# Patient Record
Sex: Female | Born: 1970
Health system: Southern US, Community
[De-identification: ages and names within clinical notes are randomized; demographics above are authoritative.]

## PROBLEM LIST (undated history)

## (undated) DIAGNOSIS — M222X2 Patellofemoral disorders, left knee: Secondary | ICD-10-CM

## (undated) DIAGNOSIS — F4310 Post-traumatic stress disorder, unspecified: Secondary | ICD-10-CM

## (undated) DIAGNOSIS — M5020 Other cervical disc displacement, unspecified cervical region: Secondary | ICD-10-CM

## (undated) DIAGNOSIS — M222X1 Patellofemoral disorders, right knee: Secondary | ICD-10-CM

## (undated) DIAGNOSIS — M722 Plantar fascial fibromatosis: Secondary | ICD-10-CM

## (undated) DIAGNOSIS — O24419 Gestational diabetes mellitus in pregnancy, unspecified control: Secondary | ICD-10-CM

## (undated) DIAGNOSIS — G4730 Sleep apnea, unspecified: Secondary | ICD-10-CM

## (undated) DIAGNOSIS — R519 Headache, unspecified: Secondary | ICD-10-CM

## (undated) DIAGNOSIS — R51 Headache: Secondary | ICD-10-CM

## (undated) DIAGNOSIS — K219 Gastro-esophageal reflux disease without esophagitis: Secondary | ICD-10-CM

## (undated) DIAGNOSIS — M797 Fibromyalgia: Secondary | ICD-10-CM

## (undated) DIAGNOSIS — D219 Benign neoplasm of connective and other soft tissue, unspecified: Secondary | ICD-10-CM

## (undated) DIAGNOSIS — G43909 Migraine, unspecified, not intractable, without status migrainosus: Secondary | ICD-10-CM

## (undated) DIAGNOSIS — B999 Unspecified infectious disease: Secondary | ICD-10-CM

## (undated) DIAGNOSIS — G8929 Other chronic pain: Secondary | ICD-10-CM

## (undated) DIAGNOSIS — D649 Anemia, unspecified: Secondary | ICD-10-CM

## (undated) DIAGNOSIS — O139 Gestational [pregnancy-induced] hypertension without significant proteinuria, unspecified trimester: Secondary | ICD-10-CM

## (undated) DIAGNOSIS — F419 Anxiety disorder, unspecified: Secondary | ICD-10-CM

## (undated) HISTORY — PX: CHOLECYSTECTOMY: SHX55

## (undated) HISTORY — DX: Sleep apnea, unspecified: G47.30

## (undated) HISTORY — DX: Post-traumatic stress disorder, unspecified: F43.10

## (undated) HISTORY — PX: TONSILLECTOMY: SUR1361

## (undated) HISTORY — PX: APPENDECTOMY: SHX54

## (undated) HISTORY — DX: Patellofemoral disorders, left knee: M22.2X2

## (undated) HISTORY — PX: DILATION AND CURETTAGE OF UTERUS: SHX78

## (undated) HISTORY — DX: Gestational (pregnancy-induced) hypertension without significant proteinuria, unspecified trimester: O13.9

## (undated) HISTORY — DX: Plantar fascial fibromatosis: M72.2

## (undated) HISTORY — PX: LIPOSUCTION MULTIPLE BODY PARTS: SUR832

## (undated) HISTORY — DX: Anxiety disorder, unspecified: F41.9

## (undated) HISTORY — DX: Patellofemoral disorders, right knee: M22.2X1

---
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
## 2007-03-26 HISTORY — PX: BREAST REDUCTION SURGERY: SHX8

## 2008-03-25 DIAGNOSIS — D259 Leiomyoma of uterus, unspecified: Secondary | ICD-10-CM | POA: Insufficient documentation

## 2015-05-27 ENCOUNTER — Emergency Department (HOSPITAL_COMMUNITY)
Admission: EM | Admit: 2015-05-27 | Discharge: 2015-05-28 | Disposition: A | Discharge status: Home / Self Care | Payer: 59 | Attending: Emergency Medicine | Admitting: Emergency Medicine

## 2015-05-27 ENCOUNTER — Emergency Department (HOSPITAL_COMMUNITY): Payer: 59

## 2015-05-27 ENCOUNTER — Encounter (HOSPITAL_COMMUNITY): Payer: Self-pay

## 2015-05-27 DIAGNOSIS — Y9389 Activity, other specified: Secondary | ICD-10-CM | POA: Insufficient documentation

## 2015-05-27 DIAGNOSIS — Z8739 Personal history of other diseases of the musculoskeletal system and connective tissue: Secondary | ICD-10-CM | POA: Insufficient documentation

## 2015-05-27 DIAGNOSIS — R55 Syncope and collapse: Secondary | ICD-10-CM

## 2015-05-27 DIAGNOSIS — G8929 Other chronic pain: Secondary | ICD-10-CM | POA: Insufficient documentation

## 2015-05-27 DIAGNOSIS — W01198A Fall on same level from slipping, tripping and stumbling with subsequent striking against other object, initial encounter: Secondary | ICD-10-CM | POA: Insufficient documentation

## 2015-05-27 DIAGNOSIS — S0990XA Unspecified injury of head, initial encounter: Secondary | ICD-10-CM

## 2015-05-27 DIAGNOSIS — Y9289 Other specified places as the place of occurrence of the external cause: Secondary | ICD-10-CM | POA: Insufficient documentation

## 2015-05-27 DIAGNOSIS — Z8679 Personal history of other diseases of the circulatory system: Secondary | ICD-10-CM | POA: Insufficient documentation

## 2015-05-27 DIAGNOSIS — S022XXA Fracture of nasal bones, initial encounter for closed fracture: Secondary | ICD-10-CM

## 2015-05-27 DIAGNOSIS — Y998 Other external cause status: Secondary | ICD-10-CM | POA: Insufficient documentation

## 2015-05-27 HISTORY — DX: Other chronic pain: G89.29

## 2015-05-27 HISTORY — DX: Fibromyalgia: M79.7

## 2015-05-27 HISTORY — DX: Headache: R51

## 2015-05-27 HISTORY — DX: Headache, unspecified: R51.9

## 2015-05-27 LAB — I-STAT CHEM 8, ED
BUN: 19 mg/dL (ref 6–20)
Calcium, Ion: 1.18 mmol/L (ref 1.12–1.23)
Chloride: 102 mmol/L (ref 101–111)
Creatinine, Ser: 1 mg/dL (ref 0.44–1.00)
Glucose, Bld: 96 mg/dL (ref 65–99)
HCT: 41 % (ref 36.0–46.0)
Hemoglobin: 13.9 g/dL (ref 12.0–15.0)
Potassium: 3.8 mmol/L (ref 3.5–5.1)
Sodium: 140 mmol/L (ref 135–145)
TCO2: 25 mmol/L (ref 0–100)

## 2015-05-27 LAB — COMPREHENSIVE METABOLIC PANEL
ALT: 18 U/L (ref 14–54)
AST: 21 U/L (ref 15–41)
Albumin: 4.2 g/dL (ref 3.5–5.0)
Alkaline Phosphatase: 85 U/L (ref 38–126)
Anion gap: 13 (ref 5–15)
BUN: 18 mg/dL (ref 6–20)
CO2: 25 mmol/L (ref 22–32)
Calcium: 9.5 mg/dL (ref 8.9–10.3)
Chloride: 102 mmol/L (ref 101–111)
Creatinine, Ser: 1.03 mg/dL — ABNORMAL HIGH (ref 0.44–1.00)
GFR calc Af Amer: >60 mL/min (ref >60)
GFR calc non Af Amer: >60 mL/min (ref >60)
Glucose, Bld: 96 mg/dL (ref 65–99)
Potassium: 3.8 mmol/L (ref 3.5–5.1)
Sodium: 140 mmol/L (ref 135–145)
Total Bilirubin: 0.9 mg/dL (ref 0.3–1.2)
Total Protein: 7.3 g/dL (ref 6.5–8.1)

## 2015-05-27 LAB — CBC
HCT: 38.5 % (ref 36.0–46.0)
Hemoglobin: 12.7 g/dL (ref 12.0–15.0)
MCH: 28.7 pg (ref 26.0–34.0)
MCHC: 33 g/dL (ref 30.0–36.0)
MCV: 87.1 fL (ref 78.0–100.0)
Platelets: 302 10*3/uL (ref 150–400)
RBC: 4.42 MIL/uL (ref 3.87–5.11)
RDW: 14.1 % (ref 11.5–15.5)
WBC: 9.1 10*3/uL (ref 4.0–10.5)

## 2015-05-27 LAB — DIFFERENTIAL
Basophils Absolute: 0 10*3/uL (ref 0.0–0.1)
Basophils Relative: 0 %
Eosinophils Absolute: 0.1 10*3/uL (ref 0.0–0.7)
Eosinophils Relative: 1 %
Lymphocytes Relative: 15 %
Lymphs Abs: 1.4 10*3/uL (ref 0.7–4.0)
Monocytes Absolute: 0.6 10*3/uL (ref 0.1–1.0)
Monocytes Relative: 6 %
Neutro Abs: 7.1 10*3/uL (ref 1.7–7.7)
Neutrophils Relative %: 78 %

## 2015-05-27 LAB — I-STAT TROPONIN, ED: Troponin i, poc: 0 ng/mL (ref 0.00–0.08)

## 2015-05-27 LAB — PROTIME-INR
INR: 1.06 (ref 0.00–1.49)
Prothrombin Time: 14 seconds (ref 11.6–15.2)

## 2015-05-27 LAB — APTT: aPTT: 27 seconds (ref 24–37)

## 2015-05-27 MED ORDER — IBUPROFEN 800 MG PO TABS
800.0000 mg | ORAL_TABLET | Freq: Once | ORAL | Status: AC
  Administered 2015-05-27: 800 mg via ORAL
  Filled 2015-05-27: qty 1

## 2015-05-27 NOTE — ED Notes (Signed)
Pt returned from CT °

## 2015-05-27 NOTE — ED Notes (Signed)
EKG given to Dr. Delo 

## 2015-05-27 NOTE — ED Notes (Signed)
Pt ambulated to the bathroom with standby assistance, pt felt a little dizzy and also stated she had a headache

## 2015-05-27 NOTE — ED Notes (Signed)
Pt verbalized understanding of d/c instructions and has no further questions. Pt stable and NAD.  

## 2015-05-27 NOTE — ED Provider Notes (Signed)
CSN: [id number]     Arrival date & time 05/27/15  1922 History   First MD Initiated Contact with Patient 05/27/15 1935     Chief Complaint  Patient presents with  . Dizziness     (Consider location/radiation/quality/duration/timing/severity/associated sxs/prior Treatment) HPI Comments: Patient is a 45 year old female with history of fibromyalgia and migraine headaches. She presents for evaluation of dizzy episodes that of been occurring for the past several months. She experienced another episode this afternoon, then lost her balance, fell forward, and struck her face on the floor. She had bleeding from her nose which was eventually controlled with direct pressure. She is here complaining of headache and nose pain.  Patient is a 45 y.o. female presenting with dizziness. The history is provided by the patient.  Dizziness Quality:  Lightheadedness and imbalance Severity:  Moderate Timing:  Constant Progression:  Unchanged Chronicity:  Recurrent Relieved by:  Nothing Worsened by:  Nothing Ineffective treatments:  None tried   Past Medical History  Diagnosis Date  . Fibromyalgia   . Chronic headaches    Past Surgical History  Procedure Laterality Date  . Cholecystectomy    . Appendectomy     History reviewed. No pertinent family history. Social History  Substance Use Topics  . Smoking status: Never Smoker   . Smokeless tobacco: None  . Alcohol Use: No   OB History    No data available     Review of Systems  Neurological: Positive for dizziness.  All other systems reviewed and are negative.     Allergies  Review of patient's allergies indicates no known allergies.  Home Medications   Prior to Admission medications   Not on File   BP 140/99 mmHg  Pulse 109  Temp(Src) 97.8 F (36.6 C) (Oral)  Resp 16  Ht 5\' 5"  (1.651 m)  Wt 190 lb 12.8 oz (86.546 kg)  BMI 31.75 kg/m2  SpO2 99%  LMP 05/22/2015 Physical Exam  Constitutional: She is oriented to person,  place, and time. She appears well-developed and well-nourished. No distress.  HENT:  Head: Normocephalic and atraumatic.  Mouth/Throat: Oropharynx is clear and moist.  The septum is midline with no evidence for hematoma. There is a slight amount of residual blood in the left nares, however no active bleeding.  Eyes: EOM are normal. Pupils are equal, round, and reactive to light.  Neck: Normal range of motion. Neck supple.  Cardiovascular: Normal rate and regular rhythm.  Exam reveals no gallop and no friction rub.   No murmur heard. Pulmonary/Chest: Effort normal and breath sounds normal. No respiratory distress. She has no wheezes.  Abdominal: Soft. Bowel sounds are normal. She exhibits no distension. There is no tenderness.  Musculoskeletal: Normal range of motion.  Neurological: She is alert and oriented to person, place, and time. No cranial nerve deficit. She exhibits normal muscle tone. Coordination normal.  Skin: Skin is warm and dry. She is not diaphoretic.  Nursing note and vitals reviewed.   ED Course  Procedures (including critical care time) Labs Review Labs Reviewed  PROTIME-INR  APTT  CBC  DIFFERENTIAL  COMPREHENSIVE METABOLIC PANEL  I-STAT TROPOININ, ED  CBG MONITORING, ED  I-STAT CHEM 8, ED    Imaging Review No results found. I have personally reviewed and evaluated these images and lab results as part of my medical decision-making.   EKG Interpretation   Date/Time:  Saturday May 27 2015 19:35:01 EST Ventricular Rate:  111 PR Interval:  160 QRS Duration: 76 QT Interval:  336 QTC Calculation: 456 R Axis:   10 Text Interpretation:  Sinus tachycardia Low voltage QRS Borderline ECG  Confirmed by Wei Newbrough  MD, Maebel Marasco ([id number]) on 05/27/2015 7:41:50 PM      MDM   Final diagnoses:  None    Patient presents here with complaints of weakness and occasional dizzy episodes over the past several weeks. This evening she had another episode which caused her to pass  out. She struck her nose on the floor and has a nondisplaced nasal bone fracture. Her head CT is otherwise negative. Laboratory studies reveal no obvious cause for this. She is to follow-up with her primary Dr. at the [facility] to discuss further workup into these episodes.    Veryl Speak, MD 05/27/15 2337

## 2015-05-27 NOTE — Discharge Instructions (Signed)
Ibuprofen 600 g every 6 hours as needed for pain.  Follow-up with your primary Dr. to discuss these episodes, and return to the ER if symptoms significantly worsen or change.   Syncope Syncope is a medical term for fainting or passing out. This means you lose consciousness and drop to the ground. People are generally unconscious for less than 5 minutes. You may have some muscle twitches for up to 15 seconds before waking up and returning to normal. Syncope occurs more often in older adults, but it can happen to anyone. While most causes of syncope are not dangerous, syncope can be a sign of a serious medical problem. It is important to seek medical care.  CAUSES  Syncope is caused by a sudden drop in blood flow to the brain. The specific cause is often not determined. Factors that can bring on syncope include:  Taking medicines that lower blood pressure.  Sudden changes in posture, such as standing up quickly.  Taking more medicine than prescribed.  Standing in one place for too long.  Seizure disorders.  Dehydration and excessive exposure to heat.  Low blood sugar (hypoglycemia).  Straining to have a bowel movement.  Heart disease, irregular heartbeat, or other circulatory problems.  Fear, emotional distress, seeing blood, or severe pain. SYMPTOMS  Right before fainting, you may:  Feel dizzy or light-headed.  Feel nauseous.  See all white or all black in your field of vision.  Have cold, clammy skin. DIAGNOSIS  Your health care provider will ask about your symptoms, perform a physical exam, and perform an electrocardiogram (ECG) to record the electrical activity of your heart. Your health care provider may also perform other heart or blood tests to determine the cause of your syncope which may include:  Transthoracic echocardiogram (TTE). During echocardiography, sound waves are used to evaluate how blood flows through your heart.  Transesophageal echocardiogram  (TEE).  Cardiac monitoring. This allows your health care provider to monitor your heart rate and rhythm in real time.  Holter monitor. This is a portable device that records your heartbeat and can help diagnose heart arrhythmias. It allows your health care provider to track your heart activity for several days, if needed.  Stress tests by exercise or by giving medicine that makes the heart beat faster. TREATMENT  In most cases, no treatment is needed. Depending on the cause of your syncope, your health care provider may recommend changing or stopping some of your medicines. HOME CARE INSTRUCTIONS  Have someone stay with you until you feel stable.  Do not drive, use machinery, or play sports until your health care provider says it is okay.  Keep all follow-up appointments as directed by your health care provider.  Lie down right away if you start feeling like you might faint. Breathe deeply and steadily. Wait until all the symptoms have passed.  Drink enough fluids to keep your urine clear or pale yellow.  If you are taking blood pressure or heart medicine, get up slowly and take several minutes to sit and then stand. This can reduce dizziness. SEEK IMMEDIATE MEDICAL CARE IF:   You have a severe headache.  You have unusual pain in the chest, abdomen, or back.  You are bleeding from your mouth or rectum, or you have black or tarry stool.  You have an irregular or very fast heartbeat.  You have pain with breathing.  You have repeated fainting or seizure-like jerking during an episode.  You faint when sitting or lying down.  You have confusion.  You have trouble walking.  You have severe weakness.  You have vision problems. If you fainted, call your local emergency services (911 in [place] Do not drive yourself to the hospital.    This information is not intended to replace advice given to you by your health care provider. Make sure you discuss any questions you have with  your health care provider.   Document Released: 03/11/2005 Document Revised: 07/26/2014 Document Reviewed: 05/10/2011 Elsevier Interactive Patient Education 2016 Elsevier [employer]  Nasal Fracture A nasal fracture is a break or crack in the bones or cartilage of the nose. Minor breaks do not require treatment. These breaks usually heal on their own after about one month. Serious breaks may require surgery. CAUSES This injury is usually caused by a blunt injury to the nose. This type of injury often occurs from:  Contact sports.  Car accidents.  Falls.  Getting punched. SYMPTOMS Symptoms of this injury include:  Pain.  Swelling of the nose.  Bleeding from the nose.  Bruising around the nose or eyes. This may include having black eyes.  Crooked appearance of the nose. DIAGNOSIS This injury may be diagnosed with a physical exam. The health care provider will gently feel the nose for signs of broken bones. He or she will look inside the nostrils to make sure that there is not a blood-filled swelling on the dividing wall between the nostrils (septal hematoma). X-rays of the nose may not show a nasal fracture even when one is present. In some cases, X-rays or a CT scan may be done 1-5 days after the injury. Sometimes, the health care provider will want to wait until the swelling has gone down. TREATMENT Often, minor fractures that have caused no deformity do not require treatment. More serious fractures in which bones have moved out of position may require surgery, which will take place after the swelling is gone. Surgery will stabilize and align the fracture. In some cases, a health care provider may be able to reposition the bones without surgery. This may be done in the health care provider's office after medicine is given to numb the area (local anesthetic). HOME CARE INSTRUCTIONS  If directed, apply ice to the injured area:  Put ice in a plastic bag.  Place a towel between your skin  and the bag.  Leave the ice on for 20 minutes, 2-3 times per day.  Take over-the-counter and prescription medicines only as told by your health care provider.  If your nose starts to bleed, sit in an upright position while you squeeze the soft parts of your nose against the dividing wall between your nostrils (septum) for 10 minutes.  Try to avoid blowing your nose.  Return to your normal activities as told by your health care provider. Ask your health care provider what activities are safe for you.  Avoid contact sports for 3-4 weeks or as told by your health care provider.  Keep all follow-up visits as told by your health care provider. This is important. SEEK MEDICAL CARE IF:  Your pain increases or becomes severe.  You continue to have nosebleeds.  The shape of your nose does not return to normal within 5 days.  You have pus draining out of your nose. SEEK IMMEDIATE MEDICAL CARE IF:  You have bleeding from your nose that does not stop after you pinch your nostrils closed for 20 minutes and keep ice on your nose.  You have clear fluid draining out of  your nose.  You notice a grape-like swelling on the septum. This swelling is a collection of blood (hematoma) that must be drained to help prevent infection.  You have difficulty moving your eyes.  You have repeated vomiting.   This information is not intended to replace advice given to you by your health care provider. Make sure you discuss any questions you have with your health care provider.   Document Released: 03/08/2000 Document Revised: 11/30/2014 Document Reviewed: 04/18/2014 Elsevier Interactive Patient Education [employer].

## 2015-05-27 NOTE — ED Notes (Signed)
Pt reports onset 45 min PTA pt was in bathroom, got dizzy, fell and hit nose on floor, nose bled x 35 min with holding pressure.  Pt reports has had dizziness and headaches x several months, pt has been seen in [place] for same.   Hand grips equal, pt has normal gait, no facial grimace.

## 2016-10-17 ENCOUNTER — Emergency Department (HOSPITAL_COMMUNITY): Payer: No Typology Code available for payment source

## 2016-10-17 ENCOUNTER — Encounter (HOSPITAL_COMMUNITY): Payer: Self-pay

## 2016-10-17 ENCOUNTER — Emergency Department (HOSPITAL_COMMUNITY)
Admission: EM | Admit: 2016-10-17 | Discharge: 2016-10-17 | Disposition: A | Discharge status: Home / Self Care | Payer: No Typology Code available for payment source | Attending: Emergency Medicine | Admitting: Emergency Medicine

## 2016-10-17 DIAGNOSIS — R5383 Other fatigue: Secondary | ICD-10-CM | POA: Insufficient documentation

## 2016-10-17 DIAGNOSIS — R51 Headache: Secondary | ICD-10-CM | POA: Diagnosis present

## 2016-10-17 DIAGNOSIS — R079 Chest pain, unspecified: Secondary | ICD-10-CM | POA: Insufficient documentation

## 2016-10-17 DIAGNOSIS — R519 Headache, unspecified: Secondary | ICD-10-CM

## 2016-10-17 DIAGNOSIS — R0789 Other chest pain: Secondary | ICD-10-CM

## 2016-10-17 DIAGNOSIS — M79602 Pain in left arm: Secondary | ICD-10-CM | POA: Insufficient documentation

## 2016-10-17 DIAGNOSIS — R202 Paresthesia of skin: Secondary | ICD-10-CM | POA: Insufficient documentation

## 2016-10-17 DIAGNOSIS — R42 Dizziness and giddiness: Secondary | ICD-10-CM | POA: Diagnosis not present

## 2016-10-17 HISTORY — DX: Other cervical disc displacement, unspecified cervical region: M50.20

## 2016-10-17 LAB — CBC
HCT: 39.2 % (ref 36.0–46.0)
Hemoglobin: 13.4 g/dL (ref 12.0–15.0)
MCH: 29 pg (ref 26.0–34.0)
MCHC: 34.2 g/dL (ref 30.0–36.0)
MCV: 84.8 fL (ref 78.0–100.0)
Platelets: 290 10*3/uL (ref 150–400)
RBC: 4.62 MIL/uL (ref 3.87–5.11)
RDW: 14.4 % (ref 11.5–15.5)
WBC: 10.2 10*3/uL (ref 4.0–10.5)

## 2016-10-17 LAB — BASIC METABOLIC PANEL
Anion gap: 10 (ref 5–15)
BUN: 9 mg/dL (ref 6–20)
CO2: 25 mmol/L (ref 22–32)
Calcium: 9.2 mg/dL (ref 8.9–10.3)
Chloride: 101 mmol/L (ref 101–111)
Creatinine, Ser: 0.72 mg/dL (ref 0.44–1.00)
GFR calc Af Amer: >60 mL/min (ref >60)
GFR calc non Af Amer: >60 mL/min (ref >60)
Glucose, Bld: 99 mg/dL (ref 65–99)
Potassium: 3.8 mmol/L (ref 3.5–5.1)
Sodium: 136 mmol/L (ref 135–145)

## 2016-10-17 LAB — URINALYSIS, ROUTINE W REFLEX MICROSCOPIC
Bilirubin Urine: NEGATIVE
Glucose, UA: NEGATIVE mg/dL
Hgb urine dipstick: NEGATIVE
Ketones, ur: NEGATIVE mg/dL
Leukocytes, UA: NEGATIVE
Nitrite: NEGATIVE
Protein, ur: NEGATIVE mg/dL
Specific Gravity, Urine: 1.019 (ref 1.005–1.030)
pH: 5 (ref 5.0–8.0)

## 2016-10-17 LAB — I-STAT TROPONIN, ED: Troponin i, poc: 0 ng/mL (ref 0.00–0.08)

## 2016-10-17 MED ORDER — SODIUM CHLORIDE 0.9 % IV BOLUS (SEPSIS)
1000.0000 mL | Freq: Once | INTRAVENOUS | Status: AC
  Administered 2016-10-17: 1000 mL via INTRAVENOUS

## 2016-10-17 MED ORDER — PROCHLORPERAZINE EDISYLATE 5 MG/ML IJ SOLN
10.0000 mg | Freq: Once | INTRAMUSCULAR | Status: AC
  Administered 2016-10-17: 10 mg via INTRAVENOUS
  Filled 2016-10-17: qty 2

## 2016-10-17 MED ORDER — DIPHENHYDRAMINE HCL 50 MG/ML IJ SOLN
25.0000 mg | Freq: Once | INTRAMUSCULAR | Status: AC
  Administered 2016-10-17: 25 mg via INTRAVENOUS
  Filled 2016-10-17: qty 1

## 2016-10-17 MED ORDER — BUTALBITAL-APAP-CAFFEINE 50-325-40 MG PO TABS: 1.0000 | ORAL_TABLET | Freq: Four times a day (QID) | ORAL | 0 refills | Status: AC | PRN

## 2016-10-17 MED ORDER — KETOROLAC TROMETHAMINE 30 MG/ML IJ SOLN
30.0000 mg | Freq: Once | INTRAMUSCULAR | Status: AC
  Administered 2016-10-17: 30 mg via INTRAVENOUS
  Filled 2016-10-17: qty 1

## 2016-10-17 NOTE — Discharge Instructions (Signed)
Return here as needed.  Follow-up with your neurologist

## 2016-10-17 NOTE — ED Triage Notes (Addendum)
Per Pt, Pt is coming from home with complaints of head pressure, left chest pressure that radiates down her left arm, and right flank pain that has been going on for six months. Reports some nausea and dizziness with fatigue and loss of appetite. Pt reports some episodes of bilateral leg tingling. Pt is alert and oriented x4 upon arrival to the ED.

## 2016-10-29 NOTE — ED Provider Notes (Signed)
[facility] DEPT Provider Note   CSN: [id number] Arrival date & time: 10/17/16  1624     History   Chief Complaint Chief Complaint  Patient presents with  . Chest Pain  . Headache    HPI Melissa Medina is a 46 y.o. female.  HPI Patient presents to the emergency department with bilateral head pressure left chest pressure, left arm pain, right flank pain has been ongoing for about 6 months.  Patient also states she has had nausea with dizziness and fatigue, loss of appetite.  Patient states that tingling in both lower extremities at times.  Patient states that she has been referred to neurologist.  Patient states nothing seems make the condition better or worse.  She states she did not take any medications prior to arrival. The patient denies  shortness of breath, headache,blurred vision, neck pain, fever, cough, dizziness, anorexia, edema, abdominal pain, , vomiting, diarrhea, rash, back pain, dysuria, hematemesis, bloody stool, near syncope, or syncope. Past Medical History:  Diagnosis Date  . Chronic headaches   . Fibromyalgia   . Herniated disc, cervical     There are no active problems to display for this patient.   Past Surgical History:  Procedure Laterality Date  . APPENDECTOMY    . CHOLECYSTECTOMY      OB History    No data available       Home Medications    Prior to Admission medications   Medication Sig Start Date End Date Taking? Authorizing Provider  ibuprofen (ADVIL,MOTRIN) 200 MG tablet Take 200-400 mg by mouth every 6 (six) hours as needed (for headaches).   Yes [provider]  butalbital-acetaminophen-caffeine (FIORICET, ESGIC) [phone] MG tablet Take 1-2 tablets by mouth every 6 (six) hours as needed for headache. 10/17/16 10/17/17  Dalia Heading, PA-C    Family History No family history on file.  Social History Social History  Substance Use Topics  . Smoking status: Never Smoker  . Smokeless tobacco: Never Used  . Alcohol  use No     Allergies   Patient has no known allergies.   Review of Systems Review of Systems  All other systems negative except as documented in the HPI. All pertinent positives and negatives as reviewed in the HPI. Physical Exam Updated Vital Signs BP 120/86   Pulse 78   Temp 97.6 F (36.4 C) (Oral)   Resp 11   Ht 5\' 5"  (1.651 m)   Wt 83.9 kg (185 lb)   LMP 10/03/2016   SpO2 100%   BMI 30.79 kg/m   Physical Exam  Constitutional: She is oriented to person, place, and time. She appears well-developed and well-nourished. No distress.  HENT:  Head: Normocephalic and atraumatic.  Mouth/Throat: Oropharynx is clear and moist.  Eyes: Pupils are equal, round, and reactive to light.  Neck: Normal range of motion. Neck supple.  Cardiovascular: Normal rate, regular rhythm and normal heart sounds.  Exam reveals no gallop and no friction rub.   No murmur heard. Pulmonary/Chest: Effort normal and breath sounds normal. No respiratory distress. She has no wheezes.  Abdominal: Soft. Bowel sounds are normal. She exhibits no distension. There is no tenderness.  Neurological: She is alert and oriented to person, place, and time. No sensory deficit. She exhibits normal muscle tone. Coordination normal.  Skin: Skin is warm and dry. Capillary refill takes less than 2 seconds. No rash noted. No erythema.  Psychiatric: She has a normal mood and affect. Her behavior is normal.  Nursing note and  vitals reviewed.    ED Treatments / Results  Labs (all labs ordered are listed, but only abnormal results are displayed) Labs Reviewed  BASIC METABOLIC PANEL  CBC  URINALYSIS, ROUTINE W REFLEX MICROSCOPIC  I-STAT TROPONIN, ED    EKG  EKG Interpretation  Date/Time:  Thursday October 17 2016 16:42:20 EDT Ventricular Rate:  95 PR Interval:  156 QRS Duration: 78 QT Interval:  350 QTC Calculation: 439 R Axis:   -102 Text Interpretation:  Normal sinus rhythm Indeterminate axis Borderline ECG  Confirmed by Ripley Fraise [phone]) on 10/18/2016 10:32:00 AM       Radiology No results found.  Procedures Procedures (including critical care time)  Medications Ordered in ED Medications  sodium chloride 0.9 % bolus 1,000 mL (0 mLs Intravenous Stopped 10/17/16 2305)  ketorolac (TORADOL) 30 MG/ML injection 30 mg (30 mg Intravenous Given 10/17/16 2210)  prochlorperazine (COMPAZINE) injection 10 mg (10 mg Intravenous Given 10/17/16 2218)  diphenhydrAMINE (BENADRYL) injection 25 mg (25 mg Intravenous Given 10/17/16 2217)     Initial Impression / Assessment and Plan / ED Course  I have reviewed the triage vital signs and the nursing notes.  Pertinent labs & imaging results that were available during my care of the patient were reviewed by me and considered in my medical decision making (see chart for details).    Patient will need neurological assessment is an outpatient.  She will most likely need further evaluation of her multiple complaints is been ongoing for over 6 months.  Patient is advised that this could be related to several different neurological conditions.  I did advise her she needed to return here for any worsening in her condition.  Patient agrees the plan and all questions were answered  Final Clinical Impressions(s) / ED Diagnoses   Final diagnoses:  Bad headache  Atypical chest pain  Dizziness    New Prescriptions Discharge Medication List as of 10/17/2016 10:54 PM    START taking these medications   Details  butalbital-acetaminophen-caffeine (FIORICET, ESGIC) 50-325-40 MG tablet Take 1-2 tablets by mouth every 6 (six) hours as needed for headache., Starting Thu 10/17/2016, Until Fri 10/17/2017, Print         Ticara Waner, [place], PA-C 10/29/16 0123    Charlesetta Shanks, MD 11/10/16 1712

## 2016-11-13 ENCOUNTER — Encounter (HOSPITAL_COMMUNITY): Payer: Self-pay | Admitting: Emergency Medicine

## 2016-11-13 ENCOUNTER — Ambulatory Visit (HOSPITAL_COMMUNITY)
Admission: EM | Admit: 2016-11-13 | Discharge: 2016-11-13 | Disposition: A | Discharge status: Home / Self Care | Payer: No Typology Code available for payment source | Attending: Family Medicine | Admitting: Family Medicine

## 2016-11-13 DIAGNOSIS — S61209A Unspecified open wound of unspecified finger without damage to nail, initial encounter: Secondary | ICD-10-CM

## 2016-11-13 MED ORDER — IBUPROFEN 200 MG PO TABS: 200.0000 mg | ORAL_TABLET | Freq: Four times a day (QID) | ORAL | 0 refills | Status: DC | PRN

## 2016-11-13 NOTE — ED Provider Notes (Signed)
  Page   [id number] 11/13/16 Arrival Time: 0301  ASSESSMENT & PLAN:  1. Fingertip avulsion, initial encounter     Meds ordered this encounter  Medications  . ibuprofen (ADVIL,MOTRIN) 200 MG tablet    Sig: Take 1-2 tablets (200-400 mg total) by mouth every 6 (six) hours as needed for moderate pain.    Dispense:  30 tablet    Refill:  0   Surgicel applied to fingertip after cleaning. Pressure bandage. Will remove in 24 hours at home. She may return here if she desires for ust to remove. Minimal bleeding in office. OTC analgesics as needed. Work note given with restrictions.  Reviewed expectations re: course of current medical issues. Questions answered. Outlined signs and symptoms indicating need for more acute intervention. Patient verbalized understanding. After Visit Summary given.   SUBJECTIVE:  Melissa Medina is a 46 y.o. female who presents with a R 5th fingertip avulsion. Cutting vegetables this morning and sliced fingertip. Moderate discomfort - "throbbing". Trouble getting the bleeding to stop.  Td UTD: Will check her [facility] records.  ROS: As per HPI.   OBJECTIVE:  Vitals:   11/13/16 1654  BP: 131/83  Pulse: 81  Resp: 16  Temp: 98.3 F (36.8 C)  TempSrc: Oral  SpO2: 97%  Weight: 189 lb (85.7 kg)     General appearance: alert; no distress Skin: R 5th fingertip with approx 6-60mm avulsion; no nail involvement; no exposed bone; minimal bleeding Psychological:  alert and cooperative; normal mood and affect   No Known Allergies  Past Medical History:  Diagnosis Date  . Chronic headaches   . Fibromyalgia   . Herniated disc, cervical        Vanessa Kick, MD 11/15/16 1215

## 2016-11-13 NOTE — ED Triage Notes (Signed)
PT was slicing vegetables with a machine this morning and sliced her right fifth finger. This happened at 0730.PT reports bleeding has not stopped since this AM.

## 2017-01-08 DIAGNOSIS — G894 Chronic pain syndrome: Secondary | ICD-10-CM | POA: Insufficient documentation

## 2017-11-21 DIAGNOSIS — F419 Anxiety disorder, unspecified: Secondary | ICD-10-CM | POA: Insufficient documentation

## 2017-11-21 DIAGNOSIS — M797 Fibromyalgia: Secondary | ICD-10-CM | POA: Insufficient documentation

## 2017-11-28 DIAGNOSIS — Z8659 Personal history of other mental and behavioral disorders: Secondary | ICD-10-CM | POA: Insufficient documentation

## 2018-06-15 ENCOUNTER — Other Ambulatory Visit: Payer: Self-pay | Admitting: Family Medicine

## 2018-06-15 DIAGNOSIS — Z0189 Encounter for other specified special examinations: Secondary | ICD-10-CM

## 2018-07-16 ENCOUNTER — Ambulatory Visit (HOSPITAL_COMMUNITY): Payer: No Typology Code available for payment source

## 2018-07-16 ENCOUNTER — Encounter (HOSPITAL_COMMUNITY): Payer: Self-pay

## 2018-08-10 ENCOUNTER — Emergency Department (HOSPITAL_COMMUNITY): Payer: No Typology Code available for payment source

## 2018-08-10 ENCOUNTER — Encounter (HOSPITAL_COMMUNITY): Payer: Self-pay

## 2018-08-10 ENCOUNTER — Other Ambulatory Visit: Payer: Self-pay

## 2018-08-10 ENCOUNTER — Emergency Department (HOSPITAL_COMMUNITY)
Admission: EM | Admit: 2018-08-10 | Discharge: 2018-08-10 | Disposition: A | Discharge status: Home / Self Care | Payer: No Typology Code available for payment source | Attending: Emergency Medicine | Admitting: Emergency Medicine

## 2018-08-10 DIAGNOSIS — D219 Benign neoplasm of connective and other soft tissue, unspecified: Secondary | ICD-10-CM | POA: Diagnosis not present

## 2018-08-10 DIAGNOSIS — N938 Other specified abnormal uterine and vaginal bleeding: Secondary | ICD-10-CM | POA: Diagnosis present

## 2018-08-10 DIAGNOSIS — R102 Pelvic and perineal pain: Secondary | ICD-10-CM

## 2018-08-10 LAB — I-STAT BETA HCG BLOOD, ED (MC, WL, AP ONLY): I-stat hCG, quantitative: <5 m[IU]/mL (ref <5)

## 2018-08-10 LAB — COMPREHENSIVE METABOLIC PANEL
ALT: 24 U/L (ref 0–44)
AST: 23 U/L (ref 15–41)
Albumin: 3.8 g/dL (ref 3.5–5.0)
Alkaline Phosphatase: 89 U/L (ref 38–126)
Anion gap: 10 (ref 5–15)
BUN: 9 mg/dL (ref 6–20)
CO2: 23 mmol/L (ref 22–32)
Calcium: 8.9 mg/dL (ref 8.9–10.3)
Chloride: 104 mmol/L (ref 98–111)
Creatinine, Ser: 0.64 mg/dL (ref 0.44–1.00)
GFR calc Af Amer: >60 mL/min (ref >60)
GFR calc non Af Amer: >60 mL/min (ref >60)
Glucose, Bld: 92 mg/dL (ref 70–99)
Potassium: 3.6 mmol/L (ref 3.5–5.1)
Sodium: 137 mmol/L (ref 135–145)
Total Bilirubin: 0.8 mg/dL (ref 0.3–1.2)
Total Protein: 6.6 g/dL (ref 6.5–8.1)

## 2018-08-10 LAB — CBC WITH DIFFERENTIAL/PLATELET
Abs Immature Granulocytes: 0.02 10*3/uL (ref 0.00–0.07)
Basophils Absolute: 0 10*3/uL (ref 0.0–0.1)
Basophils Relative: 1 %
Eosinophils Absolute: 0.2 10*3/uL (ref 0.0–0.5)
Eosinophils Relative: 2 %
HCT: 37.9 % (ref 36.0–46.0)
Hemoglobin: 12 g/dL (ref 12.0–15.0)
Immature Granulocytes: 0 %
Lymphocytes Relative: 27 %
Lymphs Abs: 2.2 10*3/uL (ref 0.7–4.0)
MCH: 26.3 pg (ref 26.0–34.0)
MCHC: 31.7 g/dL (ref 30.0–36.0)
MCV: 82.9 fL (ref 80.0–100.0)
Monocytes Absolute: 0.6 10*3/uL (ref 0.1–1.0)
Monocytes Relative: 7 %
Neutro Abs: 5.1 10*3/uL (ref 1.7–7.7)
Neutrophils Relative %: 63 %
Platelets: 300 10*3/uL (ref 150–400)
RBC: 4.57 MIL/uL (ref 3.87–5.11)
RDW: 15.8 % — ABNORMAL HIGH (ref 11.5–15.5)
WBC: 8.1 10*3/uL (ref 4.0–10.5)
nRBC: 0 % (ref 0.0–0.2)

## 2018-08-10 LAB — TYPE AND SCREEN
ABO/RH(D): O POS
Antibody Screen: NEGATIVE

## 2018-08-10 LAB — ABO/RH: ABO/RH(D): O POS

## 2018-08-10 MED ORDER — MEGESTROL ACETATE 40 MG PO TABS: 40.0000 mg | ORAL_TABLET | Freq: Every day | ORAL | 0 refills | Status: DC

## 2018-08-10 NOTE — ED Notes (Addendum)
Pt brought to exam room from Ultrasound.

## 2018-08-10 NOTE — Discharge Instructions (Signed)
Please take the medications prescribed to see if it helps stop the bleeding.  We recommend that you follow-up with a gynecologist in 7 days.  1 to the ER immediately if he start having worsening of the bleeding, or you start having episodes of dizziness, shortness of breath.

## 2018-08-10 NOTE — ED Triage Notes (Signed)
Pt reports heavy vaginal bleeding since May 10th. Reports large clots, changing her pad 8-9 times a day. Intermittent dizziness. Pt alert, nad noted

## 2018-08-10 NOTE — ED Provider Notes (Signed)
[facility] EMERGENCY DEPARTMENT Provider Note   CSN: [id number] Arrival date & time: 08/10/18  1511    History   Chief Complaint Chief Complaint  Patient presents with   Vaginal Bleeding    HPI Melissa Medina is a 48 y.o. female.     HPI  48 year old female comes in with chief complaint of vaginal bleeding.  Patient reports that for the last 7 to 10 days she has been having persistent bleeding.  She has been changing about 5-10 pads a day, which is normal for her to do on her heaviest day.  Her LMP is in mid March.  She did not have a period in April.  She denies any pelvic pain.  Patient has not sexually active for the last 3 months.  Past Medical History:  Diagnosis Date   Chronic headaches    Fibromyalgia    Herniated disc, cervical     There are no active problems to display for this patient.   Past Surgical History:  Procedure Laterality Date   APPENDECTOMY     CHOLECYSTECTOMY       OB History   No obstetric history on file.      Home Medications    Prior to Admission medications   Medication Sig Start Date End Date Taking? Authorizing Provider  ibuprofen (ADVIL,MOTRIN) 200 MG tablet Take 1-2 tablets (200-400 mg total) by mouth every 6 (six) hours as needed for moderate pain. 11/13/16   Vanessa Kick, MD    Family History No family history on file.  Social History Social History   Tobacco Use   Smoking status: Never Smoker   Smokeless tobacco: Never Used  Substance Use Topics   Alcohol use: No   Drug use: No     Allergies   Patient has no known allergies.   Review of Systems Review of Systems  Constitutional: Positive for activity change.  Respiratory: Negative for shortness of breath.   Genitourinary: Positive for vaginal bleeding.  Neurological: Negative for dizziness and syncope.  Hematological: Does not bruise/bleed easily.     Physical Exam Updated Vital Signs BP 116/75 (BP Location: Left Arm)     Pulse 80    Temp 98.2 F (36.8 C) (Oral)    Resp 16    SpO2 100%   Physical Exam Vitals signs and nursing note reviewed.  Constitutional:      Appearance: She is well-developed.  HENT:     Head: Normocephalic and atraumatic.  Neck:     Musculoskeletal: Normal range of motion and neck supple.  Cardiovascular:     Rate and Rhythm: Normal rate.  Pulmonary:     Effort: Pulmonary effort is normal.  Abdominal:     General: Bowel sounds are normal.  Skin:    General: Skin is warm and dry.  Neurological:     Mental Status: She is alert and oriented to person, place, and time.      ED Treatments / Results  Labs (all labs ordered are listed, but only abnormal results are displayed) Labs Reviewed  CBC WITH DIFFERENTIAL/PLATELET - Abnormal; Notable for the following components:      Result Value   RDW 15.8 (*)    All other components within normal limits  COMPREHENSIVE METABOLIC PANEL  I-STAT BETA HCG BLOOD, ED ([facility], [facility], [facility] ONLY)  TYPE AND SCREEN  ABO/RH    EKG None  Radiology US Pelvic Complete With Transvaginal  Result Date: 08/10/2018 CLINICAL DATA:  Pelvic pain and vaginal  bleeding for the past 2 weeks. EXAM: TRANSABDOMINAL AND TRANSVAGINAL ULTRASOUND OF PELVIS TECHNIQUE: Both transabdominal and transvaginal ultrasound examinations of the pelvis were performed. Transabdominal technique was performed for global imaging of the pelvis including uterus, ovaries, adnexal regions, and pelvic cul-de-sac. It was necessary to proceed with endovaginal exam following the transabdominal exam to visualize the ovaries. COMPARISON:  None. FINDINGS: Uterus Measurements: 9.9 x 5.9 x 6.8 cm = volume: 208 mL. Large 5.9 x 6.4 x 6.6 cm predominantly intramural fibroid at the uterine fundus with small submucosal component. Smaller intramural fibroid in the left posterior fundus, measuring 2.1 x 2.3 x 2.4 cm. Endometrium Thickness: 14 mm.  3 mm hyperechoic focus in the endometrial canal. Right ovary  Measurements: 2.0 x 1.4 x 1.8 cm = volume: 3 mL. Normal appearance/no adnexal mass. Left ovary Measurements: 4.4 x 3.4 x 2.8 cm = volume: 22 mL. Normal appearance/no adnexal mass. Two dominant follicles noted. Other findings No abnormal free fluid. IMPRESSION: 1. Fibroid uterus. 5.9 x 6.4 x 6.6 cm fundal fibroid appears to have a small submucosal component. 2. 3 mm hyperechoic focus within the endometrial canal could represent a polyp. Consider sonohysterogram for further evaluation, prior to hysteroscopy or endometrial biopsy. Electronically Signed   By: Titus Dubin M.D.   On: 08/10/2018 22:09    Procedures Procedures (including critical care time)  Medications Ordered in ED Medications - No data to display   Initial Impression / Assessment and Plan / ED Course  I have reviewed the triage vital signs and the nursing notes.  Pertinent labs & imaging results that were available during my care of the patient were reviewed by me and considered in my medical decision making (see chart for details).        48 year old comes in a chief complaint of vaginal bleeding.  Ultrasound does not reveal any evidence of tumors.  She likely is having bleeding because of fibroids which are appreciated on the ultrasound.  Patient denies any recent sexual intercourse and does not have any high risk sexual behavior.  There is no abdominal pain and pelvic exam was deferred.  Patient will be started on 1 week course of Megace and advised to follow-up with Gyne.  Final Clinical Impressions(s) / ED Diagnoses   Final diagnoses:  DUB (dysfunctional uterine bleeding)  Fibroid    ED Discharge Orders    None       Varney Biles, MD 08/11/18 2216

## 2018-08-27 ENCOUNTER — Ambulatory Visit (HOSPITAL_COMMUNITY)
Admission: RE | Admit: 2018-08-27 | Discharge: 2018-08-27 | Disposition: A | Discharge status: Home / Self Care | Payer: No Typology Code available for payment source | Source: Ambulatory Visit | Attending: Family Medicine | Admitting: Family Medicine

## 2018-08-27 ENCOUNTER — Other Ambulatory Visit: Payer: Self-pay

## 2018-08-27 DIAGNOSIS — Z0189 Encounter for other specified special examinations: Secondary | ICD-10-CM | POA: Insufficient documentation

## 2018-09-04 ENCOUNTER — Other Ambulatory Visit: Payer: Self-pay

## 2018-09-04 ENCOUNTER — Ambulatory Visit (HOSPITAL_COMMUNITY)
Admission: EM | Admit: 2018-09-04 | Discharge: 2018-09-04 | Disposition: A | Discharge status: Home / Self Care | Payer: No Typology Code available for payment source | Attending: Family Medicine | Admitting: Family Medicine

## 2018-09-04 ENCOUNTER — Encounter (HOSPITAL_COMMUNITY): Payer: Self-pay

## 2018-09-04 DIAGNOSIS — D219 Benign neoplasm of connective and other soft tissue, unspecified: Secondary | ICD-10-CM | POA: Diagnosis not present

## 2018-09-04 LAB — CBC
HCT: 36.9 % (ref 36.0–46.0)
Hemoglobin: 12.1 g/dL (ref 12.0–15.0)
MCH: 26 pg (ref 26.0–34.0)
MCHC: 32.8 g/dL (ref 30.0–36.0)
MCV: 79.2 fL — ABNORMAL LOW (ref 80.0–100.0)
Platelets: 390 10*3/uL (ref 150–400)
RBC: 4.66 MIL/uL (ref 3.87–5.11)
RDW: 15.9 % — ABNORMAL HIGH (ref 11.5–15.5)
WBC: 10.1 10*3/uL (ref 4.0–10.5)
nRBC: 0 % (ref 0.0–0.2)

## 2018-09-04 MED ORDER — MEGESTROL ACETATE 40 MG PO TABS: 40.0000 mg | ORAL_TABLET | Freq: Every day | ORAL | 0 refills | Status: DC

## 2018-09-04 MED ORDER — HYDROXYZINE HCL 25 MG PO TABS: 25.0000 mg | ORAL_TABLET | Freq: Four times a day (QID) | ORAL | 0 refills | Status: DC

## 2018-09-04 NOTE — ED Triage Notes (Signed)
Patient presents to Urgent Care with complaints of continued vaginal bleeding since being seen in the ED 3 weeks ago for same. Patient reports she had an ultrasound completed and was told that she had fibroids, was given a medication to stop the bleeding, and to come to UC if the bleeding returned. Pt sates the bleeding returned last night.  Pt also endorses loss of appetite for the past 4 days.

## 2018-09-04 NOTE — ED Provider Notes (Signed)
[facility]    CSN: [id number] Arrival date & time: 09/04/18  1341     History   Chief Complaint Chief Complaint  Patient presents with  . Vaginal Bleeding  . Anorexia    HPI Melissa Medina is a 48 y.o. female.   Patient is a 48 year old female presents today with vaginal bleeding.  She has similar episode to this 3 weeks ago and was seen in the ED and diagnosed with fibroids.  They started her on Megace which helped stop the bleeding for approximately 2 weeks and now it has returned.  The bleeding is heavy.  She has had some dark blood and clots.  Some mild cramping in the lower abdomen.  She has had mild loss of appetite, weakness and fatigue.  She is not sleeping.  Reports she has been anxious. She has not followed up with OB/GYN at this point.  No history of anemia.  ROS per HPI      Past Medical History:  Diagnosis Date  . Chronic headaches   . Fibromyalgia   . Herniated disc, cervical     There are no active problems to display for this patient.   Past Surgical History:  Procedure Laterality Date  . APPENDECTOMY    . CHOLECYSTECTOMY      OB History   No obstetric history on file.      Home Medications    Prior to Admission medications   Medication Sig Start Date End Date Taking? Authorizing Provider  hydrOXYzine (ATARAX/VISTARIL) 25 MG tablet Take 1 tablet (25 mg total) by mouth every 6 (six) hours. 09/04/18   Loura Halt A, NP  ibuprofen (ADVIL,MOTRIN) 200 MG tablet Take 1-2 tablets (200-400 mg total) by mouth every 6 (six) hours as needed for moderate pain. 11/13/16   Vanessa Kick, MD  megestrol (MEGACE) 40 MG tablet Take 1 tablet (40 mg total) by mouth daily. 09/04/18   Orvan July, NP    Family History Family History  Problem Relation Age of Onset  . Hypertension Mother   . Diabetes Mother   . Kidney failure Mother   . Healthy Father     Social History Social History   Tobacco Use  . Smoking status: Never Smoker  .  Smokeless tobacco: Never Used  Substance Use Topics  . Alcohol use: No  . Drug use: No     Allergies   Patient has no known allergies.   Review of Systems Review of Systems   Physical Exam Triage Vital Signs ED Triage Vitals  Enc Vitals Group     BP 09/04/18 1412 (!) 129/92     Pulse Rate 09/04/18 1412 (!) 113     Resp 09/04/18 1412 18     Temp 09/04/18 1412 98.9 F (37.2 C)     Temp Source 09/04/18 1412 Oral     SpO2 09/04/18 1412 100 %     Weight --      Height --      Head Circumference --      Peak Flow --      Pain Score 09/04/18 1411 0     Pain Loc --      Pain Edu? --      Excl. in [place]? --    No data found.  Updated Vital Signs BP (!) 129/92 (BP Location: Left Arm)   Pulse (!) 113   Temp 98.9 F (37.2 C) (Oral)   Resp 18   SpO2 100%   Visual  Acuity Right Eye Distance:   Left Eye Distance:   Bilateral Distance:    Right Eye Near:   Left Eye Near:    Bilateral Near:     Physical Exam Vitals signs and nursing note reviewed.  Constitutional:      General: She is not in acute distress.    Appearance: Normal appearance. She is not ill-appearing, toxic-appearing or diaphoretic.  HENT:     Head: Normocephalic and atraumatic.     Nose: Nose normal.     Mouth/Throat:     Pharynx: Oropharynx is clear.  Eyes:     Conjunctiva/sclera: Conjunctivae normal.  Neck:     Musculoskeletal: Normal range of motion.  Cardiovascular:     Rate and Rhythm: Tachycardia present.  Pulmonary:     Effort: Pulmonary effort is normal.     Breath sounds: Normal breath sounds.  Musculoskeletal: Normal range of motion.  Skin:    General: Skin is warm and dry.  Neurological:     Mental Status: She is alert.  Psychiatric:        Mood and Affect: Mood normal.      UC Treatments / Results  Labs (all labs ordered are listed, but only abnormal results are displayed) Labs Reviewed  CBC    EKG None  Radiology No results found.  Procedures Procedures  (including critical care time)  Medications Ordered in UC Medications - No data to display  Initial Impression / Assessment and Plan / UC Course  I have reviewed the triage vital signs and the nursing notes.  Pertinent labs & imaging results that were available during my care of the patient were reviewed by me and considered in my medical decision making (see chart for details).     Patient with known fibroids and bleeding that has returned and is heavy. Checking CBC for anemia based on symptoms and tachycardia Restarting the Megace Hydroxyzine to use as needed for anxiety and to help her rest at night. Contacts put on her discharge instructions for OB/GYN follow-up If symptoms continue or worsen she will need to go back to the hospital. Final Clinical Impressions(s) / UC Diagnoses   Final diagnoses:  Fibroids     Discharge Instructions     We will restart the Megace for bleeding Checking your blood to make sure you are not anemic. I am giving you contact for OB/GYN follow-up.  Make sure you call them to schedule today Hydroxyzine as needed to help with anxiety and rest Follow up as needed for continued or worsening symptoms     ED Prescriptions    Medication Sig Dispense Auth. Provider   megestrol (MEGACE) 40 MG tablet Take 1 tablet (40 mg total) by mouth daily. 7 tablet Whitnie Deleon A, NP   hydrOXYzine (ATARAX/VISTARIL) 25 MG tablet Take 1 tablet (25 mg total) by mouth every 6 (six) hours. 12 tablet Loura Halt A, NP     Controlled Substance Prescriptions [place] Controlled Substance Registry consulted? Not Applicable   Orvan July, NP 09/04/18 1517

## 2018-09-04 NOTE — Discharge Instructions (Addendum)
We will restart the Megace for bleeding Checking your blood to make sure you are not anemic. I am giving you contact for OB/GYN follow-up.  Make sure you call them to schedule today Hydroxyzine as needed to help with anxiety and rest Follow up as needed for continued or worsening symptoms

## 2018-09-07 ENCOUNTER — Telehealth (HOSPITAL_COMMUNITY): Payer: Self-pay | Admitting: Emergency Medicine

## 2018-09-07 NOTE — Telephone Encounter (Signed)
Normal labs per traci, Attempted to reach patient. No answer at this time.

## 2018-09-30 ENCOUNTER — Ambulatory Visit (HOSPITAL_COMMUNITY)
Payer: No Typology Code available for payment source | Attending: Obstetrics and Gynecology | Admitting: Obstetrics and Gynecology

## 2018-09-30 ENCOUNTER — Other Ambulatory Visit: Payer: Self-pay

## 2018-09-30 DIAGNOSIS — Z3169 Encounter for other general counseling and advice on procreation: Secondary | ICD-10-CM

## 2018-09-30 NOTE — Progress Notes (Signed)
Maternal-Fetal Medicine [facility] Consultation)  Referring Provider:  Dr. Neill Loft, MD Reproductive Endocrinologist [facility]  [address] [place], [place], [address]  Patient's Name: Melissa Medina DOB: [birth year] MRN: [id number]  I had the pleasure of seeing Melissa Medina today. She had a webex consultation. I verified her date of birth and showed my ID.  Melissa Medina is 52-years' old G2 P2 and is planning to conceive by fertility treatment. She  had preconception consultation today.  Obstetric history: -1991: Term vaginal delivery in [place] of a female infant weighing 7.5 lbs at birth.  -1993: Term vaginal delivery in [place] of a female infant weighing around 7.5 lbs at birth. Patient reports both her pregnancies were uncomplicated and that her daughters are in good health.  Gyn history: History of abnormal Pap smear in 2001 and she underwent cervical surgery (under anesthesia), possibly cold-knife cone biopsy (?). She does not have breast disease. Patient has initiated fertility treatment with you at [place], [place].   PMH: No history of hypertension or diabetes or any other chronic medical conditions. She does not have HIV or hepatitis B infection or any other infectious disease. PSH: Appendectomy, tonsillectomy, laparoscopic cholecystectomy, breast reduction. Medications: Prenatal vitamins. Allergies: NKDA. Social: Denies tobacco or drug or alcohol use. She has been married 15 years and her husband is in good health. He is not the father of her previous children. He does not have children from previous relationship, if any. Patient works in [employer]. Family: Mother died of complications of diabetes and hypertension that included renal failure. Father was healthy before his natural death. No history of venous thromboembolism in the family.  We discussed the following at her preconception consultation: Fertility treatment: -Patient reports she would like to  conceive with her own eggs. Donor-egg is a possibility only if fertility treatment fails with her own eggs.  -I counseled her that advanced maternal age is associated with increased risks of fetal aneuploidies. Early miscarriages are also more common and it may impact successful fertility treatment. I discussed available screening methods for fetal aneuploidies in pregnancy including cell-free fetal DNA screening. Only invasive testing-chorionic villus sampling or amniocentesis will give a definitive result on the fetal karyotype.  -Preimplantation genetic diagnosis may be possible, which can be discussed with her fertility specialist. However, it has limitations and does not detect all chromosomal anomalies.  -Patient had questions about transfer of more than one embryo. I counseled her that multiple pregnancies including monochorionic twins are more frequent with fertility treatment. High-order multiple gestations (triplets and above) can occur. Patient may be faced with a choice of multifetal pregnancy reduction to improve outcomes.  -Patient is aware of increased successful outcome with donor-egg IVF.  -Her husband is yet to undergo semen analysis.   Risk factors: -Patient does not have any medical conditions that can lead to complications in pregnancy. She does not have hypertension or diabetes. She is a healthy woman and one would expect good pregnancy outcome if successful. -Risk of stillbirth is slightly higher in the third trimester because of advanced maternal age. We recommend weekly antenatal testing from 36 weeks till delivery.  History of cervical surgery: -I am unable to ascertain the nature of cervical surgery performed in 2001 following abnormal Pap smear. Cold-knife cone biopsy procedure is associated with increased risk of cervical incompetence and mid-trimester losses and preterm deliveries. Screening cervical length measurement should be performed at 18 to 20 weeks' gestation to  identify cervical incompetence. Prophylactic cerclage is not  indicated.  Preconception folic acid: -I discussed the benefit of preconception folic acid in reducing the likelihood of open-neural tube defects in the fetus. Ideally, it should be taken at least one month before planned conception. Prenatal vitamins has sufficient daily requirement of folic acid and I encouraged her to continue prenatal vitamins.  Recommendations: -Continue prenatal vitamins or folic acid (790 micrograms daily). -Discuss with fertility specialist on the options of donor-egg. -Patient was counseled on the possibility of high-order multiple gestations whose likelihood may be decreased with choice of appropriate fertility treatment. -Screening of patient/donor/husband before fertility treatment as per protocol. -MFM consultation in early pregnancy.  Thank you for consultation. Face-to-face counseling 30 minutes.  [place], [facility]

## 2019-03-23 DIAGNOSIS — F4329 Adjustment disorder with other symptoms: Secondary | ICD-10-CM | POA: Insufficient documentation

## 2019-03-23 DIAGNOSIS — M25569 Pain in unspecified knee: Secondary | ICD-10-CM | POA: Insufficient documentation

## 2019-03-23 DIAGNOSIS — S0500XA Injury of conjunctiva and corneal abrasion without foreign body, unspecified eye, initial encounter: Secondary | ICD-10-CM | POA: Insufficient documentation

## 2019-03-23 DIAGNOSIS — D500 Iron deficiency anemia secondary to blood loss (chronic): Secondary | ICD-10-CM

## 2019-03-23 DIAGNOSIS — E559 Vitamin D deficiency, unspecified: Secondary | ICD-10-CM | POA: Insufficient documentation

## 2019-03-23 DIAGNOSIS — N6011 Diffuse cystic mastopathy of right breast: Secondary | ICD-10-CM | POA: Insufficient documentation

## 2019-03-23 DIAGNOSIS — N644 Mastodynia: Secondary | ICD-10-CM | POA: Insufficient documentation

## 2019-03-23 DIAGNOSIS — Z87410 Personal history of cervical dysplasia: Secondary | ICD-10-CM | POA: Insufficient documentation

## 2019-03-23 DIAGNOSIS — G4710 Hypersomnia, unspecified: Secondary | ICD-10-CM | POA: Insufficient documentation

## 2019-03-23 DIAGNOSIS — S069X0A Unspecified intracranial injury without loss of consciousness, initial encounter: Secondary | ICD-10-CM | POA: Insufficient documentation

## 2019-03-23 DIAGNOSIS — Z9889 Other specified postprocedural states: Secondary | ICD-10-CM

## 2019-03-23 DIAGNOSIS — F32A Depression, unspecified: Secondary | ICD-10-CM | POA: Insufficient documentation

## 2019-03-23 DIAGNOSIS — N939 Abnormal uterine and vaginal bleeding, unspecified: Secondary | ICD-10-CM

## 2019-03-23 DIAGNOSIS — G8929 Other chronic pain: Secondary | ICD-10-CM | POA: Insufficient documentation

## 2019-03-23 DIAGNOSIS — R059 Cough, unspecified: Secondary | ICD-10-CM | POA: Insufficient documentation

## 2019-03-23 DIAGNOSIS — N921 Excessive and frequent menstruation with irregular cycle: Secondary | ICD-10-CM

## 2019-03-23 DIAGNOSIS — K219 Gastro-esophageal reflux disease without esophagitis: Secondary | ICD-10-CM | POA: Insufficient documentation

## 2019-03-23 DIAGNOSIS — R079 Chest pain, unspecified: Secondary | ICD-10-CM | POA: Insufficient documentation

## 2019-03-23 DIAGNOSIS — M545 Low back pain, unspecified: Secondary | ICD-10-CM | POA: Insufficient documentation

## 2019-03-23 DIAGNOSIS — M25579 Pain in unspecified ankle and joints of unspecified foot: Secondary | ICD-10-CM | POA: Insufficient documentation

## 2019-03-23 DIAGNOSIS — G4730 Sleep apnea, unspecified: Secondary | ICD-10-CM | POA: Insufficient documentation

## 2019-03-23 DIAGNOSIS — H527 Unspecified disorder of refraction: Secondary | ICD-10-CM | POA: Insufficient documentation

## 2019-03-23 DIAGNOSIS — G43909 Migraine, unspecified, not intractable, without status migrainosus: Secondary | ICD-10-CM | POA: Insufficient documentation

## 2019-03-23 DIAGNOSIS — N12 Tubulo-interstitial nephritis, not specified as acute or chronic: Secondary | ICD-10-CM | POA: Insufficient documentation

## 2019-03-23 DIAGNOSIS — G2581 Restless legs syndrome: Secondary | ICD-10-CM | POA: Insufficient documentation

## 2019-03-23 DIAGNOSIS — M797 Fibromyalgia: Secondary | ICD-10-CM | POA: Insufficient documentation

## 2019-03-23 DIAGNOSIS — N62 Hypertrophy of breast: Secondary | ICD-10-CM | POA: Insufficient documentation

## 2019-03-23 DIAGNOSIS — N6019 Diffuse cystic mastopathy of unspecified breast: Secondary | ICD-10-CM | POA: Insufficient documentation

## 2019-03-23 DIAGNOSIS — G43009 Migraine without aura, not intractable, without status migrainosus: Secondary | ICD-10-CM | POA: Insufficient documentation

## 2019-03-23 DIAGNOSIS — F4310 Post-traumatic stress disorder, unspecified: Secondary | ICD-10-CM | POA: Insufficient documentation

## 2019-03-23 DIAGNOSIS — R0600 Dyspnea, unspecified: Secondary | ICD-10-CM | POA: Insufficient documentation

## 2019-03-23 HISTORY — DX: Iron deficiency anemia secondary to blood loss (chronic): D50.0

## 2019-03-23 HISTORY — DX: Abnormal uterine and vaginal bleeding, unspecified: N93.9

## 2019-03-23 HISTORY — DX: Other specified postprocedural states: Z98.890

## 2019-03-23 HISTORY — DX: Tubulo-interstitial nephritis, not specified as acute or chronic: N12

## 2019-03-23 HISTORY — DX: Excessive and frequent menstruation with irregular cycle: N92.1

## 2019-03-26 NOTE — L&D Delivery Note (Incomplete Revision)
Operative Delivery Note  Melissa Medina is a 49 y.o. G8T1572 s/p forceps-assisted at [redacted]w[redacted]d. She was admitted for IOL secondary to gHTN, now with preeclampsia with severe features.   At 1:50 PM a viable female was delivered via Vaginal, Forceps.  Presentation: vertex; Position: Right,, Occiput,, Posterior; Station: +2.   Verbal consent: obtained from patient.  Risks and benefits discussed in detail.  Risks include, but are not limited to the risks of anesthesia, bleeding, infection, damage to maternal tissues, fetal cephalhematoma.  There is also the risk of inability to effect vaginal delivery of the head, or shoulder dystocia that cannot be resolved by established maneuvers, leading to the need for emergency cesarean section.  APGAR: 6, 9; 2892gm Placenta status: intact, sent to L&D   Cord: 3-vessel with 2 true knots, following complications: none  Venous cord gas pH: 7.30, Arterial cord gas pH: sent but unable to run  Anesthesia: epidural Instruments: Luikart Simpson Forceps Episiotomy: None Lacerations: 3A perineal laceration Suture Repair: 1-0 & 2-0 vicryl, repaired in standard fashion Est. Blood Loss (mL):  150 ml  Mom to postpartum.  Baby to Couplet care / Skin to Skin.  Randa Ngo 02/21/2020, 2:45 PM

## 2019-03-26 NOTE — L&D Delivery Note (Addendum)
Operative Delivery Note  Melissa Medina is a 49 y.o. [id number] s/p forceps-assisted at [redacted]w[redacted]d. She was admitted for IOL secondary to gHTN, now with preeclampsia with severe features.   Patient having recurrent and deep decelerations with pushing. SVE with fetus LOA, BPD to +2, EFW 3000gm, pelvis felt adequate. I d/w her re: risks with operative vaginal delivery via forceps and she was amenable to trial. Foley, FSE removed. Peds present. Luikart-Simpsons placed without difficulty; anesthesia adequate. With next contraction, fetus delivered via forceps.   At 1:50 PM a viable female was delivered via Vaginal, Forceps.  Presentation: vertex; Position: Right,, Occiput,, Posterior; Station: +2.   Verbal consent: obtained from patient.  Risks and benefits discussed in detail.  Risks include, but are not limited to the risks of anesthesia, bleeding, infection, damage to maternal tissues, fetal cephalhematoma.  There is also the risk of inability to effect vaginal delivery of the head, or shoulder dystocia that cannot be resolved by established maneuvers, leading to the need for emergency cesarean section.  APGAR: 6, 9; 2892gm Placenta status: intact, sent to L&D   Cord: 3-vessel with 2 true knots, following complications: none  Venous cord gas pH: 7.30, Arterial cord gas pH: sent but unable to run  Anesthesia: epidural Episiotomy: None Lacerations: 3A perineal laceration Suture Repair: 1-0 & 2-0 vicryl, repaired in standard fashion. Rectal exam pre and post repair negative Est. Blood Loss (mL):  150 ml  Mom to postpartum.  Baby to Couplet care / Skin to Skin.  Randa Ngo 02/21/2020, 2:45 PM  Agree with above. I was present for the entire procedure.   Durene Romans MD Attending Center for [employer] farm [employer])

## 2019-07-14 LAB — OB RESULTS CONSOLE HGB/HCT, BLOOD
HCT: 35 (ref 29–41)
Hemoglobin: 11.8

## 2019-07-14 LAB — OB RESULTS CONSOLE PLATELET COUNT: Platelets: 301

## 2019-07-18 ENCOUNTER — Encounter (HOSPITAL_COMMUNITY): Payer: Self-pay | Admitting: Emergency Medicine

## 2019-07-18 ENCOUNTER — Inpatient Hospital Stay (HOSPITAL_COMMUNITY): Payer: No Typology Code available for payment source

## 2019-07-18 ENCOUNTER — Other Ambulatory Visit: Payer: Self-pay

## 2019-07-18 ENCOUNTER — Inpatient Hospital Stay (HOSPITAL_COMMUNITY)
Admission: EM | Admit: 2019-07-18 | Discharge: 2019-07-18 | Disposition: A | Discharge status: Home / Self Care | Payer: No Typology Code available for payment source | Attending: Obstetrics and Gynecology | Admitting: Obstetrics and Gynecology

## 2019-07-18 DIAGNOSIS — Z3A01 Less than 8 weeks gestation of pregnancy: Secondary | ICD-10-CM | POA: Insufficient documentation

## 2019-07-18 DIAGNOSIS — D259 Leiomyoma of uterus, unspecified: Secondary | ICD-10-CM

## 2019-07-18 DIAGNOSIS — Z3491 Encounter for supervision of normal pregnancy, unspecified, first trimester: Secondary | ICD-10-CM

## 2019-07-18 DIAGNOSIS — O418X10 Other specified disorders of amniotic fluid and membranes, first trimester, not applicable or unspecified: Secondary | ICD-10-CM

## 2019-07-18 DIAGNOSIS — O3411 Maternal care for benign tumor of corpus uteri, first trimester: Secondary | ICD-10-CM

## 2019-07-18 DIAGNOSIS — D251 Intramural leiomyoma of uterus: Secondary | ICD-10-CM | POA: Diagnosis not present

## 2019-07-18 DIAGNOSIS — O208 Other hemorrhage in early pregnancy: Secondary | ICD-10-CM | POA: Insufficient documentation

## 2019-07-18 DIAGNOSIS — O468X1 Other antepartum hemorrhage, first trimester: Secondary | ICD-10-CM | POA: Diagnosis not present

## 2019-07-18 DIAGNOSIS — O4691 Antepartum hemorrhage, unspecified, first trimester: Secondary | ICD-10-CM | POA: Diagnosis not present

## 2019-07-18 DIAGNOSIS — O209 Hemorrhage in early pregnancy, unspecified: Secondary | ICD-10-CM | POA: Diagnosis present

## 2019-07-18 HISTORY — DX: Migraine, unspecified, not intractable, without status migrainosus: G43.909

## 2019-07-18 LAB — URINALYSIS, ROUTINE W REFLEX MICROSCOPIC
Bilirubin Urine: NEGATIVE
Glucose, UA: NEGATIVE mg/dL
Ketones, ur: NEGATIVE mg/dL
Leukocytes,Ua: NEGATIVE
Nitrite: NEGATIVE
Protein, ur: NEGATIVE mg/dL
RBC / HPF: >50 RBC/hpf — ABNORMAL HIGH (ref 0–5)
Specific Gravity, Urine: 1.005 (ref 1.005–1.030)
pH: 7 (ref 5.0–8.0)

## 2019-07-18 LAB — CBC
HCT: 37.9 % (ref 36.0–46.0)
Hemoglobin: 12.6 g/dL (ref 12.0–15.0)
MCH: 28.3 pg (ref 26.0–34.0)
MCHC: 33.2 g/dL (ref 30.0–36.0)
MCV: 85 fL (ref 80.0–100.0)
Platelets: 294 10*3/uL (ref 150–400)
RBC: 4.46 MIL/uL (ref 3.87–5.11)
RDW: 15.7 % — ABNORMAL HIGH (ref 11.5–15.5)
WBC: 8.9 10*3/uL (ref 4.0–10.5)
nRBC: 0 % (ref 0.0–0.2)

## 2019-07-18 LAB — HCG, QUANTITATIVE, PREGNANCY: hCG, Beta Chain, Quant, S: 22287 m[IU]/mL — ABNORMAL HIGH (ref <5)

## 2019-07-18 NOTE — Discharge Instructions (Signed)
Subchorionic Hematoma  A subchorionic hematoma is a gathering of blood between the outer wall of the embryo (chorion) and the inner wall of the womb (uterus). This condition can cause vaginal bleeding. If they cause little or no vaginal bleeding, early small hematomas usually shrink on their own and do not affect your baby or pregnancy. When bleeding starts later in pregnancy, or if the hematoma is larger or occurs in older pregnant women, the condition may be more serious. Larger hematomas may get bigger, which increases the chances of miscarriage. This condition also increases the risk of:  Premature separation of the placenta from the uterus.  Premature (preterm) labor.  Stillbirth. What are the causes? The exact cause of this condition is not known. It occurs when blood is trapped between the placenta and the uterine wall because the placenta has separated from the original site of implantation. What increases the risk? You are more likely to develop this condition if:  You were treated with fertility medicines.  You conceived through in vitro fertilization (IVF). What are the signs or symptoms? Symptoms of this condition include:  Vaginal spotting or bleeding.  Contractions of the uterus. These cause abdominal pain. Sometimes you may have no symptoms and the bleeding may only be seen when ultrasound images are taken (transvaginal ultrasound). How is this diagnosed? This condition is diagnosed based on a physical exam. This includes a pelvic exam. You may also have other tests, including:  Blood tests.  Urine tests.  Ultrasound of the abdomen. How is this treated? Treatment for this condition can vary. Treatment may include:  Watchful waiting. You will be monitored closely for any changes in bleeding. During this stage: ? The hematoma may be reabsorbed by the body. ? The hematoma may separate the fluid-filled space containing the embryo (gestational sac) from the wall of the  womb (endometrium).  Medicines.  Activity restriction. This may be needed until the bleeding stops. Follow these instructions at home:  Stay on bed rest if told to do so by your health care provider.  Do not lift anything that is heavier than 10 lbs. (4.5 kg) or as told by your health care provider.  Do not use any products that contain nicotine or tobacco, such as cigarettes and e-cigarettes. If you need help quitting, ask your health care provider.  Track and write down the number of pads you use each day and how soaked (saturated) they are.  Do not use tampons.  Keep all follow-up visits as told by your health care provider. This is important. Your health care provider may ask you to have follow-up blood tests or ultrasound tests or both. Contact a health care provider if:  You have any vaginal bleeding.  You have a fever. Get help right away if:  You have severe cramps in your stomach, back, abdomen, or pelvis.  You pass large clots or tissue. Save any tissue for your health care provider to look at.  You have more vaginal bleeding, and you faint or become lightheaded or weak. Summary  A subchorionic hematoma is a gathering of blood between the outer wall of the placenta and the uterus.  This condition can cause vaginal bleeding.  Sometimes you may have no symptoms and the bleeding may only be seen when ultrasound images are taken.  Treatment may include watchful waiting, medicines, or activity restriction. This information is not intended to replace advice given to you by your health care provider. Make sure you discuss any questions you  have with your health care provider. Document Revised: 02/21/2017 Document Reviewed: 05/07/2016 Elsevier Patient Education  2020 [employer]                        Safe Medications in Pregnancy    Acne: Benzoyl Peroxide Salicylic Acid  Backache/Headache: Tylenol: 2 regular strength every 4 hours OR              2 Extra  strength every 6 hours  Colds/Coughs/Allergies: Benadryl (alcohol free) 25 mg every 6 hours as needed Breath right strips Claritin Cepacol throat lozenges Chloraseptic throat spray Cold-Eeze- up to three times per day Cough drops, alcohol free Flonase (by prescription only) Guaifenesin Mucinex Robitussin DM (plain only, alcohol free) Saline nasal spray/drops Sudafed (pseudoephedrine) & Actifed ** use only after [redacted] weeks gestation and if you do not have high blood pressure Tylenol Vicks Vaporub Zinc lozenges Zyrtec   Constipation: Colace Ducolax suppositories Fleet enema Glycerin suppositories Metamucil Milk of magnesia Miralax Senokot Smooth move tea  Diarrhea: Kaopectate Imodium A-D  *NO pepto Bismol  Hemorrhoids: Anusol Anusol HC Preparation H Tucks  Indigestion: Tums Maalox Mylanta Zantac  Pepcid  Insomnia: Benadryl (alcohol free) 25mg every 6 hours as needed Tylenol PM Unisom, no Gelcaps  Leg Cramps: Tums MagGel  Nausea/Vomiting:  Bonine Dramamine Emetrol Ginger extract Sea bands Meclizine  Nausea medication to take during pregnancy:  Unisom (doxylamine succinate 25 mg tablets) Take one tablet daily at bedtime. If symptoms are not adequately controlled, the dose can be increased to a maximum recommended dose of two tablets daily (1/2 tablet in the morning, 1/2 tablet mid-afternoon and one at bedtime). Vitamin B6 100mg tablets. Take one tablet twice a day (up to 200 mg per day).  Skin Rashes: Aveeno products Benadryl cream or 25mg every 6 hours as needed Calamine Lotion 1% cortisone cream  Yeast infection: Gyne-lotrimin 7 Monistat 7   **If taking multiple medications, please check labels to avoid duplicating the same active ingredients **take medication as directed on the label ** Do not exceed 4000 mg of tylenol in 24 hours **Do not take medications that contain aspirin or ibuprofen           

## 2019-07-18 NOTE — ED Triage Notes (Signed)
Pt states she is approx [redacted] weeks pregnant with spotting since yesterday.  States bleeding a little more today than yesterday.  Reports 2 confirmed preg test at [facility].  Denies pain.

## 2019-07-18 NOTE — MAU Provider Note (Signed)
History     CSN: [id number]  Arrival date and time: 07/18/19 1333   First Provider Initiated Contact with Patient 07/18/19 1434      Chief Complaint  Patient presents with  . Vaginal Bleeding    Approx [redacted] weeks pregnant   HPI Melissa Medina is a 49 y.o. [id number] at [redacted]w[redacted]d who presents with vaginal bleeding. She reports an IVF transfer on 4/1 and a positive pregnancy test 2 weeks later. She reports having normal HCG levels two times but unsure what they were. She states she was supposed to have an ultrasound on Tuesday but started bleeding. She first noticed the bleeding on Friday but it stopped and returned today. She states it is light when she wipes. She reports intermittent abdominal cramping that she rates a 3/10  OB History    Gravida  3   Para  2   Term  2   Preterm      AB      Living  2     SAB      TAB      Ectopic      Multiple      Live Births  2           Past Medical History:  Diagnosis Date  . Chronic headaches   . Fibromyalgia   . Herniated disc, cervical   . Migraine     Past Surgical History:  Procedure Laterality Date  . APPENDECTOMY     childhood  . CHOLECYSTECTOMY    . TONSILLECTOMY     childhood    Family History  Problem Relation Age of Onset  . Hypertension Mother   . Diabetes Mother   . Kidney failure Mother   . Healthy Father     Social History   Tobacco Use  . Smoking status: Never Smoker  . Smokeless tobacco: Never Used  Substance Use Topics  . Alcohol use: No  . Drug use: No    Allergies: No Known Allergies  Medications Prior to Admission  Medication Sig Dispense Refill Last Dose  . hydrOXYzine (ATARAX/VISTARIL) 25 MG tablet Take 1 tablet (25 mg total) by mouth every 6 (six) hours. 12 tablet 0   . ibuprofen (ADVIL,MOTRIN) 200 MG tablet Take 1-2 tablets (200-400 mg total) by mouth every 6 (six) hours as needed for moderate pain. 30 tablet 0   . megestrol (MEGACE) 40 MG tablet Take 1 tablet (40 mg total) by  mouth daily. 7 tablet 0     Review of Systems  Constitutional: Negative.  Negative for fatigue and fever.  HENT: Negative.   Respiratory: Negative.  Negative for shortness of breath.   Cardiovascular: Negative.  Negative for chest pain.  Gastrointestinal: Positive for abdominal pain. Negative for constipation, diarrhea, nausea and vomiting.  Genitourinary: Positive for vaginal bleeding. Negative for dysuria.  Neurological: Negative.  Negative for dizziness and headaches.   Physical Exam   Blood pressure 129/88, pulse 96, temperature 98.5 F (36.9 C), temperature source Oral, resp. rate 16, height 5\' 5"  (1.651 m), weight 98.8 kg, last menstrual period 06/05/2019, SpO2 97 %.  Physical Exam  Nursing note and vitals reviewed. Constitutional: She is oriented to person, place, and time. She appears well-developed and well-nourished. No distress.  HENT:  Head: Normocephalic.  Eyes: Pupils are equal, round, and reactive to light.  Cardiovascular: Normal rate, regular rhythm and normal heart sounds.  Respiratory: Effort normal and breath sounds normal. No respiratory distress.  GI: Soft. Bowel sounds are  normal. She exhibits no distension. There is no abdominal tenderness.  Genitourinary:    Genitourinary Comments: Scant vaginal bleeding noted   Neurological: She is alert and oriented to person, place, and time.  Skin: Skin is warm and dry.  Psychiatric: She has a normal mood and affect. Her behavior is normal. Judgment and thought content normal.    MAU Course  Procedures Results for orders placed or performed during the hospital encounter of 07/18/19 (from the past 24 hour(s))  Urinalysis, Routine w reflex microscopic     Status: Abnormal   Collection Time: 07/18/19  2:07 PM  Result Value Ref Range   Color, Urine YELLOW YELLOW   APPearance CLEAR CLEAR   Specific Gravity, Urine 1.005 1.005 - 1.030   pH 7.0 5.0 - 8.0   Glucose, UA NEGATIVE NEGATIVE mg/dL   Hgb urine dipstick LARGE  (A) NEGATIVE   Bilirubin Urine NEGATIVE NEGATIVE   Ketones, ur NEGATIVE NEGATIVE mg/dL   Protein, ur NEGATIVE NEGATIVE mg/dL   Nitrite NEGATIVE NEGATIVE   Leukocytes,Ua NEGATIVE NEGATIVE   RBC / HPF >50 (H) 0 - 5 RBC/hpf   WBC, UA 0-5 0 - 5 WBC/hpf   Bacteria, UA RARE (A) NONE SEEN   Squamous Epithelial / LPF 0-5 0 - 5  CBC     Status: Abnormal   Collection Time: 07/18/19  2:28 PM  Result Value Ref Range   WBC 8.9 4.0 - 10.5 K/uL   RBC 4.46 3.87 - 5.11 MIL/uL   Hemoglobin 12.6 12.0 - 15.0 g/dL   HCT 37.9 36.0 - 46.0 %   MCV 85.0 80.0 - 100.0 fL   MCH 28.3 26.0 - 34.0 pg   MCHC 33.2 30.0 - 36.0 g/dL   RDW 15.7 (H) 11.5 - 15.5 %   Platelets 294 150 - 400 K/uL   nRBC 0.0 0.0 - 0.2 %  hCG, quantitative, pregnancy     Status: Abnormal   Collection Time: 07/18/19  2:28 PM  Result Value Ref Range   hCG, Beta Chain, Quant, S 22,287 (H) <5 mIU/mL   US OB LESS THAN 14 WEEKS WITH OB TRANSVAGINAL  Result Date: 07/18/2019 CLINICAL DATA:  49 year old female status post IVF with vaginal bleeding in the 1st trimester of pregnancy. Quantitative beta HCG twenty-two thousand two hundred eighty-seven. Estimated gestational age by LMP 6 weeks 1 day. EXAM: OBSTETRIC <14 WK [place] AND TRANSVAGINAL OB US TECHNIQUE: Both transabdominal and transvaginal ultrasound examinations were performed for complete evaluation of the gestation as well as the maternal uterus, adnexal regions, and pelvic cul-de-sac. Transvaginal technique was performed to assess early pregnancy. COMPARISON:  None relevant. FINDINGS: Intrauterine gestational sac: Single Yolk sac:  Visible Embryo:  Visible Cardiac Activity: Detected Heart Rate: 122 bpm MSD: 9.1 mm   5 w   5 d CRL:  4.6 mm   6 w   1 d                  [place] EDC: 03/11/2020 Subchorionic hemorrhage: A small volume of subchorionic hemorrhage is suspected (images 47 and 48). Maternal uterus/adnexae: Superimposed 2 relatively large fibroids. A left side upper fundal fibroid measures up to  the 7.1 cm diameter (image 39). A smaller intramural posterior body fibroid measures up to 3.7 cm (image 56). No pelvic free fluid identified. The left ovary appears within normal limits measuring 4.5 x 2.7 x 2.6 cm. The right ovary appears normal measuring 3.0 x 1.8 x 1.9 cm. IMPRESSION: 1. Single living IUP demonstrated.  Small volume subchorionic hemorrhage suspected. 2. No pelvic free fluid. Two relatively large uterine fibroids are identified measuring 7.1 cm (fundus) and 3.7 cm (posterior body). 3. Both ovaries appear within normal limits. Electronically Signed   By: Genevie Ann M.D.   On: 07/18/2019 15:47    MDM UA, UPT CBC, HCG ABO/Rh- O Pos Wet prep and gc/chlamydia US OB Comp Less 14 weeks with Transvaginal   Assessment and Plan   1. Normal intrauterine pregnancy on prenatal ultrasound in first trimester   2. Vaginal bleeding affecting early pregnancy   3. Subchorionic hemorrhage of placenta in first trimester, single or unspecified fetus   4. Uterine leiomyoma, unspecified location    -Discharge home in stable condition -First trimester precautions discussed -Patient advised to follow-up with [facility] for prenatal care -Patient may return to MAU as needed or if her condition were to change or worsen   Wende Mott 07/18/2019, 2:34 PM

## 2019-07-18 NOTE — MAU Note (Signed)
Daytona Stanforth is a 48 y.o. at [redacted]w[redacted]d here in MAU reporting: states IVF pregnancy, transfer was 04/01. Is supposed to have an u/s this Tuesday. Started spotting on Friday and yesterday saw some more bleeding. Today she is still seeing the bleeding, saw some bleeding into the toilet, is now wearing a pad, small amount on pad. Having light cramping.  LMP: 06/05/19  Onset of complaint: Friday  Pain score: 1/10  Vitals:   07/18/19 1119 07/18/19 1406  BP: (!) 146/98 129/88  Pulse: (!) 106 96  Resp: 16 16  Temp: 98.4 F (36.9 C) 98.5 F (36.9 C)  SpO2: 99% 97%     Lab orders placed from triage: [facility] RN from [facility] and she reports UPT in the ED was positive but POCT not transferring over to chart.

## 2019-07-19 LAB — POCT PREGNANCY, URINE: Preg Test, Ur: POSITIVE — AB

## 2019-07-27 ENCOUNTER — Other Ambulatory Visit: Payer: Self-pay

## 2019-07-27 ENCOUNTER — Inpatient Hospital Stay (HOSPITAL_COMMUNITY)
Admission: AD | Admit: 2019-07-27 | Discharge: 2019-07-27 | Disposition: A | Discharge status: Home / Self Care | Payer: No Typology Code available for payment source | Attending: Obstetrics and Gynecology | Admitting: Obstetrics and Gynecology

## 2019-07-27 ENCOUNTER — Inpatient Hospital Stay (HOSPITAL_COMMUNITY): Payer: No Typology Code available for payment source

## 2019-07-27 ENCOUNTER — Encounter (HOSPITAL_COMMUNITY): Payer: Self-pay | Admitting: Obstetrics and Gynecology

## 2019-07-27 DIAGNOSIS — O208 Other hemorrhage in early pregnancy: Secondary | ICD-10-CM | POA: Insufficient documentation

## 2019-07-27 DIAGNOSIS — Z3A01 Less than 8 weeks gestation of pregnancy: Secondary | ICD-10-CM | POA: Diagnosis not present

## 2019-07-27 DIAGNOSIS — O99351 Diseases of the nervous system complicating pregnancy, first trimester: Secondary | ICD-10-CM | POA: Diagnosis not present

## 2019-07-27 DIAGNOSIS — Z6790 Unspecified blood type, Rh positive: Secondary | ICD-10-CM

## 2019-07-27 DIAGNOSIS — O2341 Unspecified infection of urinary tract in pregnancy, first trimester: Secondary | ICD-10-CM | POA: Diagnosis not present

## 2019-07-27 DIAGNOSIS — G44209 Tension-type headache, unspecified, not intractable: Secondary | ICD-10-CM | POA: Insufficient documentation

## 2019-07-27 DIAGNOSIS — O209 Hemorrhage in early pregnancy, unspecified: Secondary | ICD-10-CM

## 2019-07-27 DIAGNOSIS — O10919 Unspecified pre-existing hypertension complicating pregnancy, unspecified trimester: Secondary | ICD-10-CM

## 2019-07-27 DIAGNOSIS — O10011 Pre-existing essential hypertension complicating pregnancy, first trimester: Secondary | ICD-10-CM | POA: Insufficient documentation

## 2019-07-27 DIAGNOSIS — O10911 Unspecified pre-existing hypertension complicating pregnancy, first trimester: Secondary | ICD-10-CM

## 2019-07-27 DIAGNOSIS — R300 Dysuria: Secondary | ICD-10-CM | POA: Diagnosis present

## 2019-07-27 DIAGNOSIS — O418X11 Other specified disorders of amniotic fluid and membranes, first trimester, fetus 1: Secondary | ICD-10-CM

## 2019-07-27 LAB — CBC
HCT: 37.8 % (ref 36.0–46.0)
Hemoglobin: 12.3 g/dL (ref 12.0–15.0)
MCH: 28.3 pg (ref 26.0–34.0)
MCHC: 32.5 g/dL (ref 30.0–36.0)
MCV: 87.1 fL (ref 80.0–100.0)
Platelets: 317 10*3/uL (ref 150–400)
RBC: 4.34 MIL/uL (ref 3.87–5.11)
RDW: 15.2 % (ref 11.5–15.5)
WBC: 9.6 10*3/uL (ref 4.0–10.5)
nRBC: 0 % (ref 0.0–0.2)

## 2019-07-27 LAB — URINALYSIS, ROUTINE W REFLEX MICROSCOPIC
Bilirubin Urine: NEGATIVE
Glucose, UA: NEGATIVE mg/dL
Ketones, ur: NEGATIVE mg/dL
Nitrite: NEGATIVE
Protein, ur: 30 mg/dL — AB
Specific Gravity, Urine: 1.018 (ref 1.005–1.030)
WBC, UA: >50 WBC/hpf — ABNORMAL HIGH (ref 0–5)
pH: 5 (ref 5.0–8.0)

## 2019-07-27 LAB — COMPREHENSIVE METABOLIC PANEL
ALT: 19 U/L (ref 0–44)
AST: 17 U/L (ref 15–41)
Albumin: 3.2 g/dL — ABNORMAL LOW (ref 3.5–5.0)
Alkaline Phosphatase: 84 U/L (ref 38–126)
Anion gap: 9 (ref 5–15)
BUN: <5 mg/dL — ABNORMAL LOW (ref 6–20)
CO2: 23 mmol/L (ref 22–32)
Calcium: 9 mg/dL (ref 8.9–10.3)
Chloride: 104 mmol/L (ref 98–111)
Creatinine, Ser: 0.67 mg/dL (ref 0.44–1.00)
GFR calc Af Amer: >60 mL/min (ref >60)
GFR calc non Af Amer: >60 mL/min (ref >60)
Glucose, Bld: 90 mg/dL (ref 70–99)
Potassium: 4.3 mmol/L (ref 3.5–5.1)
Sodium: 136 mmol/L (ref 135–145)
Total Bilirubin: 0.5 mg/dL (ref 0.3–1.2)
Total Protein: 6.6 g/dL (ref 6.5–8.1)

## 2019-07-27 MED ORDER — BUTALBITAL-APAP-CAFFEINE 50-325-40 MG PO TABS: 1.0000 | ORAL_TABLET | Freq: Four times a day (QID) | ORAL | 0 refills | Status: DC | PRN

## 2019-07-27 MED ORDER — BUTALBITAL-APAP-CAFFEINE 50-325-40 MG PO TABS
2.0000 | ORAL_TABLET | Freq: Four times a day (QID) | ORAL | Status: DC | PRN
  Administered 2019-07-27: 2 via ORAL
  Filled 2019-07-27: qty 2

## 2019-07-27 MED ORDER — CEPHALEXIN 500 MG PO CAPS: 500.0000 mg | ORAL_CAPSULE | Freq: Three times a day (TID) | ORAL | 0 refills | Status: AC

## 2019-07-27 NOTE — MAU Note (Signed)
Been having headaches since Sat, took 2 Tylenol, no relief. Has been monitoring her BP, was up.  Having pain when she urinates and is still bleeding. dk brown she wipes.no appetite, no energy

## 2019-07-27 NOTE — MAU Provider Note (Signed)
History     CSN: [id number]  Arrival date and time: 07/27/19 1102   First Provider Initiated Contact with Patient 07/27/19 1156      Chief Complaint  Patient presents with  . Dysuria  . Headache  . Hypertension  . Vaginal Bleeding   49 y.o. [id number] @[redacted]w[redacted]d  with IVF pregnancy presenting with dysuria, HA, elevated BP, spotting, and poor appetite. Dysuria started 2 days ago. Also reports polyuria. No hematuria or urgency. HA started 3 days ago, located all areas of head. No associated visual disturbances except light sensitivity. She checked her BP at home and was elevated. Reports no hx of HTN. Reports lite dark spotting when she wipes intermittently over the last week. Has occasional mild cramping. Also reports only able to eat a few bites at a time and poor appetite. She is drinking well. No N/V.   OB History    Gravida  3   Para  2   Term  2   Preterm      AB      Living  2     SAB      TAB      Ectopic      Multiple      Live Births  2           Past Medical History:  Diagnosis Date  . Chronic headaches   . Fibromyalgia   . Herniated disc, cervical   . Migraine     Past Surgical History:  Procedure Laterality Date  . APPENDECTOMY     childhood  . CHOLECYSTECTOMY    . TONSILLECTOMY     childhood    Family History  Problem Relation Age of Onset  . Hypertension Mother   . Diabetes Mother   . Kidney failure Mother   . Healthy Father     Social History   Tobacco Use  . Smoking status: Never Smoker  . Smokeless tobacco: Never Used  Substance Use Topics  . Alcohol use: No  . Drug use: No    Allergies: No Known Allergies  Medications Prior to Admission  Medication Sig Dispense Refill Last Dose  . hydrOXYzine (ATARAX/VISTARIL) 25 MG tablet Take 1 tablet (25 mg total) by mouth every 6 (six) hours. 12 tablet 0     Review of Systems  Constitutional: Positive for appetite change.  Eyes: Positive for photophobia. Negative for visual  disturbance.  Gastrointestinal: Positive for abdominal pain. Negative for nausea and vomiting.  Genitourinary: Positive for dysuria, frequency and vaginal bleeding. Negative for hematuria and urgency.  Neurological: Positive for weakness and headaches.   Physical Exam   Blood pressure 126/83, pulse 76, temperature 98.2 F (36.8 C), temperature source Oral, resp. rate 17, height 5\' 5"  (1.651 m), weight 98.1 kg, last menstrual period 06/05/2019, SpO2 100 %. Patient Vitals for the past 24 hrs:  BP Temp Temp src Pulse Resp SpO2 Height Weight  07/27/19 1346 126/83 -- -- 76 -- -- -- --  07/27/19 1345 132/84 -- -- 76 -- -- -- --  07/27/19 1300 132/81 -- -- 77 -- -- -- --  07/27/19 1245 129/84 -- -- 78 -- -- -- --  07/27/19 1121 136/90 98.2 F (36.8 C) Oral 95 17 100 % 5\' 5"  (1.651 m) 98.1 kg    Physical Exam  Nursing note and vitals reviewed. Constitutional: She is oriented to person, place, and time. She appears well-developed and well-nourished. No distress.  HENT:  Head: Normocephalic and atraumatic.  Cardiovascular: Normal rate.  Respiratory: Effort normal. No respiratory distress.  GI: Soft. She exhibits no distension and no mass. There is no abdominal tenderness. There is no rebound and no guarding.  Musculoskeletal:        General: Normal range of motion.     Cervical back: Normal range of motion.  Neurological: She is alert and oriented to person, place, and time.  Skin: Skin is warm and dry.  Psychiatric: She has a normal mood and affect.   Results for orders placed or performed during the hospital encounter of 07/27/19 (from the past 24 hour(s))  Urinalysis, Routine w reflex microscopic     Status: Abnormal   Collection Time: 07/27/19 11:44 AM  Result Value Ref Range   Color, Urine YELLOW YELLOW   APPearance HAZY (A) CLEAR   Specific Gravity, Urine 1.018 1.005 - 1.030   pH 5.0 5.0 - 8.0   Glucose, UA NEGATIVE NEGATIVE mg/dL   Hgb urine dipstick MODERATE (A) NEGATIVE    Bilirubin Urine NEGATIVE NEGATIVE   Ketones, ur NEGATIVE NEGATIVE mg/dL   Protein, ur 30 (A) NEGATIVE mg/dL   Nitrite NEGATIVE NEGATIVE   Leukocytes,Ua LARGE (A) NEGATIVE   RBC / HPF 6-10 0 - 5 RBC/hpf   WBC, UA >50 (H) 0 - 5 WBC/hpf   Bacteria, UA FEW (A) NONE SEEN   Squamous Epithelial / LPF 0-5 0 - 5   Mucus PRESENT   CBC     Status: None   Collection Time: 07/27/19 12:20 PM  Result Value Ref Range   WBC 9.6 4.0 - 10.5 K/uL   RBC 4.34 3.87 - 5.11 MIL/uL   Hemoglobin 12.3 12.0 - 15.0 g/dL   HCT 37.8 36.0 - 46.0 %   MCV 87.1 80.0 - 100.0 fL   MCH 28.3 26.0 - 34.0 pg   MCHC 32.5 30.0 - 36.0 g/dL   RDW 15.2 11.5 - 15.5 %   Platelets 317 150 - 400 K/uL   nRBC 0.0 0.0 - 0.2 %  Comprehensive metabolic panel     Status: Abnormal   Collection Time: 07/27/19 12:20 PM  Result Value Ref Range   Sodium 136 135 - 145 mmol/L   Potassium 4.3 3.5 - 5.1 mmol/L   Chloride 104 98 - 111 mmol/L   CO2 23 22 - 32 mmol/L   Glucose, Bld 90 70 - 99 mg/dL   BUN <5 (L) 6 - 20 mg/dL   Creatinine, Ser 0.67 0.44 - 1.00 mg/dL   Calcium 9.0 8.9 - 10.3 mg/dL   Total Protein 6.6 6.5 - 8.1 g/dL   Albumin 3.2 (L) 3.5 - 5.0 g/dL   AST 17 15 - 41 U/L   ALT 19 0 - 44 U/L   Alkaline Phosphatase 84 38 - 126 U/L   Total Bilirubin 0.5 0.3 - 1.2 mg/dL   GFR calc non Af Amer >60 >60 mL/min   GFR calc Af Amer >60 >60 mL/min   Anion gap 9 5 - 15    US OB Transvaginal  Result Date: 07/27/2019 CLINICAL DATA:  Pregnancy.  Vaginal bleeding. EXAM: OBSTETRIC <14 WK [place] AND TRANSVAGINAL OB US TECHNIQUE: Both transabdominal and transvaginal ultrasound examinations were performed for complete evaluation of the gestation as well as the maternal uterus, adnexal regions, and pelvic cul-de-sac. Transvaginal technique was performed to assess early pregnancy. COMPARISON:  Ultrasound 07/18/2019. FINDINGS: Intrauterine gestational sac: Single Yolk sac:  Present Embryo:  Present Cardiac Activity: Present Heart Rate: 148 bpm CRL: 12 4  mm  7 w   3 d                  [place] EDC: 03/11/2020 Subchorionic hemorrhage: Small subchorionic hemorrhage again noted. Similar findings noted on prior exam Maternal uterus/adnexae: A 4.8 and 3.2 cm uterine fibroid again noted. Ovaries appears stable. No free pelvic fluid. IMPRESSION: 1.  Single viable intrauterine pregnancy at 7 weeks 3 days. 2. Small subchorionic hemorrhage again noted. Similar findings noted on prior exam. 2.  Two large uterine fibroids again noted. Electronically Signed   By: Marcello Moores  Register   On: 07/27/2019 13:41   MAU Course  Procedures  MDM Labs and [place] ordered and reviewed. Suspect undiagnosed CHTN, had elevated BP at [facility] visit last June. HA resolved. Viability confirmed on [place], small [place] seen. Will treat for suspected UTI, UC sent. BP stable, would not start meds yet but will likely need them. Discussed results and plan with pt. Stable for discharge home.   Assessment and Plan   1. [redacted] weeks gestation of pregnancy   2. Acute non intractable tension-type headache   3. Urinary tract infection in mother during first trimester of pregnancy   4. Blood type, Rh positive   5. Subchorionic hemorrhage of placenta in first trimester, fetus 1 of multiple gestation   6. Chronic hypertension in pregnancy    Discharge home Follow up at [facility] to start care in 3-4 weeks- pt awaiting return call from office SAB precautions Rx Fioricet Rx Keflex Return for worsening sx  Allergies as of 07/27/2019   No Known Allergies     Medication List    STOP taking these medications   hydrOXYzine 25 MG tablet Commonly known as: ATARAX/VISTARIL     TAKE these medications   butalbital-acetaminophen-caffeine 50-325-40 MG tablet Commonly known as: FIORICET Take 1-2 tablets by mouth every 6 (six) hours as needed for headache.   cephALEXin 500 MG capsule Commonly known as: KEFLEX Take 1 capsule (500 mg total) by mouth 3 (three) times daily for 7 days.      Julianne Handler, CNM 07/27/2019,  2:04 PM

## 2019-07-28 LAB — CULTURE, OB URINE: Culture: 20000 — AB

## 2019-08-09 ENCOUNTER — Encounter (HOSPITAL_COMMUNITY): Payer: Self-pay | Admitting: Obstetrics and Gynecology

## 2019-08-09 ENCOUNTER — Other Ambulatory Visit: Payer: Self-pay

## 2019-08-09 ENCOUNTER — Inpatient Hospital Stay (HOSPITAL_COMMUNITY)
Admission: AD | Admit: 2019-08-09 | Discharge: 2019-08-09 | Disposition: A | Discharge status: Home / Self Care | Payer: No Typology Code available for payment source | Attending: Obstetrics and Gynecology | Admitting: Obstetrics and Gynecology

## 2019-08-09 DIAGNOSIS — R109 Unspecified abdominal pain: Secondary | ICD-10-CM | POA: Diagnosis not present

## 2019-08-09 DIAGNOSIS — Z3A09 9 weeks gestation of pregnancy: Secondary | ICD-10-CM | POA: Insufficient documentation

## 2019-08-09 DIAGNOSIS — O26891 Other specified pregnancy related conditions, first trimester: Secondary | ICD-10-CM

## 2019-08-09 DIAGNOSIS — O10011 Pre-existing essential hypertension complicating pregnancy, first trimester: Secondary | ICD-10-CM | POA: Diagnosis not present

## 2019-08-09 DIAGNOSIS — R519 Headache, unspecified: Secondary | ICD-10-CM | POA: Diagnosis not present

## 2019-08-09 DIAGNOSIS — M797 Fibromyalgia: Secondary | ICD-10-CM | POA: Insufficient documentation

## 2019-08-09 HISTORY — DX: Anemia, unspecified: D64.9

## 2019-08-09 LAB — CBC
HCT: 37.2 % (ref 36.0–46.0)
Hemoglobin: 12.2 g/dL (ref 12.0–15.0)
MCH: 28.5 pg (ref 26.0–34.0)
MCHC: 32.8 g/dL (ref 30.0–36.0)
MCV: 86.9 fL (ref 80.0–100.0)
Platelets: 301 10*3/uL (ref 150–400)
RBC: 4.28 MIL/uL (ref 3.87–5.11)
RDW: 15.2 % (ref 11.5–15.5)
WBC: 8.2 10*3/uL (ref 4.0–10.5)
nRBC: 0 % (ref 0.0–0.2)

## 2019-08-09 LAB — BASIC METABOLIC PANEL
Anion gap: 7 (ref 5–15)
BUN: 5 mg/dL — ABNORMAL LOW (ref 6–20)
CO2: 24 mmol/L (ref 22–32)
Calcium: 8.9 mg/dL (ref 8.9–10.3)
Chloride: 104 mmol/L (ref 98–111)
Creatinine, Ser: 0.57 mg/dL (ref 0.44–1.00)
GFR calc Af Amer: >60 mL/min (ref >60)
GFR calc non Af Amer: >60 mL/min (ref >60)
Glucose, Bld: 77 mg/dL (ref 70–99)
Potassium: 4.1 mmol/L (ref 3.5–5.1)
Sodium: 135 mmol/L (ref 135–145)

## 2019-08-09 MED ORDER — PROMETHAZINE HCL 25 MG PO TABS
25.0000 mg | ORAL_TABLET | Freq: Four times a day (QID) | ORAL | 0 refills | Status: DC | PRN
Start: 2019-08-09 — End: 2019-10-20

## 2019-08-09 MED ORDER — DIPHENHYDRAMINE HCL 50 MG/ML IJ SOLN
25.0000 mg | Freq: Once | INTRAMUSCULAR | Status: AC
  Administered 2019-08-09: 25 mg via INTRAVENOUS
  Filled 2019-08-09: qty 1

## 2019-08-09 MED ORDER — DEXAMETHASONE SODIUM PHOSPHATE 10 MG/ML IJ SOLN
10.0000 mg | Freq: Once | INTRAMUSCULAR | Status: AC
  Administered 2019-08-09: 10 mg via INTRAVENOUS
  Filled 2019-08-09: qty 1

## 2019-08-09 MED ORDER — LACTATED RINGERS IV BOLUS
250.0000 mL | Freq: Once | INTRAVENOUS | Status: AC
  Administered 2019-08-09: 250 mL via INTRAVENOUS

## 2019-08-09 MED ORDER — METOCLOPRAMIDE HCL 5 MG/ML IJ SOLN
10.0000 mg | Freq: Once | INTRAMUSCULAR | Status: AC
  Administered 2019-08-09: 10 mg via INTRAVENOUS
  Filled 2019-08-09: qty 2

## 2019-08-09 NOTE — MAU Provider Note (Signed)
History     CSN: [id number]  Arrival date and time: 08/09/19 1223   First Provider Initiated Contact with Patient 08/09/19 1528      Chief Complaint  Patient presents with  . Abdominal Pain  . Headache   HPI Melissa Medina is a 49 y.o. [name] at [redacted]w[redacted]d who presents to MAU with multiple complaints:  Headache in pregnancy This is a recurrent problem. Patient endorses history of headaches and migraines which she managed with Botox injections pre-pregnancy. She has recently attempted management with Tylenol And Fioricet but is waking up with a headache most mornings. She rates her pain as 5/10. Her pain is anterior, bilateral, does not radiate. She denies aggravating or alleviating factors.   Diarrhea and Abdominal cramping Patient endorses a few episodes of diarrhea yesterday which resolved without intervention.  Hypertension Patient states she had a blood pressure reading on her home cuff of 200/180. She states she called her prenatal clinic and was told to come to MAU for evaluation. She states she was also told she could not drive herself and she did not have transportation so she did not present to MAU. She denies history of elevated blood pressure.  Fatigue Patient endorses extreme fatigue. She state she did not experience this level of fatigue during her previous pregnancies. She was previously diagnosed with Anemia and started on iron supplements. She verbalizes concern that she may be becoming more anemic.  Patient plans prenatal care with [facility]. Her New OB Intake appointment is 05/24 and her first Provider appointment is 08/25/2019.  OB History    Gravida  3   Para  2   Term  2   Preterm      AB      Living  2     SAB      TAB      Ectopic      Multiple      Live Births  2           Past Medical History:  Diagnosis Date  . Anemia   . Chronic headaches   . Fibromyalgia   . Herniated disc, cervical   . Migraine     Past  Surgical History:  Procedure Laterality Date  . APPENDECTOMY     childhood  . CHOLECYSTECTOMY    . TONSILLECTOMY     childhood    Family History  Problem Relation Age of Onset  . Hypertension Mother   . Diabetes Mother   . Kidney failure Mother   . Healthy Father     Social History   Tobacco Use  . Smoking status: Never Smoker  . Smokeless tobacco: Never Used  Substance Use Topics  . Alcohol use: No  . Drug use: No    Allergies: No Known Allergies  No medications prior to admission.    Review of Systems  Gastrointestinal: Positive for abdominal pain.  Genitourinary: Negative for vaginal bleeding.  Musculoskeletal: Negative for back pain.  Neurological: Positive for headaches.  All other systems reviewed and are negative.  Physical Exam   Blood pressure 115/79, pulse 78, temperature 98.5 F (36.9 C), temperature source Oral, resp. rate 18, last menstrual period 06/05/2019, SpO2 99 %.  Physical Exam  Nursing note and vitals reviewed. Constitutional: She is oriented to person, place, and time. She appears well-developed and well-nourished.  Cardiovascular: Normal rate and normal heart sounds.  Respiratory: Effort normal and breath sounds normal.  GI: Soft. Bowel sounds are normal. She exhibits  no distension. There is no abdominal tenderness. There is no rebound, no guarding and no CVA tenderness.  Musculoskeletal:        General: Normal range of motion.  Neurological: She is alert and oriented to person, place, and time.  Skin: Skin is warm and dry.  Psychiatric: She has a normal mood and affect. Her behavior is normal. Judgment and thought content normal.    MAU Course/MDM  Procedures  --Headache resolved with IV headache cocktail --No concerning findings on physical exam --Discussed that OTC Imodium is safe in pregnancy if patient experiences a recurrence of diarrhea --Reassured of normal CBC  Patient Vitals for the past 24 hrs:  BP Temp Temp src Pulse  Resp SpO2  08/09/19 1534 115/79 - - 78 18 -  08/09/19 1310 128/85 98.5 F (36.9 C) Oral 88 16 99 %   Results for orders placed or performed during the hospital encounter of 08/09/19 (from the past 24 hour(s))  CBC     Status: None   Collection Time: 08/09/19  1:55 PM  Result Value Ref Range   WBC 8.2 4.0 - 10.5 K/uL   RBC 4.28 3.87 - 5.11 MIL/uL   Hemoglobin 12.2 12.0 - 15.0 g/dL   HCT 37.2 36.0 - 46.0 %   MCV 86.9 80.0 - 100.0 fL   MCH 28.5 26.0 - 34.0 pg   MCHC 32.8 30.0 - 36.0 g/dL   RDW 15.2 11.5 - 15.5 %   Platelets 301 150 - 400 K/uL   nRBC 0.0 0.0 - 0.2 %  Basic metabolic panel     Status: Abnormal   Collection Time: 08/09/19  1:55 PM  Result Value Ref Range   Sodium 135 135 - 145 mmol/L   Potassium 4.1 3.5 - 5.1 mmol/L   Chloride 104 98 - 111 mmol/L   CO2 24 22 - 32 mmol/L   Glucose, Bld 77 70 - 99 mg/dL   BUN 5 (L) 6 - 20 mg/dL   Creatinine, Ser 0.57 0.44 - 1.00 mg/dL   Calcium 8.9 8.9 - 10.3 mg/dL   GFR calc non Af Amer >60 >60 mL/min   GFR calc Af Amer >60 >60 mL/min   Anion gap 7 5 - 15   Meds ordered this encounter  Medications  . metoCLOPramide (REGLAN) injection 10 mg  . diphenhydrAMINE (BENADRYL) injection 25 mg  . dexamethasone (DECADRON) injection 10 mg  . lactated ringers bolus 250 mL  . promethazine (PHENERGAN) 25 MG tablet    Sig: Take 1 tablet (25 mg total) by mouth every 6 (six) hours as needed for nausea or vomiting.    Dispense:  30 tablet    Refill:  0    Order Specific Question:   Supervising Provider    Answer:   CONSTANT, PEGGY [[id number]]   Assessment and Plan  --49 y.o. [id number] at [redacted]w[redacted]d  --Normotensive in MAU --Headache resolved with prescribed medications --Discharge home in stable condition  F/U: --[place] 05/24 and 06/02  Darlina Rumpf, [place] 08/09/2019, 6:01 PM

## 2019-08-09 NOTE — Discharge Instructions (Signed)
First Trimester of Pregnancy  The first trimester of pregnancy is from week 1 until the end of week 13 (months 1 through 3). During this time, your baby will begin to develop inside you. At 6-8 weeks, the eyes and face are formed, and the heartbeat can be seen on ultrasound. At the end of 12 weeks, all the baby's organs are formed. Prenatal care is all the medical care you receive before the birth of your baby. Make sure you get good prenatal care and follow all of your doctor's instructions. Follow these instructions at home: Medicines  Take over-the-counter and prescription medicines only as told by your doctor. Some medicines are safe and some medicines are not safe during pregnancy.  Take a prenatal vitamin that contains at least 600 micrograms (mcg) of folic acid.  If you have trouble pooping (constipation), take medicine that will make your stool soft (stool softener) if your doctor approves. Eating and drinking   Eat regular, healthy meals.  Your doctor will tell you the amount of weight gain that is right for you.  Avoid raw meat and uncooked cheese.  If you feel sick to your stomach (nauseous) or throw up (vomit): ? Eat 4 or 5 small meals a day instead of 3 large meals. ? Try eating a few soda crackers. ? Drink liquids between meals instead of during meals.  To prevent constipation: ? Eat foods that are high in fiber, like fresh fruits and vegetables, whole grains, and beans. ? Drink enough fluids to keep your pee (urine) clear or pale yellow. Activity  Exercise only as told by your doctor. Stop exercising if you have cramps or pain in your lower belly (abdomen) or low back.  Do not exercise if it is too hot, too humid, or if you are in a place of great height (high altitude).  Try to avoid standing for long periods of time. Move your legs often if you must stand in one place for a long time.  Avoid heavy lifting.  Wear low-heeled shoes. Sit and stand up  straight.  You can have sex unless your doctor tells you not to. Relieving pain and discomfort  Wear a good support bra if your breasts are sore.  Take warm water baths (sitz baths) to soothe pain or discomfort caused by hemorrhoids. Use hemorrhoid cream if your doctor says it is okay.  Rest with your legs raised if you have leg cramps or low back pain.  If you have puffy, bulging veins (varicose veins) in your legs: ? Wear support hose or compression stockings as told by your doctor. ? Raise (elevate) your feet for 15 minutes, 3-4 times a day. ? Limit salt in your food. Prenatal care  Schedule your prenatal visits by the twelfth week of pregnancy.  Write down your questions. Take them to your prenatal visits.  Keep all your prenatal visits as told by your doctor. This is important. Safety  Wear your seat belt at all times when driving.  Make a list of emergency phone numbers. The list should include numbers for family, friends, the hospital, and police and fire departments. General instructions  Ask your doctor for a referral to a local prenatal class. Begin classes no later than at the start of month 6 of your pregnancy.  Ask for help if you need counseling or if you need help with nutrition. Your doctor can give you advice or tell you where to go for help.  Do not use hot tubs, steam   rooms, or saunas.  Do not douche or use tampons or scented sanitary pads.  Do not cross your legs for long periods of time.  Avoid all herbs and alcohol. Avoid drugs that are not approved by your doctor.  Do not use any tobacco products, including cigarettes, chewing tobacco, and electronic cigarettes. If you need help quitting, ask your doctor. You may get counseling or other support to help you quit.  Avoid cat litter boxes and soil used by cats. These carry germs that can cause birth defects in the baby and can cause a loss of your baby (miscarriage) or stillbirth.  Visit your dentist.  At home, brush your teeth with a soft toothbrush. Be gentle when you floss. Contact a doctor if:  You are dizzy.  You have mild cramps or pressure in your lower belly.  You have a nagging pain in your belly area.  You continue to feel sick to your stomach, you throw up, or you have watery poop (diarrhea).  You have a bad smelling fluid coming from your vagina.  You have pain when you pee (urinate).  You have increased puffiness (swelling) in your face, hands, legs, or ankles. Get help right away if:  You have a fever.  You are leaking fluid from your vagina.  You have spotting or bleeding from your vagina.  You have very bad belly cramping or pain.  You gain or lose weight rapidly.  You throw up blood. It may look like coffee grounds.  You are around people who have German measles, fifth disease, or chickenpox.  You have a very bad headache.  You have shortness of breath.  You have any kind of trauma, such as from a fall or a car accident. Summary  The first trimester of pregnancy is from week 1 until the end of week 13 (months 1 through 3).  To take care of yourself and your unborn baby, you will need to eat healthy meals, take medicines only if your doctor tells you to do so, and do activities that are safe for you and your baby.  Keep all follow-up visits as told by your doctor. This is important as your doctor will have to ensure that your baby is healthy and growing well. This information is not intended to replace advice given to you by your health care provider. Make sure you discuss any questions you have with your health care provider. Document Revised: 07/02/2018 Document Reviewed: 03/19/2016 Elsevier Patient Education  2020 [employer]  

## 2019-08-09 NOTE — MAU Note (Signed)
Melissa Medina is a 49 y.o. at [redacted]w[redacted]d here in MAU reporting: the past week she has been having daily headaches. Has been checking BP and states it has been high. Had some diarrhea yesterday. States headache is mild right now, took some tylenol earlier and some lower abdominal pressure.  Onset of complaint: ongoing  Pain score: 5/10  Vitals:   08/09/19 1310  BP: 128/85  Pulse: 88  Resp: 16  Temp: 98.5 F (36.9 C)  SpO2: 99%     Lab orders placed from triage: UA

## 2019-08-16 ENCOUNTER — Other Ambulatory Visit: Payer: Self-pay

## 2019-08-16 ENCOUNTER — Telehealth: Payer: Self-pay | Admitting: *Deleted

## 2019-08-16 ENCOUNTER — Ambulatory Visit (INDEPENDENT_AMBULATORY_CARE_PROVIDER_SITE_OTHER): Payer: No Typology Code available for payment source | Admitting: *Deleted

## 2019-08-16 DIAGNOSIS — O09529 Supervision of elderly multigravida, unspecified trimester: Secondary | ICD-10-CM | POA: Insufficient documentation

## 2019-08-16 DIAGNOSIS — O0990 Supervision of high risk pregnancy, unspecified, unspecified trimester: Secondary | ICD-10-CM | POA: Insufficient documentation

## 2019-08-16 NOTE — Progress Notes (Signed)
10:40 I called Audra for her telephone visit and left a message I am calling for her telephone visit and since I did not reach her I will call again in a few minutes; please be available to answer by phone. Vedanth Sirico,RN 10:47 I called Carma again for her telephone visit and left another message I was calling for her telephone visit and since I did not reach her; it will need to be rescheduled by calling our office. I also called her contact number and spoke with a female. He said she is not with him and he will have her call our office. Tamra Koos,RN

## 2019-08-16 NOTE — Telephone Encounter (Signed)
Melissa Medina missed her new ob intake phone visit today and called back to front desk to reschedule.  She asked to speak with a nurse about problems. She reports her blood pressure at home is 220/115. I instructed her to go to [facility] MAU immediately to be evaluated and explained that is a very dangerous blood pressure for her and her baby. She confirmed she will go to the hospital now.  Aizik Reh,RN

## 2019-08-17 ENCOUNTER — Other Ambulatory Visit: Payer: Self-pay

## 2019-08-17 ENCOUNTER — Inpatient Hospital Stay (HOSPITAL_COMMUNITY)
Admission: AD | Admit: 2019-08-17 | Discharge: 2019-08-17 | Disposition: A | Discharge status: Home / Self Care | Payer: No Typology Code available for payment source | Attending: Obstetrics & Gynecology | Admitting: Obstetrics & Gynecology

## 2019-08-17 ENCOUNTER — Encounter (HOSPITAL_COMMUNITY): Payer: Self-pay | Admitting: Obstetrics & Gynecology

## 2019-08-17 DIAGNOSIS — M791 Myalgia, unspecified site: Secondary | ICD-10-CM | POA: Diagnosis not present

## 2019-08-17 DIAGNOSIS — R1031 Right lower quadrant pain: Secondary | ICD-10-CM | POA: Diagnosis not present

## 2019-08-17 DIAGNOSIS — Z3A10 10 weeks gestation of pregnancy: Secondary | ICD-10-CM | POA: Diagnosis not present

## 2019-08-17 DIAGNOSIS — R1032 Left lower quadrant pain: Secondary | ICD-10-CM | POA: Insufficient documentation

## 2019-08-17 DIAGNOSIS — R519 Headache, unspecified: Secondary | ICD-10-CM

## 2019-08-17 DIAGNOSIS — O26891 Other specified pregnancy related conditions, first trimester: Secondary | ICD-10-CM | POA: Diagnosis not present

## 2019-08-17 DIAGNOSIS — R1030 Lower abdominal pain, unspecified: Secondary | ICD-10-CM

## 2019-08-17 DIAGNOSIS — R52 Pain, unspecified: Secondary | ICD-10-CM | POA: Diagnosis present

## 2019-08-17 DIAGNOSIS — M797 Fibromyalgia: Secondary | ICD-10-CM | POA: Diagnosis present

## 2019-08-17 DIAGNOSIS — O0990 Supervision of high risk pregnancy, unspecified, unspecified trimester: Secondary | ICD-10-CM

## 2019-08-17 DIAGNOSIS — O99891 Other specified diseases and conditions complicating pregnancy: Secondary | ICD-10-CM

## 2019-08-17 LAB — WET PREP, GENITAL
Sperm: NONE SEEN
Trich, Wet Prep: NONE SEEN
Yeast Wet Prep HPF POC: NONE SEEN

## 2019-08-17 LAB — GC/CHLAMYDIA PROBE AMP (~~LOC~~) NOT AT ARMC
Chlamydia: NEGATIVE
Comment: NEGATIVE
Comment: NORMAL
Neisseria Gonorrhea: NEGATIVE

## 2019-08-17 MED ORDER — CYCLOBENZAPRINE HCL 5 MG PO TABS
10.0000 mg | ORAL_TABLET | Freq: Once | ORAL | Status: AC
  Administered 2019-08-17: 10 mg via ORAL
  Filled 2019-08-17: qty 2

## 2019-08-17 NOTE — MAU Note (Signed)
PT SAYS SHE HAD 2ND COVID VACCINE YESTERDAY - FELT OK BEFORE.  TOOK TYLENOL REG 1 TAB  AT 10AM- THEN AT 0200 FELT GROIN PRESSURE /   PAIN, H/A, TEMP- 99.  TOOK ANOTHER TYLENOL AT 0430. NO RELIEF.

## 2019-08-17 NOTE — Discharge Instructions (Signed)
DO NOT TAKE TYLENOL IF YOU TAKE FIORICET. DO NOT TALE FIORICET, IF YOU HAVE TAKEN TYLENOL.

## 2019-08-17 NOTE — MAU Provider Note (Signed)
History     CSN: [id number]  Arrival date and time: 08/17/19 0449   First Provider Initiated Contact with Patient 08/17/19 0554      Chief Complaint  Patient presents with  . Abdominal Pain   Ms. Melissa Medina is a 49 y.o. year old G32P2002 female at [redacted]w[redacted]d weeks gestation who presents to MAU reporting she received the second Covid vaccine this morning at 10 AM.  She felt fine before then.  She took 325 mg of Tylenol at 10 AM.  Then this morning at 2 AM she awakened with groin pain, low back pain, generalized pain, headache, and a temperature of 99.0.  She took another 325 mg of Tylenol at 430 this morning with no relief.  [facility]; her first appointment is next week.   OB History    Gravida  3   Para  2   Term  2   Preterm      AB      Living  2     SAB      TAB      Ectopic      Multiple      Live Births  2           Past Medical History:  Diagnosis Date  . Anemia   . Chronic headaches   . Fibromyalgia   . Herniated disc, cervical   . Migraine     Past Surgical History:  Procedure Laterality Date  . APPENDECTOMY     childhood  . CHOLECYSTECTOMY    . TONSILLECTOMY     childhood    Family History  Problem Relation Age of Onset  . Hypertension Mother   . Diabetes Mother   . Kidney failure Mother   . Healthy Father     Social History   Tobacco Use  . Smoking status: Never Smoker  . Smokeless tobacco: Never Used  Substance Use Topics  . Alcohol use: No  . Drug use: No    Allergies: No Known Allergies  Medications Prior to Admission  Medication Sig Dispense Refill Last Dose  . acetaminophen (TYLENOL) 325 MG tablet Take 325-650 mg by mouth every 6 (six) hours as needed for mild pain or headache.   08/17/2019 at Unknown time  . butalbital-acetaminophen-caffeine (FIORICET) 50-325-40 MG tablet Take 1-2 tablets by mouth every 6 (six) hours as needed for headache. 14 tablet 0 Past Month at  Unknown time  . ferrous sulfate 325 (65 FE) MG tablet Take 325 mg by mouth every other day.    08/16/2019 at Unknown time  . Prenatal Vit-Fe Fumarate-FA (PRENATAL MULTIVITAMIN) TABS tablet Take 1 tablet by mouth daily at 12 noon.   08/16/2019 at Unknown time  . vitamin C (ASCORBIC ACID) 250 MG tablet Take 250 mg by mouth every other day.    08/16/2019 at Unknown time  . promethazine (PHENERGAN) 25 MG tablet Take 1 tablet (25 mg total) by mouth every 6 (six) hours as needed for nausea or vomiting. 30 tablet 0 NONE    Review of Systems  Constitutional: Positive for fatigue and fever (low grade).  HENT: Negative.   Eyes: Negative.   Respiratory: Negative.   Cardiovascular: Negative.   Gastrointestinal: Negative.   Endocrine: Negative.   Genitourinary: Positive for pelvic pain (groin pain).  Musculoskeletal: Positive for back pain (lower) and myalgias.  Skin: Negative.   Allergic/Immunologic: Negative.   Neurological: Positive for headaches.  Hematological: Negative.   Psychiatric/Behavioral: Negative.    Physical Exam   Blood pressure 134/82, pulse (!) 121, temperature 99.1 F (37.3 C), resp. rate (!) 22, height 5\' 5"  (1.651 m), weight 98.7 kg, last menstrual period 06/05/2019, SpO2 98 %.  Physical Exam  Nursing note and vitals reviewed. Constitutional: She is oriented to person, place, and time. She appears well-developed and well-nourished.  HENT:  Head: Normocephalic and atraumatic.  Eyes: Pupils are equal, round, and reactive to light.  Cardiovascular: Normal rate.  Respiratory: Effort normal.  GI: Soft.  Pain in area of old surgical scar   Genitourinary:    Genitourinary Comments: Dilation: Closed Effacement (%): Thick Cervical Position: Posterior Presentation: Undeterminable Exam by: Sunday Corn, CNM    Musculoskeletal:        General: Normal range of motion.     Cervical back: Normal range of motion.  Neurological: She is alert and oriented to person, place, and time.   Skin: Skin is warm and dry.  Large abdominal scar from prior liposuction surgery.  Psychiatric: She has a normal mood and affect. Her behavior is normal. Judgment and thought content normal.    MAU Course  Procedures Patient informed that the ultrasound is considered a limited OB ultrasound and is not intended to be a complete ultrasound exam.  Patient also informed that the ultrasound is not being completed with the intent of assessing for fetal or placental anomalies or any pelvic abnormalities.  Explained that the purpose of today's ultrasound is to assess for viability.  Viable fetus that appears to be 10 wks in size with (+) cardiac activity at 165 bpm. Patient and spouse reassured Patient acknowledges the purpose of the exam and the limitations of the study.     MDM Flexeril 10 mg po -- did not resolve pain Patient requesting U/S since FHTs were not audible with doppler>>informal BS US done  Assessment and Plan  1. Generalized body aches - started after receiving COVID-19 2nd injection at 1000 2. Acute nonintractable headache, unspecified headache type - started after receiving COVID-19 2nd injection at 1000 3. Bilateral groin pain - started after receiving COVID-19 2nd injection at 1000  - Advised to start taking 3 of her regular strength Tylenol tablets every 6 hours as needed for pain - Advised to stay well-hydrated and rest - Advised that symptoms for COVID-19 vaccine may last 24-48 hours - Discharge patient - [facility] - Patient verbalized an understanding of the plan of care and agrees.     Laury Deep, MSN, CNM 08/17/2019, 6:03 AM

## 2019-08-25 ENCOUNTER — Ambulatory Visit (INDEPENDENT_AMBULATORY_CARE_PROVIDER_SITE_OTHER): Payer: No Typology Code available for payment source | Admitting: Obstetrics and Gynecology

## 2019-08-25 ENCOUNTER — Other Ambulatory Visit: Payer: Self-pay

## 2019-08-25 ENCOUNTER — Encounter: Payer: Self-pay | Admitting: Obstetrics and Gynecology

## 2019-08-25 ENCOUNTER — Encounter: Payer: Self-pay | Admitting: *Deleted

## 2019-08-25 VITALS — BP 118/79 | HR 105 | Temp 98.2°F | Wt 213.8 lb

## 2019-08-25 DIAGNOSIS — O0990 Supervision of high risk pregnancy, unspecified, unspecified trimester: Secondary | ICD-10-CM

## 2019-08-25 DIAGNOSIS — Z3A11 11 weeks gestation of pregnancy: Secondary | ICD-10-CM

## 2019-08-25 DIAGNOSIS — O09819 Supervision of pregnancy resulting from assisted reproductive technology, unspecified trimester: Secondary | ICD-10-CM | POA: Insufficient documentation

## 2019-08-25 DIAGNOSIS — O99351 Diseases of the nervous system complicating pregnancy, first trimester: Secondary | ICD-10-CM

## 2019-08-25 DIAGNOSIS — O99211 Obesity complicating pregnancy, first trimester: Secondary | ICD-10-CM

## 2019-08-25 DIAGNOSIS — O09811 Supervision of pregnancy resulting from assisted reproductive technology, first trimester: Secondary | ICD-10-CM

## 2019-08-25 DIAGNOSIS — G44229 Chronic tension-type headache, not intractable: Secondary | ICD-10-CM

## 2019-08-25 DIAGNOSIS — O99820 Streptococcus B carrier state complicating pregnancy: Secondary | ICD-10-CM

## 2019-08-25 DIAGNOSIS — E669 Obesity, unspecified: Secondary | ICD-10-CM

## 2019-08-25 DIAGNOSIS — R8271 Bacteriuria: Secondary | ICD-10-CM | POA: Insufficient documentation

## 2019-08-25 DIAGNOSIS — O09521 Supervision of elderly multigravida, first trimester: Secondary | ICD-10-CM

## 2019-08-25 LAB — POCT URINALYSIS DIP (DEVICE)
Glucose, UA: NEGATIVE mg/dL
Hgb urine dipstick: NEGATIVE
Ketones, ur: NEGATIVE mg/dL
Leukocytes,Ua: NEGATIVE
Nitrite: NEGATIVE
Protein, ur: NEGATIVE mg/dL
Specific Gravity, Urine: >=1.03 (ref 1.005–1.030)
Urobilinogen, UA: 0.2 mg/dL (ref 0.0–1.0)
pH: 5.5 (ref 5.0–8.0)

## 2019-08-25 MED ORDER — ASPIRIN EC 81 MG PO TBEC
81.0000 mg | DELAYED_RELEASE_TABLET | Freq: Every day | ORAL | 2 refills | Status: DC
Start: 2019-08-25 — End: 2020-02-23

## 2019-08-25 NOTE — Progress Notes (Signed)
[place] office notified to send records to [facility] for fetal echo referral.   Lanette Hampshire 08/25/19

## 2019-08-25 NOTE — Progress Notes (Signed)
INITIAL PRENATAL VISIT NOTE  Subjective:  Melissa Medina is a 49 y.o. [name] at [redacted]w[redacted]d by embryo transfer on 06/24/19 being seen today for her initial prenatal visit. This is a planned pregnancy. She and partner are happy with the pregnancy. She has an obstetric history significant for 2x term SVD. She has a medical history significant for migraines, fibromalaysia.  Patient reports headache and difficulty sleeping. Has had headaches before but since 2nd COVID shot last week, has been having daily headache. Taking tylenol but it is not helping. Given fioricet at [facility] but is not taking it. Was taking Botox injections for headaches prior to pregnancy. Contractions: Not present. Vag. Bleeding: None.  Movement: Absent. Denies leaking of fluid.   Past Medical History:  Diagnosis Date  . Anemia   . Anxiety   . Bilateral plantar fasciitis   . Chronic headaches   . Fibromyalgia   . Herniated disc, cervical   . Migraine   . Patellofemoral syndrome of left knee   . Patellofemoral syndrome of right knee   . PTSD (post-traumatic stress disorder)   . Sleep apnea     Past Surgical History:  Procedure Laterality Date  . APPENDECTOMY     childhood  . BREAST REDUCTION SURGERY  2009  . CHOLECYSTECTOMY    . LIPOSUCTION MULTIPLE BODY PARTS    . TONSILLECTOMY     childhood    OB History  Gravida Para Term Preterm AB Living  3 2 2     2   SAB TAB Ectopic Multiple Live Births          2    # Outcome Date GA Lbr Len/2nd Weight Sex Delivery Anes PTL Lv  3 Current           2 Term 1993   7 lb 8 oz (3.402 kg)  Vag-Spont   LIV  1 Term 1990   7 lb 8 oz (3.402 kg)  Vag-Spont   LIV    Social History   Socioeconomic History  . Marital status: Married    Spouse name: Not on file  . Number of children: Not on file  . Years of education: Not on file  . Highest education level: Not on file  Occupational History  . Not on file  Tobacco Use  . Smoking status: Never Smoker  . Smokeless tobacco:  Never Used  Substance and Sexual Activity  . Alcohol use: No  . Drug use: No  . Sexual activity: Yes  Other Topics Concern  . Not on file  Social History Narrative  . Not on file   Social Determinants of Health   Financial Resource Strain:   . Difficulty of Paying Living Expenses:   Food Insecurity: Food Insecurity Present  . Worried About Charity fundraiser in the Last Year: Sometimes true  . Ran Out of Food in the Last Year: Sometimes true  Transportation Needs: No Transportation Needs  . Lack of Transportation (Medical): No  . Lack of Transportation (Non-Medical): No  Physical Activity:   . Days of Exercise per Week:   . Minutes of Exercise per Session:   Stress:   . Feeling of Stress :   Social Connections:   . Frequency of Communication with Friends and Family:   . Frequency of Social Gatherings with Friends and Family:   . Attends Religious Services:   . Active Member of Clubs or Organizations:   . Attends Archivist Meetings:   Marland Kitchen Marital Status:  Family History  Problem Relation Age of Onset  . Hypertension Mother   . Diabetes Mother   . Kidney failure Mother   . Healthy Father      Current Outpatient Medications:  .  acetaminophen (TYLENOL) 325 MG tablet, Take 325-650 mg by mouth every 6 (six) hours as needed for mild pain or headache., Disp: , Rfl:  .  aluminum-magnesium hydroxide 200-200 MG/5ML suspension, Take by mouth every 6 (six) hours as needed for indigestion., Disp: , Rfl:  .  calcium carbonate (TUMS - DOSED IN MG ELEMENTAL CALCIUM) 500 MG chewable tablet, Chew 1 tablet by mouth daily., Disp: , Rfl:  .  ferrous sulfate 325 (65 FE) MG tablet, Take 325 mg by mouth every other day. , Disp: , Rfl:  .  Prenatal Vit-Fe Fumarate-FA (PRENATAL MULTIVITAMIN) TABS tablet, Take 1 tablet by mouth daily at 12 noon., Disp: , Rfl:  .  vitamin B-12 (CYANOCOBALAMIN) 500 MCG tablet, Take 1,000 mcg by mouth daily., Disp: , Rfl:  .  vitamin C (ASCORBIC  ACID) 250 MG tablet, Take 250 mg by mouth every other day. , Disp: , Rfl:  .  aspirin EC 81 MG tablet, Take 1 tablet (81 mg total) by mouth daily. Take after 12 weeks for prevention of preeclampsia later in pregnancy, Disp: 300 tablet, Rfl: 2 .  butalbital-acetaminophen-caffeine (FIORICET) 50-325-40 MG tablet, Take 1-2 tablets by mouth every 6 (six) hours as needed for headache. (Patient not taking: Reported on 08/25/2019), Disp: 14 tablet, Rfl: 0 .  promethazine (PHENERGAN) 25 MG tablet, Take 1 tablet (25 mg total) by mouth every 6 (six) hours as needed for nausea or vomiting. (Patient not taking: Reported on 08/25/2019), Disp: 30 tablet, Rfl: 0  No Known Allergies  Review of Systems: Negative except for what is mentioned in HPI.  Objective:   Vitals:   08/25/19 1030  BP: 118/79  Pulse: (!) 105  Temp: 98.2 F (36.8 C)    Fetal Status: Fetal Heart Rate (bpm): 164   Movement: Absent     Physical Exam: BP 118/79   Pulse (!) 105   Temp 98.2 F (36.8 C)   LMP 06/05/2019  CONSTITUTIONAL: Well-developed, well-nourished female in no acute distress.  NEUROLOGIC: Alert and oriented to person, place, and time. Normal reflexes, muscle tone coordination. No cranial nerve deficit noted. PSYCHIATRIC: Normal mood and affect. Normal behavior. Normal judgment and thought content. SKIN: Skin is warm and dry. No rash noted. Not diaphoretic. No erythema. No pallor. HENT:  Normocephalic, atraumatic, External right and left ear normal. Oropharynx is clear and moist EYES: Conjunctivae and EOM are normal. Pupils are equal, round, and reactive to light. No scleral icterus.  NECK: Normal range of motion, supple, no masses CARDIOVASCULAR: Normal heart rate noted, regular rhythm RESPIRATORY: Effort and breath sounds normal, no problems with respiration noted BREASTS: symmetric, non-tender, no masses palpable ABDOMEN: Soft, nontender, nondistended, gravid. GU: normal appearing external female genitalia,  nulliparous normal appearing cervix, scant white discharge in vagina, no lesions noted Bimanual: 11 weeks sized uterus, no adnexal tenderness or palpable lesions noted MUSCULOSKELETAL: Normal range of motion. EXT:  No edema and no tenderness. 2+ distal pulses.   Assessment and Plan:  Pregnancy: [name] at [redacted]w[redacted]d by embryo transfer, IVF pregnancy  1. Supervision of high risk pregnancy, antepartum - [facility] AMB BABYSCRIPTS SCHEDULE OPTIMIZATION - Genetic Screening - CBC/D/Plt+RPR+Rh+ABO+Rub Ab... - [place] MFM OB DETAIL +14 WK; Future - 2 hr GTT - pap and mammogram done at [facility] in fall, pt  to sign release today - Reviewed [facility] structure, multiple providers, fellows, medical students, virtual visits, MyChart.   2. Pregnancy resulting from in vitro fertilization in first trimester - donor egg - US Fetal Echocardiography; Future  3. Multigravida of advanced maternal age in first trimester  4. Group B streptococcal bacteriuria ppx in labor  5. Chronic tension-type headache, not intractable Take fioricet   Preterm labor symptoms and general obstetric precautions including but not limited to vaginal bleeding, contractions, leaking of fluid and fetal movement were reviewed in detail with the patient.  Please refer to After Visit Summary for other counseling recommendations.   Return in about 4 weeks (around 09/22/2019) for high OB, in person.  Sloan Leiter 08/25/2019 11:37 AM

## 2019-08-25 NOTE — Progress Notes (Signed)
Pt reports frequent H/A - not relieved with Tylenol. She had second Covid vaccine last week - headaches started after the vaccine.

## 2019-08-26 LAB — COMPREHENSIVE METABOLIC PANEL
ALT: 23 IU/L (ref 0–32)
AST: 19 IU/L (ref 0–40)
Albumin/Globulin Ratio: 1.5 (ref 1.2–2.2)
Albumin: 4 g/dL (ref 3.8–4.8)
Alkaline Phosphatase: 93 IU/L (ref 48–121)
BUN/Creatinine Ratio: 11 (ref 9–23)
BUN: 6 mg/dL (ref 6–24)
Bilirubin Total: 0.4 mg/dL (ref 0.0–1.2)
CO2: 23 mmol/L (ref 20–29)
Calcium: 9 mg/dL (ref 8.7–10.2)
Chloride: 99 mmol/L (ref 96–106)
Creatinine, Ser: 0.53 mg/dL — ABNORMAL LOW (ref 0.57–1.00)
GFR calc Af Amer: 128 mL/min/{1.73_m2} (ref >59)
GFR calc non Af Amer: 111 mL/min/{1.73_m2} (ref >59)
Globulin, Total: 2.7 g/dL (ref 1.5–4.5)
Glucose: 83 mg/dL (ref 65–99)
Potassium: 4.5 mmol/L (ref 3.5–5.2)
Sodium: 133 mmol/L — ABNORMAL LOW (ref 134–144)
Total Protein: 6.7 g/dL (ref 6.0–8.5)

## 2019-08-26 LAB — CBC/D/PLT+RPR+RH+ABO+RUB AB...
Antibody Screen: NEGATIVE
Basophils Absolute: 0.1 10*3/uL (ref 0.0–0.2)
Basos: 1 %
EOS (ABSOLUTE): 0.1 10*3/uL (ref 0.0–0.4)
Eos: 1 %
HCV Ab: <0.1 s/co ratio (ref 0.0–0.9)
HIV Screen 4th Generation wRfx: NONREACTIVE
Hematocrit: 36.8 % (ref 34.0–46.6)
Hemoglobin: 12.3 g/dL (ref 11.1–15.9)
Hepatitis B Surface Ag: NEGATIVE
Immature Grans (Abs): 0.1 10*3/uL (ref 0.0–0.1)
Immature Granulocytes: 1 %
Lymphocytes Absolute: 2 10*3/uL (ref 0.7–3.1)
Lymphs: 20 %
MCH: 29.3 pg (ref 26.6–33.0)
MCHC: 33.4 g/dL (ref 31.5–35.7)
MCV: 88 fL (ref 79–97)
Monocytes Absolute: 0.6 10*3/uL (ref 0.1–0.9)
Monocytes: 6 %
Neutrophils Absolute: 7.1 10*3/uL — ABNORMAL HIGH (ref 1.4–7.0)
Neutrophils: 71 %
Platelets: 328 10*3/uL (ref 150–450)
RBC: 4.2 x10E6/uL (ref 3.77–5.28)
RDW: 14.5 % (ref 11.7–15.4)
RPR Ser Ql: NONREACTIVE
Rh Factor: POSITIVE
Rubella Antibodies, IGG: 7.85 index (ref >0.99)
WBC: 9.9 10*3/uL (ref 3.4–10.8)

## 2019-08-26 LAB — HEMOGLOBIN A1C
Est. average glucose Bld gHb Est-mCnc: 114 mg/dL
Hgb A1c MFr Bld: 5.6 % (ref 4.8–5.6)

## 2019-08-26 LAB — PROTEIN / CREATININE RATIO, URINE
Creatinine, Urine: 281.2 mg/dL
Protein, Ur: 18.5 mg/dL
Protein/Creat Ratio: 66 mg/g creat (ref 0–200)

## 2019-08-26 LAB — HCV INTERPRETATION

## 2019-09-02 ENCOUNTER — Encounter: Payer: Self-pay | Admitting: *Deleted

## 2019-09-09 DIAGNOSIS — O219 Vomiting of pregnancy, unspecified: Secondary | ICD-10-CM

## 2019-09-13 MED ORDER — ONDANSETRON 4 MG PO TBDP: 4.0000 mg | ORAL_TABLET | Freq: Three times a day (TID) | ORAL | 2 refills | Status: DC | PRN

## 2019-09-22 ENCOUNTER — Ambulatory Visit (INDEPENDENT_AMBULATORY_CARE_PROVIDER_SITE_OTHER): Payer: No Typology Code available for payment source | Admitting: Family Medicine

## 2019-09-22 ENCOUNTER — Other Ambulatory Visit: Payer: Self-pay

## 2019-09-22 VITALS — BP 119/80 | HR 99 | Wt 219.0 lb

## 2019-09-22 DIAGNOSIS — O99612 Diseases of the digestive system complicating pregnancy, second trimester: Secondary | ICD-10-CM

## 2019-09-22 DIAGNOSIS — R8271 Bacteriuria: Secondary | ICD-10-CM

## 2019-09-22 DIAGNOSIS — O09522 Supervision of elderly multigravida, second trimester: Secondary | ICD-10-CM | POA: Diagnosis not present

## 2019-09-22 DIAGNOSIS — O26892 Other specified pregnancy related conditions, second trimester: Secondary | ICD-10-CM

## 2019-09-22 DIAGNOSIS — K219 Gastro-esophageal reflux disease without esophagitis: Secondary | ICD-10-CM

## 2019-09-22 DIAGNOSIS — O0990 Supervision of high risk pregnancy, unspecified, unspecified trimester: Secondary | ICD-10-CM

## 2019-09-22 DIAGNOSIS — O0992 Supervision of high risk pregnancy, unspecified, second trimester: Secondary | ICD-10-CM

## 2019-09-22 DIAGNOSIS — G43009 Migraine without aura, not intractable, without status migrainosus: Secondary | ICD-10-CM

## 2019-09-22 DIAGNOSIS — E669 Obesity, unspecified: Secondary | ICD-10-CM

## 2019-09-22 DIAGNOSIS — O99820 Streptococcus B carrier state complicating pregnancy: Secondary | ICD-10-CM

## 2019-09-22 DIAGNOSIS — O99212 Obesity complicating pregnancy, second trimester: Secondary | ICD-10-CM

## 2019-09-22 DIAGNOSIS — Z3A15 15 weeks gestation of pregnancy: Secondary | ICD-10-CM

## 2019-09-22 LAB — GLUCOSE, CAPILLARY: Glucose-Capillary: 93 mg/dL (ref 70–99)

## 2019-09-22 MED ORDER — PANTOPRAZOLE SODIUM 40 MG PO TBEC: 40.0000 mg | DELAYED_RELEASE_TABLET | Freq: Every day | ORAL | 1 refills | Status: DC

## 2019-09-22 MED ORDER — BUTALBITAL-APAP-CAFFEINE 50-325-40 MG PO TABS: 1.0000 | ORAL_TABLET | Freq: Four times a day (QID) | ORAL | 0 refills | Status: DC | PRN

## 2019-09-22 NOTE — Patient Instructions (Signed)

## 2019-09-22 NOTE — Progress Notes (Signed)
   PRENATAL VISIT NOTE  Subjective:  Melissa Medina is a 49 y.o. [name] at [redacted]w[redacted]d being seen today for ongoing prenatal care.  She is currently monitored for the following issues for this high-risk pregnancy and has Supervision of high risk pregnancy, antepartum; AMA (advanced maternal age) multigravida 78+; Acute nonintractable headache; Generalized body aches; Bilateral groin pain; Pregnancy resulting from in vitro fertilization; Group B streptococcal bacteriuria; Obesity; Abnormal uterine bleeding; Anxiety; Bilateral fibrocystic breast changes; Cough; Depression; Pain syndrome, chronic; Fibromyalgia; Chronic low back pain; Gastroesophageal reflux disease; History of cervical dysplasia; History of reduction mammoplasty; Iron deficiency anemia due to chronic blood loss; Menometrorrhagia; Pyelonephritis; Restless legs syndrome; Sleep apnea; Uterine fibroid; Vitamin D deficiency; Migraine without aura and responsive to treatment; and Posttraumatic stress disorder on their problem list.  Patient reports heartburn and vomiting.  Contractions: Not present. Vag. Bleeding: None.  Movement: Absent. Denies leaking of fluid.   The following portions of the patient's history were reviewed and updated as appropriate: allergies, current medications, past family history, past medical history, past social history, past surgical history and problem list.   Objective:   Vitals:   09/22/19 0829  BP: 119/80  Pulse: 99  Weight: 219 lb (99.3 kg)    Fetal Status: Fetal Heart Rate (bpm): 161   Movement: Absent     General:  Alert, oriented and cooperative. Patient is in no acute distress.  Skin: Skin is warm and dry. No rash noted.   Cardiovascular: Normal heart rate noted  Respiratory: Normal respiratory effort, no problems with respiration noted  Abdomen: Soft, gravid, appropriate for gestational age.  Pain/Pressure: Present     Pelvic: Cervical exam deferred        Extremities: Normal range of motion.   Edema: None  Mental Status: Normal mood and affect. Normal behavior. Normal judgment and thought content.   Assessment and Plan:  Pregnancy: [name] at [redacted]w[redacted]d 1. Supervision of high risk pregnancy, antepartum AFP today Anatomy u/s is scheduled - AFP, Serum, Open Spina Bifida  2. Multigravida of advanced maternal age in second trimester Normal Panorama and pre-implantation genetic testing Normal A1C--will check fasting CBG today--93 Begin ASA - POCT CBG (Fasting - Glucose)  3. Group B streptococcal bacteriuria Will need treatment in labor - Culture, OB Urine  4. Gastroesophageal reflux disease without esophagitis High dose PPI, failed Prilosec in the past. - pantoprazole (PROTONIX) 40 MG tablet; Take 1 tablet (40 mg total) by mouth daily.  Dispense: 60 tablet; Refill: 1  5. Migraine without aura and responsive to treatment Advised about rebound headache--try tylenol first - butalbital-acetaminophen-caffeine (FIORICET) 50-325-40 MG tablet; Take 1-2 tablets by mouth every 6 (six) hours as needed for headache.  Dispense: 21 tablet; Refill: 0  General obstetric precautions including but not limited to vaginal bleeding, contractions, leaking of fluid and fetal movement were reviewed in detail with the patient. Please refer to After Visit Summary for other counseling recommendations.   Return in about 4 weeks (around 10/20/2019) for in person.  Future Appointments  Date Time Provider [place]  10/20/2019  8:30 AM [facility] NURSE [facility]  10/20/2019  8:30 AM [facility]    Donnamae Jude, MD

## 2019-09-24 LAB — AFP, SERUM, OPEN SPINA BIFIDA
AFP MoM: 1.68
AFP Value: 38.2 ng/mL
Gest. Age on Collection Date: 16.3 weeks
Maternal Age At EDD: 51.4 yr
OSBR Risk 1 IN: 511
Test Results:: NEGATIVE
Weight: 219 [lb_av]

## 2019-09-26 LAB — URINE CULTURE, OB REFLEX

## 2019-09-26 LAB — CULTURE, OB URINE

## 2019-09-27 MED ORDER — AMOXICILLIN 500 MG PO CAPS
500.0000 mg | ORAL_CAPSULE | Freq: Three times a day (TID) | ORAL | 0 refills | Status: AC
Start: 2019-09-27 — End: 2019-10-04

## 2019-09-27 NOTE — Addendum Note (Signed)
Addended by: Donnamae Jude on: 09/27/2019 07:47 AM   Modules accepted: Orders

## 2019-10-13 ENCOUNTER — Encounter (HOSPITAL_COMMUNITY): Payer: Self-pay | Admitting: Obstetrics and Gynecology

## 2019-10-13 ENCOUNTER — Other Ambulatory Visit: Payer: Self-pay

## 2019-10-13 ENCOUNTER — Inpatient Hospital Stay (HOSPITAL_COMMUNITY)
Admission: AD | Admit: 2019-10-13 | Discharge: 2019-10-13 | Disposition: A | Discharge status: Home / Self Care | Payer: No Typology Code available for payment source | Attending: Obstetrics and Gynecology | Admitting: Obstetrics and Gynecology

## 2019-10-13 DIAGNOSIS — O0990 Supervision of high risk pregnancy, unspecified, unspecified trimester: Secondary | ICD-10-CM

## 2019-10-13 DIAGNOSIS — O99820 Streptococcus B carrier state complicating pregnancy: Secondary | ICD-10-CM | POA: Diagnosis not present

## 2019-10-13 DIAGNOSIS — N949 Unspecified condition associated with female genital organs and menstrual cycle: Secondary | ICD-10-CM | POA: Diagnosis present

## 2019-10-13 DIAGNOSIS — Z79899 Other long term (current) drug therapy: Secondary | ICD-10-CM | POA: Diagnosis not present

## 2019-10-13 DIAGNOSIS — R102 Pelvic and perineal pain: Secondary | ICD-10-CM | POA: Insufficient documentation

## 2019-10-13 DIAGNOSIS — Z7982 Long term (current) use of aspirin: Secondary | ICD-10-CM | POA: Insufficient documentation

## 2019-10-13 DIAGNOSIS — R519 Headache, unspecified: Secondary | ICD-10-CM | POA: Diagnosis not present

## 2019-10-13 DIAGNOSIS — Z3A18 18 weeks gestation of pregnancy: Secondary | ICD-10-CM | POA: Insufficient documentation

## 2019-10-13 DIAGNOSIS — M797 Fibromyalgia: Secondary | ICD-10-CM | POA: Diagnosis not present

## 2019-10-13 DIAGNOSIS — O26892 Other specified pregnancy related conditions, second trimester: Secondary | ICD-10-CM | POA: Insufficient documentation

## 2019-10-13 DIAGNOSIS — Z87440 Personal history of urinary (tract) infections: Secondary | ICD-10-CM | POA: Diagnosis not present

## 2019-10-13 DIAGNOSIS — O0992 Supervision of high risk pregnancy, unspecified, second trimester: Secondary | ICD-10-CM | POA: Diagnosis not present

## 2019-10-13 DIAGNOSIS — O99891 Other specified diseases and conditions complicating pregnancy: Secondary | ICD-10-CM | POA: Diagnosis not present

## 2019-10-13 DIAGNOSIS — R109 Unspecified abdominal pain: Secondary | ICD-10-CM

## 2019-10-13 DIAGNOSIS — O09522 Supervision of elderly multigravida, second trimester: Secondary | ICD-10-CM | POA: Diagnosis not present

## 2019-10-13 DIAGNOSIS — G4730 Sleep apnea, unspecified: Secondary | ICD-10-CM | POA: Diagnosis not present

## 2019-10-13 HISTORY — DX: Benign neoplasm of connective and other soft tissue, unspecified: D21.9

## 2019-10-13 LAB — URINALYSIS, ROUTINE W REFLEX MICROSCOPIC
Bilirubin Urine: NEGATIVE
Glucose, UA: NEGATIVE mg/dL
Hgb urine dipstick: NEGATIVE
Ketones, ur: NEGATIVE mg/dL
Leukocytes,Ua: NEGATIVE
Nitrite: NEGATIVE
Protein, ur: NEGATIVE mg/dL
Specific Gravity, Urine: 1.003 — ABNORMAL LOW (ref 1.005–1.030)
pH: 6 (ref 5.0–8.0)

## 2019-10-13 NOTE — MAU Note (Signed)
Denies fever, cough, vomiting or diarrhea, no loss of smell or taste.  Neg covid test prior to returning.  Reports sneezing.

## 2019-10-13 NOTE — MAU Provider Note (Signed)
Patient Melissa Medina is a 49 y.o. [name] at [redacted]w[redacted]d here with complaints of nose bleed last week, and some aches in her sides. She is concerned because she flew to [place] last week and wants to know if everything is ok. She is asking for an [place] to "check on the baby".  She was concerned about the Delta variant for COVID-19. She denies vaginal bleeding, LOF, contractions.    She was recently treated for GBS in her urine with antibiotics; she declines any UTI symptoms at this time.  History     CSN: [id number]  Arrival date and time: 10/13/19 1713   First Provider Initiated Contact with Patient 10/13/19 1831      Chief Complaint  Patient presents with  . Headache  . Abdominal Pain  . Shoulder Pain   Abdominal Pain This is a new problem. The current episode started today. The pain is at a severity of 5/10. The quality of the pain is cramping. The abdominal pain radiates to the periumbilical region. Pertinent negatives include no constipation, diarrhea, dysuria, nausea or vomiting.  She has not tried anything for it.   OB History    Gravida  3   Para  2   Term  2   Preterm      AB      Living  2     SAB      TAB      Ectopic      Multiple      Live Births  2           Past Medical History:  Diagnosis Date  . Anemia   . Anxiety   . Bilateral plantar fasciitis   . Chronic headaches   . Fibroid   . Fibromyalgia   . Herniated disc, cervical   . Infection    UTI  . Migraine   . Patellofemoral syndrome of left knee   . Patellofemoral syndrome of right knee   . PTSD (post-traumatic stress disorder)   . PTSD (post-traumatic stress disorder)   . Sleep apnea     Past Surgical History:  Procedure Laterality Date  . APPENDECTOMY     childhood  . BREAST REDUCTION SURGERY  2009  . CHOLECYSTECTOMY    . DILATION AND CURETTAGE OF UTERUS    . LIPOSUCTION MULTIPLE BODY PARTS    . TONSILLECTOMY     childhood    Family History  Problem Relation Age of Onset   . Hypertension Mother   . Diabetes Mother   . Kidney failure Mother   . Healthy Father     Social History   Tobacco Use  . Smoking status: Never Smoker  . Smokeless tobacco: Never Used  Vaping Use  . Vaping Use: Never used  Substance Use Topics  . Alcohol use: No  . Drug use: No    Allergies: No Known Allergies  Medications Prior to Admission  Medication Sig Dispense Refill Last Dose  . acetaminophen (TYLENOL) 325 MG tablet Take 325-650 mg by mouth every 6 (six) hours as needed for mild pain or headache.   10/12/2019 at Unknown time  . aspirin EC 81 MG tablet Take 1 tablet (81 mg total) by mouth daily. Take after 12 weeks for prevention of preeclampsia later in pregnancy 300 tablet 2 10/13/2019 at Unknown time  . butalbital-acetaminophen-caffeine (FIORICET) 50-325-40 MG tablet Take 1-2 tablets by mouth every 6 (six) hours as needed for headache. 21 tablet 0 Past Month at Unknown time  .  ferrous sulfate 325 (65 FE) MG tablet Take 325 mg by mouth every other day.    10/12/2019 at Unknown time  . ondansetron (ZOFRAN ODT) 4 MG disintegrating tablet Take 1 tablet (4 mg total) by mouth every 8 (eight) hours as needed for nausea or vomiting. 30 tablet 2 Past Month at Unknown time  . pantoprazole (PROTONIX) 40 MG tablet Take 1 tablet (40 mg total) by mouth daily. 60 tablet 1 10/13/2019 at Unknown time  . Prenatal Vit-Fe Fumarate-FA (PRENATAL MULTIVITAMIN) TABS tablet Take 1 tablet by mouth daily at 12 noon.   10/13/2019 at Unknown time  . promethazine (PHENERGAN) 25 MG tablet Take 1 tablet (25 mg total) by mouth every 6 (six) hours as needed for nausea or vomiting. 30 tablet 0 Past Month at Unknown time  . vitamin B-12 (CYANOCOBALAMIN) 500 MCG tablet Take 1,000 mcg by mouth daily.   10/13/2019 at Unknown time  . vitamin C (ASCORBIC ACID) 250 MG tablet Take 250 mg by mouth every other day.    10/13/2019 at Unknown time  . aluminum-magnesium hydroxide 200-200 MG/5ML suspension Take by mouth every 6  (six) hours as needed for indigestion.     . calcium carbonate (TUMS - DOSED IN MG ELEMENTAL CALCIUM) 500 MG chewable tablet Chew 1 tablet by mouth daily.   More than a month at Unknown time    Review of Systems  Constitutional: Negative.   HENT: Negative.   Gastrointestinal: Positive for abdominal pain. Negative for constipation, diarrhea, nausea and vomiting.  Genitourinary: Negative for dysuria.  Musculoskeletal: Negative.   Neurological: Negative.   Psychiatric/Behavioral: Negative.    Physical Exam   Blood pressure 137/87, pulse 98, temperature 98.8 F (37.1 C), temperature source Oral, resp. rate 18, height 5\' 5"  (1.651 m), weight 101.3 kg, last menstrual period 06/05/2019, SpO2 99 %.  Physical Exam Constitutional:      Appearance: She is well-developed.  Abdominal:     Palpations: Abdomen is soft.  Musculoskeletal:     Cervical back: Normal range of motion.  Skin:    General: Skin is warm.  Neurological:     Mental Status: She is alert.   Cervix is long, closed and thick.   MAU Course  Procedures  MDM FHR: 156 -Declines GC CT pending -Reassured patient that she is not in labor, explained that her pain is probably related to round ligament pain and fibroids in the uterus can be very uncomfortable. Explained that [place] not necessary at this point with detectable fetal heart tones. Cervix is long, closed and thick, no need to measure cervical length at this time.   Assessment and Plan   1. Round ligament pain   2. Supervision of high risk pregnancy, antepartum   3. Multigravida of advanced maternal age in second trimester   4. Abdominal pain during pregnancy in second trimester    -Return precautions given, comfort measures discussed.  -Patient plans to keep apt on July 28 -All questions answered   Mervyn Skeeters [facility] 10/13/2019, 6:56 PM

## 2019-10-13 NOTE — MAU Note (Addendum)
Flew to [place] 7/10, returned 7/17.  5 days while there had bloody nose, swollen legs. Some BP elevation.  Since returning, legs have been swollen off and on (not today), has been having cramping in lower abd, and in left shoulder.  Since yesterday has had a HA.  Took 2 Tylenol, helped a little bit, but has returned.  Had bloody nose once since returning- that was Monday.

## 2019-10-13 NOTE — Progress Notes (Signed)
[name] CNM in earlier to discuss test results and d/c plan with pt. Written and verbal d/c instructions given and understanding voiced.

## 2019-10-18 ENCOUNTER — Ambulatory Visit: Payer: No Typology Code available for payment source | Attending: Internal Medicine

## 2019-10-18 DIAGNOSIS — Z20822 Contact with and (suspected) exposure to covid-19: Secondary | ICD-10-CM

## 2019-10-19 LAB — NOVEL CORONAVIRUS, NAA: SARS-CoV-2, NAA: NOT DETECTED

## 2019-10-19 LAB — SARS-COV-2, NAA 2 DAY TAT

## 2019-10-20 ENCOUNTER — Ambulatory Visit (INDEPENDENT_AMBULATORY_CARE_PROVIDER_SITE_OTHER): Payer: No Typology Code available for payment source | Admitting: Obstetrics and Gynecology

## 2019-10-20 ENCOUNTER — Other Ambulatory Visit: Payer: Self-pay

## 2019-10-20 ENCOUNTER — Encounter: Payer: Self-pay | Admitting: General Practice

## 2019-10-20 ENCOUNTER — Other Ambulatory Visit: Payer: Self-pay | Admitting: *Deleted

## 2019-10-20 ENCOUNTER — Ambulatory Visit: Payer: No Typology Code available for payment source | Attending: Obstetrics and Gynecology

## 2019-10-20 ENCOUNTER — Ambulatory Visit: Payer: No Typology Code available for payment source | Admitting: *Deleted

## 2019-10-20 VITALS — BP 125/80 | HR 101 | Wt 220.0 lb

## 2019-10-20 DIAGNOSIS — O99212 Obesity complicating pregnancy, second trimester: Secondary | ICD-10-CM

## 2019-10-20 DIAGNOSIS — O0990 Supervision of high risk pregnancy, unspecified, unspecified trimester: Secondary | ICD-10-CM

## 2019-10-20 DIAGNOSIS — O09812 Supervision of pregnancy resulting from assisted reproductive technology, second trimester: Secondary | ICD-10-CM

## 2019-10-20 DIAGNOSIS — O3412 Maternal care for benign tumor of corpus uteri, second trimester: Secondary | ICD-10-CM

## 2019-10-20 DIAGNOSIS — O09522 Supervision of elderly multigravida, second trimester: Secondary | ICD-10-CM

## 2019-10-20 DIAGNOSIS — E669 Obesity, unspecified: Secondary | ICD-10-CM

## 2019-10-20 DIAGNOSIS — Z3A19 19 weeks gestation of pregnancy: Secondary | ICD-10-CM

## 2019-10-20 DIAGNOSIS — Z363 Encounter for antenatal screening for malformations: Secondary | ICD-10-CM | POA: Diagnosis not present

## 2019-10-20 DIAGNOSIS — O0992 Supervision of high risk pregnancy, unspecified, second trimester: Secondary | ICD-10-CM

## 2019-10-20 DIAGNOSIS — O99820 Streptococcus B carrier state complicating pregnancy: Secondary | ICD-10-CM

## 2019-10-20 DIAGNOSIS — R8271 Bacteriuria: Secondary | ICD-10-CM

## 2019-10-20 NOTE — Progress Notes (Signed)
   PRENATAL VISIT NOTE  Subjective:  Melissa Medina is a 49 y.o. [name] at [redacted]w[redacted]d being seen today for ongoing prenatal care.  She is currently monitored for the following issues for this high-risk pregnancy and has Supervision of high risk pregnancy, antepartum; AMA (advanced maternal age) multigravida 78+; Acute nonintractable headache; Generalized body aches; Bilateral groin pain; Pregnancy resulting from in vitro fertilization; Group B streptococcal bacteriuria; Obesity; Abnormal uterine bleeding; Anxiety; Bilateral fibrocystic breast changes; Cough; Depression; Pain syndrome, chronic; Fibromyalgia; Chronic low back pain; Gastroesophageal reflux disease; History of cervical dysplasia; History of reduction mammoplasty; Iron deficiency anemia due to chronic blood loss; Menometrorrhagia; Pyelonephritis; Restless legs syndrome; Sleep apnea; Uterine fibroid; Vitamin D deficiency; Migraine without aura and responsive to treatment; Posttraumatic stress disorder; and Round ligament pain on their problem list.  Patient doing well with no acute concerns today. She reports no complaints.  Contractions: Not present. Vag. Bleeding: None.  Movement: Present. Denies leaking of fluid.   The following portions of the patient's history were reviewed and updated as appropriate: allergies, current medications, past family history, past medical history, past social history, past surgical history and problem list. Problem list updated.  Objective:   Vitals:   10/20/19 1343  BP: 125/80  Pulse: 101  Weight: (!) 220 lb (99.8 kg)    Fetal Status: Fetal Heart Rate (bpm): 151   Movement: Present     General:  Alert, oriented and cooperative. Patient is in no acute distress.  Skin: Skin is warm and dry. No rash noted.   Cardiovascular: Normal heart rate noted  Respiratory: Normal respiratory effort, no problems with respiration noted  Abdomen: Soft, gravid, appropriate for gestational age.  Pain/Pressure: Absent      Pelvic: Cervical exam deferred        Extremities: Normal range of motion.  Edema: None  Mental Status:  Normal mood and affect. Normal behavior. Normal judgment and thought content.   Assessment and Plan:  Pregnancy: [name] at [redacted]w[redacted]d  1. Supervision of high risk pregnancy, antepartum Pt continues with baby ASA and protonix  2. Multigravida of advanced maternal age in second trimester Reviewed genetic testing even though this was donor egg, testing was negative  3. Pregnancy resulting from in vitro fertilization in second trimester Anatomy scan done today, attempting to get fetal echo scheduled  4. Group B streptococcal bacteriuria Treat in labor  Preterm labor symptoms and general obstetric precautions including but not limited to vaginal bleeding, contractions, leaking of fluid and fetal movement were reviewed in detail with the patient. Pt has requested work note to be able to work from home during [employer] drill so she is not exposed to a large number of participants during the exercise.  She is slightly immunocompromised from the pregnancy and would prefer to miss the exposure during her condition. Please refer to After Visit Summary for other counseling recommendations.   Return in about 4 weeks (around 11/17/2019) for [facility], in person.   Lynnda Shields, MD

## 2019-11-17 ENCOUNTER — Ambulatory Visit: Payer: No Typology Code available for payment source

## 2019-11-17 ENCOUNTER — Encounter: Payer: No Typology Code available for payment source | Admitting: Family Medicine

## 2019-11-22 ENCOUNTER — Ambulatory Visit (HOSPITAL_BASED_OUTPATIENT_CLINIC_OR_DEPARTMENT_OTHER): Payer: No Typology Code available for payment source

## 2019-11-22 ENCOUNTER — Ambulatory Visit: Payer: No Typology Code available for payment source | Attending: Obstetrics and Gynecology | Admitting: *Deleted

## 2019-11-22 ENCOUNTER — Encounter: Payer: Self-pay | Admitting: *Deleted

## 2019-11-22 ENCOUNTER — Other Ambulatory Visit: Payer: Self-pay

## 2019-11-22 ENCOUNTER — Other Ambulatory Visit: Payer: Self-pay | Admitting: *Deleted

## 2019-11-22 DIAGNOSIS — O09812 Supervision of pregnancy resulting from assisted reproductive technology, second trimester: Secondary | ICD-10-CM | POA: Diagnosis not present

## 2019-11-22 DIAGNOSIS — E669 Obesity, unspecified: Secondary | ICD-10-CM

## 2019-11-22 DIAGNOSIS — Z3A24 24 weeks gestation of pregnancy: Secondary | ICD-10-CM | POA: Diagnosis not present

## 2019-11-22 DIAGNOSIS — O3412 Maternal care for benign tumor of corpus uteri, second trimester: Secondary | ICD-10-CM

## 2019-11-22 DIAGNOSIS — Z362 Encounter for other antenatal screening follow-up: Secondary | ICD-10-CM | POA: Diagnosis not present

## 2019-11-22 DIAGNOSIS — O0990 Supervision of high risk pregnancy, unspecified, unspecified trimester: Secondary | ICD-10-CM

## 2019-11-22 DIAGNOSIS — O99212 Obesity complicating pregnancy, second trimester: Secondary | ICD-10-CM | POA: Insufficient documentation

## 2019-11-22 DIAGNOSIS — O09522 Supervision of elderly multigravida, second trimester: Secondary | ICD-10-CM | POA: Insufficient documentation

## 2019-11-22 DIAGNOSIS — Z6833 Body mass index (BMI) 33.0-33.9, adult: Secondary | ICD-10-CM

## 2019-11-22 DIAGNOSIS — O09529 Supervision of elderly multigravida, unspecified trimester: Secondary | ICD-10-CM

## 2019-11-25 ENCOUNTER — Encounter: Payer: Self-pay | Admitting: *Deleted

## 2019-12-01 ENCOUNTER — Encounter: Payer: Self-pay | Admitting: Medical

## 2019-12-01 ENCOUNTER — Telehealth (INDEPENDENT_AMBULATORY_CARE_PROVIDER_SITE_OTHER): Payer: No Typology Code available for payment source | Admitting: Lactation Services

## 2019-12-01 DIAGNOSIS — O0990 Supervision of high risk pregnancy, unspecified, unspecified trimester: Secondary | ICD-10-CM

## 2019-12-01 NOTE — Telephone Encounter (Addendum)
Returned patients call. She reports for the past week she has been having pain all over her body. Pain in her stomach "like when you eat something bad". She reports shortness of breath for 30 seconds a few times last night. She had some right arm pain 2 times in the last week, feels like cramping. Once she was sitting and once driving, reviewed it could be something like carpel tunnel.   She reports she feels like it is her digestive system that is hurting. She has been feeling a lot of cramping off and on, she does not feel like it is her uterus.   She reports sometimes the infant moves a lot and then some days she does not feel the baby move as well. She is laying on her side and reports movements are at least 10 times an hour for the most part. Reviewed that at his size it may be more difficult to feel him move depending on where he is laying.   Patient reports she is eating way less than she usually does. She eats a few bites and then gets full. She reports she is gaining weight slowly. She is eating cheese, she does not like nuts. She is drinking Ensure once a day and is able to keep that down. She is eating fruit and not eating vegetables right now. Enc her to add another Ensure and to add more high calories, healthy foods like Avocado to her diet. She reports she is drinking well.   Reviewed elevating head at night with another pillow. She is not sleeping well. She has tried Tylenol PM once and is afraid to keep taking, advised her to take nightly to see if it will help her with sleep.   She had a headache and some dizziness one day this week. Advised her to sit and take blood pressure. Advised increasing fluids and trying to eat when feeling dizzy. She reports when she takes her BP it is not elevated.    She works during the day, enc her to ask for help with household duties so she can rest in the evening if needed.   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 She voiced understanding.   Reviewed with patient that if she is having difficulty and is scared that she is having a heart attack or there is something concerning with the pregnancy, it is certainly ok to seek emergency care for evaluation.   She did voice that she is feeling anxious and scared at times, reviewed that what she is feeling is validated and normal for a pregnant woman. She reports she felt batter after reviewing all the above.   Today she is not feeling any of the above symptoms except the tiredness.   Reviewed case with Dr. Astrid Drafts, She recommended that patient continue the Tylenol PM or Benadryl nightly to see id it helps with sleep. Patient to follow up in the office on Friday, she is to call or message back with further concerns as needed.

## 2019-12-03 ENCOUNTER — Ambulatory Visit (INDEPENDENT_AMBULATORY_CARE_PROVIDER_SITE_OTHER): Payer: No Typology Code available for payment source | Admitting: Medical

## 2019-12-03 ENCOUNTER — Other Ambulatory Visit: Payer: Self-pay

## 2019-12-03 ENCOUNTER — Encounter: Payer: Self-pay | Admitting: Medical

## 2019-12-03 VITALS — BP 121/83 | HR 101 | Wt 223.1 lb

## 2019-12-03 DIAGNOSIS — O09812 Supervision of pregnancy resulting from assisted reproductive technology, second trimester: Secondary | ICD-10-CM

## 2019-12-03 DIAGNOSIS — K219 Gastro-esophageal reflux disease without esophagitis: Secondary | ICD-10-CM

## 2019-12-03 DIAGNOSIS — Z3A25 25 weeks gestation of pregnancy: Secondary | ICD-10-CM

## 2019-12-03 DIAGNOSIS — N949 Unspecified condition associated with female genital organs and menstrual cycle: Secondary | ICD-10-CM

## 2019-12-03 DIAGNOSIS — D500 Iron deficiency anemia secondary to blood loss (chronic): Secondary | ICD-10-CM

## 2019-12-03 DIAGNOSIS — O0990 Supervision of high risk pregnancy, unspecified, unspecified trimester: Secondary | ICD-10-CM

## 2019-12-03 DIAGNOSIS — R8271 Bacteriuria: Secondary | ICD-10-CM

## 2019-12-03 DIAGNOSIS — O09522 Supervision of elderly multigravida, second trimester: Secondary | ICD-10-CM

## 2019-12-03 NOTE — Patient Instructions (Addendum)
D  Second Trimester of Pregnancy  The second trimester is from week 14 through week 27 (month 4 through 6). This is often the time in pregnancy that you feel your best. Often times, morning sickness has lessened or quit. You may have more energy, and you may get hungry more often. Your unborn baby is growing rapidly. At the end of the sixth month, he or she is about 9 inches long and weighs about 1 pounds. You will likely feel the baby move between 18 and 20 weeks of pregnancy. Follow these instructions at home: Medicines  Take over-the-counter and prescription medicines only as told by your doctor. Some medicines are safe and some medicines are not safe during pregnancy.  Take a prenatal vitamin that contains at least 600 micrograms (mcg) of folic acid.  If you have trouble pooping (constipation), take medicine that will make your stool soft (stool softener) if your doctor approves. Eating and drinking   Eat regular, healthy meals.  Avoid raw meat and uncooked cheese.  If you get low calcium from the food you eat, talk to your doctor about taking a daily calcium supplement.  Avoid foods that are high in fat and sugars, such as fried and sweet foods.  If you feel sick to your stomach (nauseous) or throw up (vomit): ? Eat 4 or 5 small meals a day instead of 3 large meals. ? Try eating a few soda crackers. ? Drink liquids between meals instead of during meals.  To prevent constipation: ? Eat foods that are high in fiber, like fresh fruits and vegetables, whole grains, and beans. ? Drink enough fluids to keep your pee (urine) clear or pale yellow. Activity  Exercise only as told by your doctor. Stop exercising if you start to have cramps.  Do not exercise if it is too hot, too humid, or if you are in a place of great height (high altitude).  Avoid heavy lifting.  Wear low-heeled shoes. Sit and stand up straight.  You can continue to have sex unless your doctor tells you not  to. Relieving pain and discomfort  Wear a good support bra if your breasts are tender.  Take warm water baths (sitz baths) to soothe pain or discomfort caused by hemorrhoids. Use hemorrhoid cream if your doctor approves.  Rest with your legs raised if you have leg cramps or low back pain.  If you develop puffy, bulging veins (varicose veins) in your legs: ? Wear support hose or compression stockings as told by your doctor. ? Raise (elevate) your feet for 15 minutes, 3-4 times a day. ? Limit salt in your food. Prenatal care  Write down your questions. Take them to your prenatal visits.  Keep all your prenatal visits as told by your doctor. This is important. Safety  Wear your seat belt when driving.  Make a list of emergency phone numbers, including numbers for family, friends, the hospital, and police and fire departments. General instructions  Ask your doctor about the right foods to eat or for help finding a counselor, if you need these services.  Ask your doctor about local prenatal classes. Begin classes before month 6 of your pregnancy.  Do not use hot tubs, steam rooms, or saunas.  Do not douche or use tampons or scented sanitary pads.  Do not cross your legs for long periods of time.  Visit your dentist if you have not done so. Use a soft toothbrush to brush your teeth. Floss gently.  Avoid all  smoking, herbs, and alcohol. Avoid drugs that are not approved by your doctor.  Do not use any products that contain nicotine or tobacco, such as cigarettes and e-cigarettes. If you need help quitting, ask your doctor.  Avoid cat litter boxes and soil used by cats. These carry germs that can cause birth defects in the baby and can cause a loss of your baby (miscarriage) or stillbirth. Contact a doctor if:  You have mild cramps or pressure in your lower belly.  You have pain when you pee (urinate).  You have bad smelling fluid coming from your vagina.  You continue to  feel sick to your stomach (nauseous), throw up (vomit), or have watery poop (diarrhea).  You have a nagging pain in your belly area.  You feel dizzy. Get help right away if:  You have a fever.  You are leaking fluid from your vagina.  You have spotting or bleeding from your vagina.  You have severe belly cramping or pain.  You lose or gain weight rapidly.  You have trouble catching your breath and have chest pain.  You notice sudden or extreme puffiness (swelling) of your face, hands, ankles, feet, or legs.  You have not felt the baby move in over an hour.  You have severe headaches that do not go away when you take medicine.  You have trouble seeing. Summary  The second trimester is from week 14 through week 27 (months 4 through 6). This is often the time in pregnancy that you feel your best.  To take care of yourself and your unborn baby, you will need to eat healthy meals, take medicines only if your doctor tells you to do so, and do activities that are safe for you and your baby.  Call your doctor if you get sick or if you notice anything unusual about your pregnancy. Also, call your doctor if you need help with the right food to eat, or if you want to know what activities are safe for you. This information is not intended to replace advice given to you by your health care provider. Make sure you discuss any questions you have with your health care provider. Document Revised: 07/03/2018 Document Reviewed: 04/16/2016 Elsevier Patient Education  2020 [place] [phone]) . [facility] Davy Pique, MD; Gwendlyn Deutscher, MD; Walker Kehr, MD; Andria Frames, MD; McDiarmid, MD; Dutch Quint, MD; Nori Riis, MD; Mingo Amber, [address] [place], [place] [address] o [phone] o Mon-Fri 8:30-12:30, 1:30-5:00 o Providers come to see babies at [facility] o Accepting Medicaid . [place]  at [place] providers who accept newborns: Dorthy Cooler, MD; Orland Mustard, MD; Stephanie Acre, MD o [place], [place], [place] [address] o [phone] o Mon-Fri 8:00-5:30 o Babies seen by providers at [facility] o Does NOT accept Medicaid o Please call early in hospitalization for appointment (limited availability)  . Mustard Phelps, MD o [address] [place], [place] [address] o [phone] o Mon, Tue, Thur, Fri 8:30-5:00, Wed 10:00-7:00 (closed 1-2pm) o Babies seen by [facility] providers o Accepting Medicaid . Chapman, MD o [place], [place], [place] [address] o [phone] o Mon-Fri 8:30-5:00, Sat 8:30-12:00 o Provider comes to see babies at [place] Medicaid o Must have been referred from current patients or contacted office prior to delivery . [facility] [facility]) Franne Forts, MD; Tamera Punt, MD; Doneen Poisson, MD; Fatima Sanger, MD; Wynetta Emery, MD; Jess Barters, MD; Tami Ribas, MD; Herbert Moors, MD; Derrell Lolling, MD; Dorothyann Peng, MD; Lucious Groves, NP; Baldo Ash, NP o [place], [place], [place] [address] o [phone] o Mon, Tue, Thur, Fri 8:30-5:30, Wed 9:30-5:30, Sat 8:30-12:30 o Babies seen by [facility] providers o Accepting Medicaid o Only accepting infants of first-time parents or siblings of current patients [facility] discharge coordinator will make follow-up appointment . Baltazar Najjar o [place] [address] [place], [place]  [address] o [phone]   Fax - [phone] . [facility] o [address], [place], [place]  [address] o Phone - [phone]   Fax [phone] . [place], [place], [place], [place]  [address] o [phone]  [place] [phone]) . [facility] Reginal Lutes, MD; Jacklynn Ganong, MD; Torrie Mayers, MD; MD; Rosana Hoes, MD; Servando Salina, MD;  Rose Fillers, MD; Rex Kras, MD; Corinna Capra, MD; Volney American, MD; Trilby Drummer, MD; Janann Colonel, MD; Jimmye Norman, [place], [place], [place] [address] o [phone] o Mon-Fri 8:30-5:00 (extended evenings Mon-Thur as needed), Sat-Sun 10:00-1:00 o Providers come to see babies at [place] Medicaid for families of first-time babies and families with all children in the household age 53 and under. Must register with office prior to making appointment (M-F only). . [place], NP; Tomi Bamberger, MD; Redmond School, MD; [place], [address] [place], [place] [address] o [phone] o Mon-Fri 8:00-5:00 o Babies seen by providers at [facility] o Does NOT accept Medicaid/Commercial Insurance Only . [facility] [place] ([facility])  Marnee Guarneri, MD; Drema Dallas, MD; Montine Circle, MD; Vilma Prader, MD; Vanita Panda, MD; Alfonso Ramus, MD; Ruthann Cancer, MD; Roxanne Mins, MD; Rosalva Ferron, MD; Polly Cobia, MD o [address] [place], [place] [address] o [phone] o Mon-Fri 8:30-5:30, Sat (Oct.-Mar.) 9:00-1:00 o Babies seen by providers at [place] [phone]) . [facility] Peers, MD; Suzan Slick, MD o [place] [address], [place], [place] [address] o [phone] o Mon-Fri 8:30-5:00, Sat 8:30-12:00 o Providers come to see babies at [facility] o Does NOT accept Medicaid . [place] at [place], [place]; [place], MD; [place], [place]; Nancy Fetter, MD; Moreen Fowler, [place], [place], [place] [address] o [phone] o Mon-Fri 8:00-5:00 o Babies seen by providers at [facility] o Does NOT accept Medicaid o Only accepting babies of parents who are patients o Please call early in hospitalization for appointment (limited availability) . [facility] Pediatricians Blanca Friend, MD; Sharlene Motts, MD; Rod Can, MD; Warner Mccreedy, NP; Sabra Heck, MD; Ermalinda Memos, MD; Sharlett Iles, NP; Aurther Loft, MD; Jerrye Beavers, MD; Marcello Moores, MD;  Berline Lopes, MD; Charolette Forward, MD o [place]. [place], [place], [place] [address] o [phone] o Mon-Fri 8:00-5:00, Sat 9:00-12:00 o Providers come to see babies at [facility] o Does NOT accept [facility] [phone]) . [place] at [place] providers accepting new patients: Dayna Ramus, NP; [place], [place], [place], [place] [address] o [phone] o Mon-Fri 8:00-5:00 o Babies seen by providers at [facility] o Does NOT accept Medicaid o Only accepting babies of parents who are patients o Please call early in hospitalization for appointment (limited availability) . [facility] Oswaldo Conroy, MD; Sheran Lawless, MD o [place]., [place],  [address] o [phone] (press 1 to schedule appointment) o Mon-Fri 8:00-5:00 o Providers  come to see babies at [facility] o Does NOT accept Medicaid . [facility] Jodi Mourning, MD o [address] [place], [place] [address] o [phone] o Mon-Fri 8:30-5:00 (lunch 12:30-1:00), extended hours by appointment only Wed 5:00-6:30 o Babies seen by [facility] providers o Accepting Medicaid . [place] at Evalyn Casco, MD; [place], MD; Ethlyn Gallery, MD o [place], [place], [place] [address] o [phone] o Mon-Fri 8:00-5:00 o Babies seen by [facility] providers o Does NOT accept Medicaid . Therapist, music at [place], MD; Yong Channel, MD; [place], [address] [place], [place] [address] o [phone] o Mon-Fri 8:00-5:00 o Babies seen by [facility] providers o Does NOT accept Medicaid . [place], [place]; [place], [place]; [place], NP; Albertina Parr, MD; Frederic Jericho, MD; Ronney Lion, MD; Carlos Levering, NP; Jerelene Redden, NP; Tomasita Crumble, NP; Ronelle Nigh, NP; Corinna Lines, MD; [place], MD o [address] [place], [place] [address] o [phone] o Mon-Fri 8:30-5:00, Sat 10:00-1:00 o Providers come to see babies at [facility] o Does NOT accept Medicaid o Free prenatal information session Tuesdays at 4:45pm . [facility] Porfirio Oar, MD; [place], [place]; [place], [place]; [place], [place]., [place] [address] o [phone] o Mon-Fri 7:30-5:30 o Babies seen by [facility] providers . [facility] Children's Doctor o [address] [place], [place], [place]  [address] o [phone]   Fax - [phone]  [place] [phone] & [phone]) . [place], MD o [address] [place], [place] [address] o [phone] o Mon-Thur 8:00-6:00 o Providers come to see babies at [place] Medicaid . [place], NP; Melford Aase, MD; [place], [place]; [place], [address]., [place], [place] [address] o [phone] o Mon-Thur 7:30-7:30, Fri 7:30-4:30 o Babies seen by [facility] providers o Accepting Medicaid . [facility] Nyra Jabs, MD; Cristino Martes, NP; Gertie Baron, MD o [place] [address], [place], [place] [address] o [phone] o Mon-Fri 8:30-5:00, Sat 8:30-12:00 o Providers come to see babies at [place] Medicaid o Must have "Meet & Greet" appointment at office prior to delivery . [place] ([place]) Jodene Nam, MD; Juleen China, MD; Clydene Laming, [place] [address], [place], [place] [address] o [phone] o Mon-Wed 8:00-6:00, Thur-Fri 8:00-5:00, Sat 9:00-12:00 o Providers come to see babies at [facility] o Does NOT accept Medicaid o Only accepting siblings of current patients . [facility] [place], [place], [place], [place]  [address] o [phone]   Fax [phone] . [place] at [place] [address] [place], [place]  [address] o [phone]   Fax - [place] Plato [phone] & [phone]) . Forensic psychologist at [place], DO; [place], [place]., [place], [place] [address] o [phone] o Mon-Fri 7:00-5:00 o Babies seen by [facility] providers o Does NOT accept Medicaid . , MD; [place], [place]; [place], [place], [place], [place] [address] o [phone] o Mon-Fri 8:00-5:00 o Babies seen by [facility] providers o Accepting Medicaid . [place], MD; [place], [place]; [place], NP; [place], [place], [place],  [address] o [phone] o Mon-Fri 8:00-5:00 o Babies seen by providers at [facility] o Accepting Medicaid  [facility] [phone]) . [facility] [place], [place] o [address] [place], [place] [address] o [phone] o Mon-Fri 8:00-5:00 o Babies seen by [facility] providers o Does NOT accept Medicaid o Limited availability, please call early in hospitalization to schedule follow-up . [facility] Leilani Merl, PA; Maisie Fus, MD; [place], MD; Ceex Haci, [place]; Jeannine Kitten, MD; [place], [place] [facility] [address], [place], [place] [address] o [phone] o Mon-Fri 8:30-5:00, Sat 9:00-12:00 o Babies seen by providers at [facility] o Accepting Medicaid o Please register online then schedule online or call office o [email or web address] . [place] ([place] at [place]) Kristian Covey, NP; Dwyane Dee, MD; Leonidas Romberg, PA o [address] [place], [place], [place] [address] o [phone] o Mon-Fri 8:00-5:00 o Babies seen by providers at [facility] o Accepting Medicaid . [place] ([facility] [employer]) Dairl Ponder, MD; Rayvon Char, NP; Melina Modena, MD o [address] [place], [place], [place] [address] o [phone] o Mon-Fri 8:00-5:30, Sat&Sun by  appointment (phones open at 8:30) o Babies seen by [facility] providers o Accepting Medicaid o Must be a first-time baby or sibling of current patient . [place], [address], [place], [place]  [address] o [phone]   Fax - [phone]  [place] [phone] & [phone]) . [place], [place]; [place], [place]; Benjamine Mola, MD; [place], [place]; Harrell Lark, MD o [address] [place], [place] [address] o [phone] o Mon-Thur 8:00-7:00, Fri 8:00-5:00, Sat 8:00-12:00, Sun 9:00-12:00 o Babies seen by [facility] providers o Accepting Medicaid . [facility] at [facility], MD; Ruthann Cancer, MD; [facility], MD o [address], [place], [place] [address] o [phone] o Mon-Thur 8:00-5:00 o Babies seen by providers at [facility] o Accepting Medicaid . [facility] at [place], MD; Co[place], MD; Amedeo Plenty, MD; Bobby Rumpf, MD; [place], MD; Lavonia Drafts, MD; Ruthann Cancer, MD; Selinda Eon, MD; Audie Box, MD; Jim Like, MD; Christie Nottingham, MD; Hubbard Hartshorn, MD; Modena Nunnery, MD o [address] [place], [place] [address] o [phone] o Mon-Fri 8:00-5:30, Sat (Oct.-Mar.) 9:00-1:00 o Babies seen by providers at [facility] o Accepting Medicaid o Must fill out new patient packet, available online at [email or web address] . [place] ([facility]) Barnabas Lister, NP; Kenton Kingfisher, NP; Claiborne Billings, NP; Rolla Plate, MD; [place], [place]; Carola Rhine, MD; Tyron Russell, MD; Delia Chimes, NP o [address], [place], [place] [address] o [phone] o Mon-Thur 8:00-5:30, Fri 8:00-5:00 o Babies seen by providers at [place] [phone]) . [place], [place]; [place], MD; Dennard Schaumann, MD; [place], [place] o [address] [place], [place] [address] o [phone] o Mon-Fri 8:00-5:00 o Babies seen  by providers at [place] [phone]) . [place] at [place], [place]; Olen Pel, MD; [place], [place], [place], [place] [address] o [phone] o Mon-Fri 8:00-5:00 o Babies seen by providers at [facility] o Does NOT accept Medicaid o Limited appointment availability, please call early in hospitalization  . Therapist, music at [place], [place]; [place], [address] [place],  [address] o [phone] o Mon-Fri 8:00-5:00 o Babies seen by [facility] providers o Does NOT accept Medicaid . [facility] Su Grand, MD; Guy Sandifer, MD; [place], [place]; [place], [place] [address], [place], [place] [address] o [phone] o Mon-Fri 8:00-5:00 o After hours clinic [facility] [place], [place] [address]) [phone] Mon-Fri 5:00-8:00, Sat 12:00-6:00, Sun 10:00-4:00 o Babies seen by [facility] providers o Accepting Medicaid . [place] at [facility] o [address] [place], [place]  [address] o [phone]   Fax - [phone]  [place] [phone]) . Therapist, music at [facility], MD o [address] [place], [place], [place] [address] o [phone] o Mon-Fri 8:00-5:00 o Babies seen by [facility] providers o Does NOT accept Medicaid . [place] ([place] [facility] [place]) Bing Neighbors, MD o [address] [place], [place], [place] [address] o [phone] o Mon-Thur 8:00-7:00, Fri 8:00-5:00, Sat 8:00-12:00 o Babies seen by providers at [facility] o Accepting Medicaid - but does not have vaccinations in office (must be received elsewhere) o Limited availability, please call early in hospitalization  [place] ([phone]) . [place], [place] [address], [place] [address] o [phone]  Fax [phone]

## 2019-12-05 NOTE — Progress Notes (Signed)
PRENATAL VISIT NOTE  Subjective:  Melissa Medina is a 49 y.o. [name] at [redacted]w[redacted]d being seen today for ongoing prenatal care.  She is currently monitored for the following issues for this high-risk pregnancy and has Supervision of high risk pregnancy, antepartum; AMA (advanced maternal age) multigravida 9+; Acute nonintractable headache; Generalized body aches; Bilateral groin pain; Pregnancy resulting from in vitro fertilization; Group B streptococcal bacteriuria; Obesity; Abnormal uterine bleeding; Anxiety; Bilateral fibrocystic breast changes; Cough; Depression; Pain syndrome, chronic; Fibromyalgia; Chronic low back pain; Gastroesophageal reflux disease; History of cervical dysplasia; History of reduction mammoplasty; Iron deficiency anemia due to chronic blood loss; Menometrorrhagia; Pyelonephritis; Restless legs syndrome; Sleep apnea; Uterine fibroid; Vitamin D deficiency; Migraine without aura and responsive to treatment; Posttraumatic stress disorder; and Round ligament pain on their problem list.  Patient reports heartburn and productive cough after eating, LUQ pain, intermittent constipation.  Contractions: Not present. Vag. Bleeding: None.  Movement: (!) Decreased. Denies leaking of fluid.   The following portions of the patient's history were reviewed and updated as appropriate: allergies, current medications, past family history, past medical history, past social history, past surgical history and problem list.   Objective:   Vitals:   12/03/19 1004  BP: 121/83  Pulse: (!) 101  Weight: 223 lb 1.6 oz (101.2 kg)    Fetal Status: Fetal Heart Rate (bpm): 151   Movement: (!) Decreased     General:  Alert, oriented and cooperative. Patient is in no acute distress.  Skin: Skin is warm and dry. No rash noted.   Cardiovascular: Normal heart rate noted  Respiratory: Normal respiratory effort, no problems with respiration noted  Abdomen: Soft, gravid, appropriate for gestational age.   Pain/Pressure: Present     Pelvic: Cervical exam deferred        Extremities: Normal range of motion.  Edema: None  Mental Status: Normal mood and affect. Normal behavior. Normal judgment and thought content.   Assessment and Plan:  Pregnancy: [name] at [redacted]w[redacted]d 1. Supervision of high risk pregnancy, antepartum - Discussed inconsistent FM is expected prior to 38 weeks, patient notes FM today  - Advised that constipation is common in pregnancy, Mirilax can be used PRN, increase PO hydration and fiber intake  2. Group B streptococcal bacteriuria - Treat in labor - +GBS on TOC urine culture from June, retreated - Urine culture sent again today  3. Multigravida of advanced maternal age in second trimester Advanced Maternal Age > 44 y.o. - O9.519 (primip), O09.529 (multip) 20-24-28-32-36 36 39-40  - Next [place] scheduled with MFM 9/27 - Taking BASA  4. Pregnancy resulting from in vitro fertilization in second trimester  5. Iron deficiency anemia due to chronic blood loss - Normal Hgb at last check, discussed plan to recheck at next visit with routine third trimester labs   6. Round ligament pain  7. Gastroesophageal reflux disease without esophagitis - Discussed diet for GERD - Advised to take previously prescribed pantoprazole daily as directed and let us know if that is still not helping, can increase to BID if needed   8. [redacted] weeks gestation of pregnancy  Preterm labor symptoms and general obstetric precautions including but not limited to vaginal bleeding, contractions, leaking of fluid and fetal movement were reviewed in detail with the patient. Please refer to After Visit Summary for other counseling recommendations.   Return in about 4 weeks (around 12/31/2019) for In-Person, 28 week labs (fasting), HOB MD only.  Future Appointments  Date Time Provider [place]  12/20/2019  7:45 AM [facility] NURSE [facility]  12/20/2019  8:00 AM [facility] US1 [facility]  12/31/2019  8:15  AM Chancy Milroy, MD [facility]  12/31/2019  9:30 AM [facility] LAB [facility]    Kerry Hough, PA-C

## 2019-12-07 ENCOUNTER — Encounter: Payer: Self-pay | Admitting: Medical

## 2019-12-07 ENCOUNTER — Encounter: Payer: Self-pay | Admitting: General Practice

## 2019-12-20 ENCOUNTER — Ambulatory Visit: Payer: No Typology Code available for payment source | Attending: Obstetrics and Gynecology

## 2019-12-20 ENCOUNTER — Encounter: Payer: Self-pay | Admitting: *Deleted

## 2019-12-20 ENCOUNTER — Ambulatory Visit: Payer: No Typology Code available for payment source | Admitting: *Deleted

## 2019-12-20 ENCOUNTER — Other Ambulatory Visit: Payer: Self-pay

## 2019-12-20 ENCOUNTER — Other Ambulatory Visit: Payer: Self-pay | Admitting: *Deleted

## 2019-12-20 DIAGNOSIS — O09523 Supervision of elderly multigravida, third trimester: Secondary | ICD-10-CM

## 2019-12-20 DIAGNOSIS — Z362 Encounter for other antenatal screening follow-up: Secondary | ICD-10-CM

## 2019-12-20 DIAGNOSIS — O09529 Supervision of elderly multigravida, unspecified trimester: Secondary | ICD-10-CM | POA: Insufficient documentation

## 2019-12-20 DIAGNOSIS — O0990 Supervision of high risk pregnancy, unspecified, unspecified trimester: Secondary | ICD-10-CM | POA: Insufficient documentation

## 2019-12-20 DIAGNOSIS — O321XX0 Maternal care for breech presentation, not applicable or unspecified: Secondary | ICD-10-CM

## 2019-12-20 DIAGNOSIS — O99213 Obesity complicating pregnancy, third trimester: Secondary | ICD-10-CM | POA: Diagnosis not present

## 2019-12-20 DIAGNOSIS — Z3A28 28 weeks gestation of pregnancy: Secondary | ICD-10-CM

## 2019-12-20 DIAGNOSIS — O09813 Supervision of pregnancy resulting from assisted reproductive technology, third trimester: Secondary | ICD-10-CM | POA: Diagnosis not present

## 2019-12-31 ENCOUNTER — Other Ambulatory Visit: Payer: No Typology Code available for payment source

## 2019-12-31 ENCOUNTER — Other Ambulatory Visit: Payer: Self-pay | Admitting: General Practice

## 2019-12-31 ENCOUNTER — Encounter: Payer: No Typology Code available for payment source | Admitting: Obstetrics and Gynecology

## 2019-12-31 DIAGNOSIS — O0990 Supervision of high risk pregnancy, unspecified, unspecified trimester: Secondary | ICD-10-CM

## 2020-01-01 ENCOUNTER — Encounter: Payer: Self-pay | Admitting: Medical

## 2020-01-04 ENCOUNTER — Other Ambulatory Visit: Payer: Self-pay

## 2020-01-04 ENCOUNTER — Other Ambulatory Visit: Payer: No Typology Code available for payment source

## 2020-01-04 ENCOUNTER — Encounter: Payer: Self-pay | Admitting: Family Medicine

## 2020-01-04 ENCOUNTER — Ambulatory Visit (INDEPENDENT_AMBULATORY_CARE_PROVIDER_SITE_OTHER): Payer: No Typology Code available for payment source | Admitting: Family Medicine

## 2020-01-04 VITALS — BP 124/91 | HR 97 | Wt 228.2 lb

## 2020-01-04 DIAGNOSIS — Z23 Encounter for immunization: Secondary | ICD-10-CM

## 2020-01-04 DIAGNOSIS — O169 Unspecified maternal hypertension, unspecified trimester: Secondary | ICD-10-CM

## 2020-01-04 DIAGNOSIS — O0990 Supervision of high risk pregnancy, unspecified, unspecified trimester: Secondary | ICD-10-CM

## 2020-01-04 DIAGNOSIS — O09523 Supervision of elderly multigravida, third trimester: Secondary | ICD-10-CM

## 2020-01-04 DIAGNOSIS — O09812 Supervision of pregnancy resulting from assisted reproductive technology, second trimester: Secondary | ICD-10-CM

## 2020-01-04 DIAGNOSIS — R8271 Bacteriuria: Secondary | ICD-10-CM

## 2020-01-04 NOTE — Addendum Note (Signed)
Addended by: Louisa Second E on: 01/04/2020 12:16 PM   Modules accepted: Orders

## 2020-01-04 NOTE — Progress Notes (Signed)
   PRENATAL VISIT NOTE  Subjective:  Melissa Medina is a 49 y.o. [name] at [redacted]w[redacted]d being seen today for ongoing prenatal care.  She is currently monitored for the following issues for this high-risk pregnancy and has Supervision of high risk pregnancy, antepartum; AMA (advanced maternal age) multigravida 76+; Acute nonintractable headache; Generalized body aches; Bilateral groin pain; Pregnancy resulting from in vitro fertilization; Group B streptococcal bacteriuria; Obesity; Abnormal uterine bleeding; Anxiety; Bilateral fibrocystic breast changes; Cough; Depression; Pain syndrome, chronic; Fibromyalgia; Chronic low back pain; Gastroesophageal reflux disease; History of cervical dysplasia; History of reduction mammoplasty; Iron deficiency anemia due to chronic blood loss; Menometrorrhagia; Pyelonephritis; Restless legs syndrome; Sleep apnea; Uterine fibroid; Vitamin D deficiency; Migraine without aura and responsive to treatment; and Posttraumatic stress disorder on their problem list.  Patient reports fatigue, headache and abdominal pain, pelvic pressure.  Contractions: Not present. Vag. Bleeding: None.  Movement: Present. Denies leaking of fluid.   The following portions of the patient's history were reviewed and updated as appropriate: allergies, current medications, past family history, past medical history, past social history, past surgical history and problem list.   Objective:   Vitals:   01/04/20 0824  BP: (!) 124/91  Pulse: 97  Weight: 228 lb 3.2 oz (103.5 kg)    Fetal Status: Fetal Heart Rate (bpm): 154   Movement: Present     General:  Alert, oriented and cooperative. Patient is in no acute distress.  Skin: Skin is warm and dry. No rash noted.   Cardiovascular: Normal heart rate noted  Respiratory: Normal respiratory effort, no problems with respiration noted  Abdomen: Soft, gravid, appropriate for gestational age.  Pain/Pressure: Present     Pelvic: Cervical exam deferred         Extremities: Normal range of motion.  Edema: Trace  Mental Status: Normal mood and affect. Normal behavior. Normal judgment and thought content.   Assessment and Plan:  Pregnancy: [name] at [redacted]w[redacted]d 1. Group B streptococcal bacteriuria TOC today - Culture, OB Urine  2. Multigravida of advanced maternal age in third trimester Normal NIPT and preimplanatation genetics On ASA Weekly BPPs with MFM at 34 wks Normal growth 3 wks ago  3. Supervision of high risk pregnancy, antepartum 28 wk labs and TDaP today  4. Pregnancy resulting from in vitro fertilization in third trimester   5. Elevated blood pressure affecting pregnancy, antepartum Mildly elevated DBP but with symptoms, age, will check labs BP cuff at home to monitor - Comprehensive metabolic panel - Protein / creatinine ratio, urine  Preterm labor symptoms and general obstetric precautions including but not limited to vaginal bleeding, contractions, leaking of fluid and fetal movement were reviewed in detail with the patient. Please refer to After Visit Summary for other counseling recommendations.   Return in 2 weeks (on 01/18/2020) for [facility], needs MD, in person.  Future Appointments  Date Time Provider [place]  01/17/2020 12:30 PM [facility] NURSE [facility]  01/17/2020 12:45 PM [facility]  01/20/2020 10:15 AM Guss Bunde, MD [facility]  01/31/2020 12:30 PM [facility] NURSE [facility]  01/31/2020 12:45 PM [facility]    Donnamae Jude, MD

## 2020-01-04 NOTE — Patient Instructions (Signed)

## 2020-01-05 ENCOUNTER — Encounter: Payer: Self-pay | Admitting: Medical

## 2020-01-05 ENCOUNTER — Encounter: Payer: Self-pay | Admitting: Family Medicine

## 2020-01-05 ENCOUNTER — Telehealth: Payer: Self-pay | Admitting: *Deleted

## 2020-01-05 DIAGNOSIS — O24419 Gestational diabetes mellitus in pregnancy, unspecified control: Secondary | ICD-10-CM | POA: Insufficient documentation

## 2020-01-05 DIAGNOSIS — Z8632 Personal history of gestational diabetes: Secondary | ICD-10-CM | POA: Insufficient documentation

## 2020-01-05 LAB — CBC
Hematocrit: 35 % (ref 34.0–46.6)
Hemoglobin: 11.5 g/dL (ref 11.1–15.9)
MCH: 28.8 pg (ref 26.6–33.0)
MCHC: 32.9 g/dL (ref 31.5–35.7)
MCV: 88 fL (ref 79–97)
Platelets: 292 10*3/uL (ref 150–450)
RBC: 3.99 x10E6/uL (ref 3.77–5.28)
RDW: 13.6 % (ref 11.7–15.4)
WBC: 7.6 10*3/uL (ref 3.4–10.8)

## 2020-01-05 LAB — COMPREHENSIVE METABOLIC PANEL
ALT: 25 IU/L (ref 0–32)
AST: 20 IU/L (ref 0–40)
Albumin/Globulin Ratio: 1.5 (ref 1.2–2.2)
Albumin: 3.5 g/dL — ABNORMAL LOW (ref 3.8–4.9)
Alkaline Phosphatase: 163 IU/L — ABNORMAL HIGH (ref 44–121)
BUN/Creatinine Ratio: 7 — ABNORMAL LOW (ref 9–23)
BUN: 4 mg/dL — ABNORMAL LOW (ref 6–24)
Bilirubin Total: 0.4 mg/dL (ref 0.0–1.2)
CO2: 19 mmol/L — ABNORMAL LOW (ref 20–29)
Calcium: 8.8 mg/dL (ref 8.7–10.2)
Chloride: 103 mmol/L (ref 96–106)
Creatinine, Ser: 0.58 mg/dL (ref 0.57–1.00)
GFR calc Af Amer: 123 mL/min/{1.73_m2} (ref >59)
GFR calc non Af Amer: 107 mL/min/{1.73_m2} (ref >59)
Globulin, Total: 2.4 g/dL (ref 1.5–4.5)
Glucose: 185 mg/dL — ABNORMAL HIGH (ref 65–99)
Potassium: 3.8 mmol/L (ref 3.5–5.2)
Sodium: 135 mmol/L (ref 134–144)
Total Protein: 5.9 g/dL — ABNORMAL LOW (ref 6.0–8.5)

## 2020-01-05 LAB — HIV ANTIBODY (ROUTINE TESTING W REFLEX): HIV Screen 4th Generation wRfx: NONREACTIVE

## 2020-01-05 LAB — GLUCOSE TOLERANCE, 2 HOURS W/ 1HR
Glucose, 1 hour: 177 mg/dL (ref 65–179)
Glucose, 2 hour: 173 mg/dL — ABNORMAL HIGH (ref 65–152)
Glucose, Fasting: 87 mg/dL (ref 65–91)

## 2020-01-05 LAB — PROTEIN / CREATININE RATIO, URINE
Creatinine, Urine: 187.1 mg/dL
Protein, Ur: 17 mg/dL
Protein/Creat Ratio: 91 mg/g creat (ref 0–200)

## 2020-01-05 LAB — RPR: RPR Ser Ql: NONREACTIVE

## 2020-01-05 NOTE — Telephone Encounter (Addendum)
-----   Message from Donnamae Jude, MD sent at 01/05/2020  1:01 PM EDT ----- Needs to see D and N Mgmt asap  10/13  1320  Called pt and informed her of test result indicating presence of Gestational Diabetes. Pt was advised to watch her MyChart account for an appointment which will be scheduled to meet with a Diabetes Educator. Pt voiced understanding of information provided.

## 2020-01-06 ENCOUNTER — Encounter: Payer: Self-pay | Admitting: Medical

## 2020-01-06 LAB — CULTURE, OB URINE

## 2020-01-06 LAB — URINE CULTURE, OB REFLEX

## 2020-01-14 ENCOUNTER — Inpatient Hospital Stay (HOSPITAL_BASED_OUTPATIENT_CLINIC_OR_DEPARTMENT_OTHER): Payer: No Typology Code available for payment source

## 2020-01-14 ENCOUNTER — Encounter (HOSPITAL_COMMUNITY): Payer: Self-pay | Admitting: Obstetrics & Gynecology

## 2020-01-14 ENCOUNTER — Inpatient Hospital Stay (HOSPITAL_COMMUNITY)
Admission: AD | Admit: 2020-01-14 | Discharge: 2020-01-14 | Disposition: A | Discharge status: Home / Self Care | Payer: No Typology Code available for payment source | Attending: Family Medicine | Admitting: Family Medicine

## 2020-01-14 ENCOUNTER — Telehealth: Payer: Self-pay | Admitting: *Deleted

## 2020-01-14 ENCOUNTER — Other Ambulatory Visit: Payer: Self-pay

## 2020-01-14 DIAGNOSIS — R519 Headache, unspecified: Secondary | ICD-10-CM | POA: Diagnosis not present

## 2020-01-14 DIAGNOSIS — O133 Gestational [pregnancy-induced] hypertension without significant proteinuria, third trimester: Secondary | ICD-10-CM

## 2020-01-14 DIAGNOSIS — O26893 Other specified pregnancy related conditions, third trimester: Secondary | ICD-10-CM | POA: Insufficient documentation

## 2020-01-14 DIAGNOSIS — O1213 Gestational proteinuria, third trimester: Secondary | ICD-10-CM | POA: Insufficient documentation

## 2020-01-14 DIAGNOSIS — O1413 Severe pre-eclampsia, third trimester: Secondary | ICD-10-CM

## 2020-01-14 DIAGNOSIS — O368130 Decreased fetal movements, third trimester, not applicable or unspecified: Secondary | ICD-10-CM | POA: Diagnosis not present

## 2020-01-14 DIAGNOSIS — O139 Gestational [pregnancy-induced] hypertension without significant proteinuria, unspecified trimester: Secondary | ICD-10-CM

## 2020-01-14 DIAGNOSIS — M797 Fibromyalgia: Secondary | ICD-10-CM | POA: Insufficient documentation

## 2020-01-14 DIAGNOSIS — Z7982 Long term (current) use of aspirin: Secondary | ICD-10-CM | POA: Insufficient documentation

## 2020-01-14 DIAGNOSIS — O3413 Maternal care for benign tumor of corpus uteri, third trimester: Secondary | ICD-10-CM

## 2020-01-14 DIAGNOSIS — Z3A31 31 weeks gestation of pregnancy: Secondary | ICD-10-CM | POA: Insufficient documentation

## 2020-01-14 DIAGNOSIS — Z79899 Other long term (current) drug therapy: Secondary | ICD-10-CM | POA: Diagnosis not present

## 2020-01-14 DIAGNOSIS — R0602 Shortness of breath: Secondary | ICD-10-CM | POA: Diagnosis not present

## 2020-01-14 DIAGNOSIS — O09523 Supervision of elderly multigravida, third trimester: Secondary | ICD-10-CM

## 2020-01-14 DIAGNOSIS — D259 Leiomyoma of uterus, unspecified: Secondary | ICD-10-CM | POA: Diagnosis not present

## 2020-01-14 DIAGNOSIS — O99213 Obesity complicating pregnancy, third trimester: Secondary | ICD-10-CM | POA: Insufficient documentation

## 2020-01-14 DIAGNOSIS — E669 Obesity, unspecified: Secondary | ICD-10-CM

## 2020-01-14 DIAGNOSIS — O09813 Supervision of pregnancy resulting from assisted reproductive technology, third trimester: Secondary | ICD-10-CM

## 2020-01-14 DIAGNOSIS — O24419 Gestational diabetes mellitus in pregnancy, unspecified control: Secondary | ICD-10-CM | POA: Insufficient documentation

## 2020-01-14 DIAGNOSIS — Z3689 Encounter for other specified antenatal screening: Secondary | ICD-10-CM

## 2020-01-14 HISTORY — DX: Gestational diabetes mellitus in pregnancy, unspecified control: O24.419

## 2020-01-14 LAB — URINALYSIS, ROUTINE W REFLEX MICROSCOPIC
Bilirubin Urine: NEGATIVE
Glucose, UA: NEGATIVE mg/dL
Hgb urine dipstick: NEGATIVE
Ketones, ur: NEGATIVE mg/dL
Leukocytes,Ua: NEGATIVE
Nitrite: NEGATIVE
Protein, ur: NEGATIVE mg/dL
Specific Gravity, Urine: 1.011 (ref 1.005–1.030)
pH: 6 (ref 5.0–8.0)

## 2020-01-14 LAB — COMPREHENSIVE METABOLIC PANEL
ALT: 27 U/L (ref 0–44)
AST: 24 U/L (ref 15–41)
Albumin: 2.5 g/dL — ABNORMAL LOW (ref 3.5–5.0)
Alkaline Phosphatase: 129 U/L — ABNORMAL HIGH (ref 38–126)
Anion gap: 8 (ref 5–15)
BUN: 5 mg/dL — ABNORMAL LOW (ref 6–20)
CO2: 22 mmol/L (ref 22–32)
Calcium: 8.9 mg/dL (ref 8.9–10.3)
Chloride: 105 mmol/L (ref 98–111)
Creatinine, Ser: 0.71 mg/dL (ref 0.44–1.00)
GFR, Estimated: >60 mL/min (ref >60)
Glucose, Bld: 114 mg/dL — ABNORMAL HIGH (ref 70–99)
Potassium: 3.6 mmol/L (ref 3.5–5.1)
Sodium: 135 mmol/L (ref 135–145)
Total Bilirubin: 0.7 mg/dL (ref 0.3–1.2)
Total Protein: 6.2 g/dL — ABNORMAL LOW (ref 6.5–8.1)

## 2020-01-14 LAB — PROTEIN / CREATININE RATIO, URINE
Creatinine, Urine: 116.79 mg/dL
Protein Creatinine Ratio: 0.06 mg/mg{Cre} (ref 0.00–0.15)
Total Protein, Urine: 7 mg/dL

## 2020-01-14 LAB — CBC
HCT: 34.1 % — ABNORMAL LOW (ref 36.0–46.0)
Hemoglobin: 11.3 g/dL — ABNORMAL LOW (ref 12.0–15.0)
MCH: 28.5 pg (ref 26.0–34.0)
MCHC: 33.1 g/dL (ref 30.0–36.0)
MCV: 86.1 fL (ref 80.0–100.0)
Platelets: 267 10*3/uL (ref 150–400)
RBC: 3.96 MIL/uL (ref 3.87–5.11)
RDW: 14.4 % (ref 11.5–15.5)
WBC: 8.1 10*3/uL (ref 4.0–10.5)
nRBC: 0 % (ref 0.0–0.2)

## 2020-01-14 MED ORDER — FREESTYLE SYSTEM KIT: 1.0000 | PACK | 0 refills | Status: DC | PRN

## 2020-01-14 MED ORDER — FREESTYLE LANCETS MISC: 1 refills | Status: DC

## 2020-01-14 MED ORDER — ACETAMINOPHEN 500 MG PO TABS
1000.0000 mg | ORAL_TABLET | Freq: Once | ORAL | Status: AC
  Administered 2020-01-14: 1000 mg via ORAL
  Filled 2020-01-14: qty 2

## 2020-01-14 MED ORDER — FREESTYLE TEST VI STRP: 1.0000 | ORAL_STRIP | Freq: Four times a day (QID) | 1 refills | Status: DC

## 2020-01-14 NOTE — [facility] Note (Signed)
For the past wk, baby has not been moving as much.  Been in contact with provider, doing as instructed, still not moving as much, so was told to come in. Denies pain, bleeding or LOF.  Recent dx with GDM. Has a 'light headache", has not taken anything for it.Marland Kitchen

## 2020-01-14 NOTE — Discharge Instructions (Signed)
Check your blood sugar 4 times per day: when you first wake up in the morning before eating or drinking, and 2 hours after breakfast/lunch/and dinner. Write down your values to show to the diabetes educator when you see her next week.     Gestational Diabetes Mellitus, Self Care Caring for yourself after you have been diagnosed with gestational diabetes (gestational diabetes mellitus) means keeping your blood sugar (glucose) under control. You can do that with a balance of:  Nutrition.  Exercise.  Lifestyle changes.  Medicines or insulin, if necessary.  Support from your team of health care providers and others. The following information explains what you need to know to manage your gestational diabetes at home. What are the risks? If gestational diabetes is treated, it is unlikely to cause problems. If it is not controlled with treatment, it may cause problems during labor and delivery, and some of those problems can be harmful to the unborn baby (fetus) and the mother. Uncontrolled gestational diabetes may also cause the newborn baby to have breathing problems and low blood glucose. Women who get gestational diabetes are more likely to develop it if they get pregnant again, and they are more likely to develop type 2 diabetes in the future. How to monitor blood glucose   Check your blood glucose every day during your pregnancy. Do this as often as told by your health care provider.  Contact your health care provider if your blood glucose is above your target for two tests in a row. Your health care provider will set individualized treatment goals for you. Generally, the goal of treatment is to maintain the following blood glucose levels during pregnancy:  Before meals (preprandial): at or below 95 mg/dL (5.3 mmol/L).  After meals (postprandial): ? One hour after a meal: at or below 140 mg/dL (7.8 mmol/L). ? Two hours after a meal: at or below 120 mg/dL (6.7 mmol/L).  A1c  (hemoglobin A1c) level: 6-6.5%. How to manage hyperglycemia and hypoglycemia Hyperglycemia symptoms Hyperglycemia, also called high blood glucose, occurs when blood glucose is too high. Make sure you know the early signs of hyperglycemia, such as:  Increased thirst.  Hunger.  Feeling very tired.  Needing to urinate more often than usual.  Blurry vision. Hypoglycemia symptoms Hypoglycemia, also called low blood glucose, occurs with a blood glucose level at or below 70 mg/dL (3.9 mmol/L). The risk for hypoglycemia increases during or after exercise, during sleep, during illness, and when skipping meals or not eating for a long time (fasting). Symptoms may include:  Hunger.  Anxiety.  Sweating and feeling clammy.  Confusion.  Dizziness or feeling light-headed.  Sleepiness.  Nausea.  Increased heart rate.  Headache.  Blurry vision.  Irritability.  Tingling or numbness around the mouth, lips, or tongue.  A change in coordination.  Restless sleep.  Fainting.  Seizure. It is important to know the symptoms of hypoglycemia and treat it right away. Always have a 15-gram rapid-acting carbohydrate snack with you to treat low blood glucose. Family members and close friends should also know the symptoms and should understand how to treat hypoglycemia, in case you are not able to treat yourself. Treating hypoglycemia If you are alert and able to swallow safely, follow the 15:15 rule:  Take 15 grams of a rapid-acting carbohydrate. Talk with your health care provider about how much you should take.  Rapid-acting options include: ? Glucose pills (take 15 grams). ? 6-8 pieces of hard candy. ? 4-6 oz (120-150 mL) of fruit  juice. ? 4-6 oz (120-150 mL) of regular (not diet) soda. ? 1 Tbsp (15 mL) honey or sugar.  Check your blood glucose 15 minutes after you take the carbohydrate.  If the repeat blood glucose level is still at or below 70 mg/dL (3.9 mmol/L), take 15 grams of  a carbohydrate again.  If your blood glucose level does not increase above 70 mg/dL (3.9 mmol/L) after 3 tries, seek emergency medical care.  After your blood glucose level returns to normal, eat a meal or a snack within 1 hour. Treating severe hypoglycemia Severe hypoglycemia is when your blood glucose level is at or below 54 mg/dL (3 mmol/L). Severe hypoglycemia is an emergency. Do not wait to see if the symptoms will go away. Get medical help right away. Call your local emergency services (911 in the [place] If you have severe hypoglycemia and you cannot eat or drink, you may need an injection of glucagon. A family member or close friend should learn how to check your blood glucose and how to give you a glucagon injection. Ask your health care provider if you need to have an emergency glucagon injection kit available. Severe hypoglycemia may need to be treated in a hospital. The treatment may include getting glucose through an IV. You may also need treatment for the cause of your hypoglycemia. Follow these instructions at home: Take diabetes medicines as told  If your health care provider prescribed insulin or diabetes medicines, take them every day.  Do not run out of insulin or other diabetes medicines that you take. Plan ahead so you always have these available.  If you use insulin, adjust your dosage based on how physically active you are and what foods you eat. Your health care provider will tell you how to adjust your dosage. Make healthy food choices  The things that you eat and drink affect your blood glucose (and your insulin dose, if this applies). Making good choices helps to control your diabetes and prevent other health problems. A healthy meal plan includes eating lean proteins, complex carbohydrates, fresh fruits and vegetables, low-fat dairy products, and healthy fats. Make an appointment to see a diet and nutrition specialist (registered dietitian) to help you create an eating  plan that is right for you. Make sure that you:  Follow instructions from your health care provider about eating or drinking restrictions.  Drink enough fluid to keep your urine pale yellow.  Eat healthy snacks between nutritious meals.  Keep a record of the carbohydrates that you eat. Do this by reading food labels and learning the standard serving sizes of foods.  Follow your sick day plan whenever you cannot eat or drink as usual. Make this plan in advance with your health care provider.  Stay active  Do 30 or more minutes of physical activity a day, or as much physical activity as your health care provider recommends during your pregnancy. ? Doing 10 minutes of exercise starting 30 minutes after each meal may help to control postprandial blood glucose levels.  If you start a new exercise or activity, work with your health care provider to adjust your insulin, medicines, or food intake as needed. Make healthy lifestyle choices  Do not drink alcohol.  Do not use any tobacco products, such as cigarettes, chewing tobacco, and e-cigarettes. If you need help quitting, ask your health care provider.  Learn to manage stress. If you need help with this, ask your health care provider. Care for your body  Keep your  immunizations up to date.  Brush your teeth and gums two times a day, and floss one or more times a day. Visit your dentist one or more times every 6 months.  Maintain a healthy weight during your pregnancy. General instructions  Take over-the-counter and prescription medicines only as told by your health care provider.  Talk with your health care provider about your risk for high blood pressure during pregnancy (preeclampsia or eclampsia).  Share your diabetes management plan with people in your workplace, school, and household.  Check your urine for ketones during your pregnancy when you are ill and as told by your health care provider.  Carry a medical alert card or  wear medical alert jewelry that says you have gestational diabetes.  Keep all follow-up visits during your pregnancy (prenatal) and after delivery (postnatal) as told by your health care provider. This is important. Get the care that you need after delivery  Have your blood glucose level checked 4-12 weeks after delivery. This is done with an oral glucose tolerance test (OGTT).  Get screened for diabetes at least every 3 years, or as often as told by your health care provider. Questions to ask your health care provider  Do I need to meet with a diabetes educator?  Where can I find a support group for people with gestational diabetes? Where to find more information For more information about gestational diabetes, visit:  American Diabetes Association (ADA): www.diabetes.org  Centers for Disease Control and Prevention (CDC): http://www.wolf.info/ Summary  Check your blood glucose every day during your pregnancy. Do this as often as told by your health care provider.  If your health care provider prescribed insulin or diabetes medicines, take them every day as told.  Keep all follow-up visits during your pregnancy (prenatal) and after delivery (postnatal) as told by your health care provider. This is important.  Have your blood glucose level checked 4-12 weeks after delivery. This information is not intended to replace advice given to you by your health care provider. Make sure you discuss any questions you have with your health care provider. Document Revised: 09/01/2017 Document Reviewed: 04/14/2015 Elsevier Patient Education  [place].     Hypertension During Pregnancy High blood pressure (hypertension) is when the force of blood pumping through the arteries is too strong. Arteries are blood vessels that carry blood from the heart throughout the body. Hypertension during pregnancy can be mild or severe. Severe hypertension during pregnancy (preeclampsia) is a medical emergency  that requires prompt evaluation and treatment. Different types of hypertension can happen during pregnancy. These include:  Chronic hypertension. This happens when you had high blood pressure before you became pregnant, and it continues during the pregnancy. Hypertension that develops before you are [redacted] weeks pregnant and continues during the pregnancy is also called chronic hypertension. If you have chronic hypertension, it will not go away after you have your baby. You will need follow-up visits with your health care provider after you have your baby. Your doctor may want you to keep taking medicine for your blood pressure.  Gestational hypertension. This is hypertension that develops after the 20th week of pregnancy. Gestational hypertension usually goes away after you have your baby, but your health care provider will need to monitor your blood pressure to make sure that it is getting better.  Preeclampsia. This is severe hypertension during pregnancy. This can cause serious complications for you and your baby and can also cause complications for you after the delivery of your  baby.  Postpartum preeclampsia. You may develop severe hypertension after giving birth. This usually occurs within 48 hours after childbirth but may occur up to 6 weeks after giving birth. This is rare. How does this affect me? Women who have hypertension during pregnancy have a greater chance of developing hypertension later in life or during future pregnancies. In some cases, hypertension during pregnancy can cause serious complications, such as:  Stroke.  Heart attack.  Injury to other organs, such as kidneys, lungs, or liver.  Preeclampsia.  Convulsions or seizures.  Placental abruption. How does this affect my baby? Hypertension during pregnancy can affect your baby. Your baby may:  Be born early (prematurely).  Not weigh as much as he or she should at birth (low birth weight).  Not tolerate labor well,  leading to an unplanned cesarean delivery. What are the risks? There are certain factors that make it more likely for you to develop hypertension during pregnancy. These include:  Having hypertension during a previous pregnancy.  Being overweight.  Being age 20 or older.  Being pregnant for the first time.  Being pregnant with more than one baby.  Becoming pregnant using fertilization methods, such as IVF (in vitro fertilization).  Having other medical problems, such as diabetes, kidney disease, or lupus.  Having a family history of hypertension. What can I do to lower my risk? The exact cause of hypertension during pregnancy is not known. You may be able to lower your risk by:  Maintaining a healthy weight.  Eating a healthy and balanced diet.  Following your health care provider's instructions about treating any long-term conditions that you had before becoming pregnant. It is very important to keep all of your prenatal care appointments. Your health care provider will check your blood pressure and make sure that your pregnancy is progressing as expected. If a problem is found, early treatment can prevent complications. How is this treated? Treatment for hypertension during pregnancy varies depending on the type of hypertension you have and how serious it is.  If you were taking medicine for high blood pressure before you became pregnant, talk with your health care provider. You may need to change medicine during pregnancy because some medicines, like ACE inhibitors, may not be considered safe for your baby.  If you have gestational hypertension, your health care provider may order medicine to treat this during pregnancy.  If you are at risk for preeclampsia, your health care provider may recommend that you take a low-dose aspirin during your pregnancy.  If you have severe hypertension, you may need to be hospitalized so you and your baby can be monitored closely. You may also  need to be given medicine to lower your blood pressure. This medicine may be given by mouth or through an IV.  In some cases, if your condition gets worse, you may need to deliver your baby early. Follow these instructions at home: Eating and drinking   Drink enough fluid to keep your urine pale yellow.  Avoid caffeine. Lifestyle  Do not use any products that contain nicotine or tobacco, such as cigarettes, e-cigarettes, and chewing tobacco. If you need help quitting, ask your health care provider.  Do not use alcohol or drugs.  Avoid stress as much as possible.  Rest and get plenty of sleep.  Regular exercise can help to reduce your blood pressure. Ask your health care provider what kinds of exercise are best for you. General instructions  Take over-the-counter and prescription medicines only as told  by your health care provider.  Keep all prenatal and follow-up visits as told by your health care provider. This is important. Contact a health care provider if:  You have symptoms that your health care provider told you may require more treatment or monitoring, such as: ? Headaches. ? Nausea or vomiting. ? Abdominal pain. ? Dizziness. ? Light-headedness. Get help right away if:  You have: ? Severe abdominal pain that does not get better with treatment. ? A severe headache that does not get better. ? Vomiting that does not get better. ? Sudden, rapid weight gain. ? Sudden swelling in your hands, ankles, or face. ? Vaginal bleeding. ? Blood in your urine. ? Blurred or double vision. ? Shortness of breath or chest pain. ? Weakness on one side of your body. ? Difficulty speaking.  Your baby is not moving as much as usual. Summary  High blood pressure (hypertension) is when the force of blood pumping through the arteries is too strong.  Hypertension during pregnancy can cause problems for you and your baby.  Treatment for hypertension during pregnancy varies depending  on the type of hypertension you have and how serious it is.  Keep all prenatal and follow-up visits as told by your health care provider. This is important. This information is not intended to replace advice given to you by your health care provider. Make sure you discuss any questions you have with your health care provider. Document Revised: 07/02/2018 Document Reviewed: 04/07/2018 Elsevier Patient Education  [place].    Fetal Movement Counts Patient Name: ________________________________________________ Patient Due Date: ____________________ What is a fetal movement count?  A fetal movement count is the number of times that you feel your baby move during a certain amount of time. This may also be called a fetal kick count. A fetal movement count is recommended for every pregnant woman. You may be asked to start counting fetal movements as early as week 28 of your pregnancy. Pay attention to when your baby is most active. You may notice your baby's sleep and wake cycles. You may also notice things that make your baby move more. You should do a fetal movement count:  When your baby is normally most active.  At the same time each day. A good time to count movements is while you are resting, after having something to eat and drink. How do I count fetal movements? 1. Find a quiet, comfortable area. Sit, or lie down on your side. 2. Write down the date, the start time and stop time, and the number of movements that you felt between those two times. Take this information with you to your health care visits. 3. Write down your start time when you feel the first movement. 4. Count kicks, flutters, swishes, rolls, and jabs. You should feel at least 10 movements. 5. You may stop counting after you have felt 10 movements, or if you have been counting for 2 hours. Write down the stop time. 6. If you do not feel 10 movements in 2 hours, contact your health care provider for further  instructions. Your health care provider may want to do additional tests to assess your baby's well-being. Contact a health care provider if:  You feel fewer than 10 movements in 2 hours.  Your baby is not moving like he or she usually does. Date: ____________ Start time: ____________ Stop time: ____________ Movements: ____________ Date: ____________ Start time: ____________ Stop time: ____________ Movements: ____________ Date: ____________ Start time: ____________ Stop  time: ____________ Movements: ____________ Date: ____________ Start time: ____________ Stop time: ____________ Movements: ____________ Date: ____________ Start time: ____________ Stop time: ____________ Movements: ____________ Date: ____________ Start time: ____________ Stop time: ____________ Movements: ____________ Date: ____________ Start time: ____________ Stop time: ____________ Movements: ____________ Date: ____________ Start time: ____________ Stop time: ____________ Movements: ____________ Date: ____________ Start time: ____________ Stop time: ____________ Movements: ____________ This information is not intended to replace advice given to you by your health care provider. Make sure you discuss any questions you have with your health care provider. Document Revised: 10/29/2018 Document Reviewed: 10/29/2018 Elsevier Patient Education  [place].

## 2020-01-14 NOTE — Telephone Encounter (Signed)
I called Melissa Medina to discuss her MyChart message re: fetal movement. I left a message if she is still feeling decreased fetal movement she should go to [facility] for evaluation today.  Alyn Jurney,RN

## 2020-01-14 NOTE — MAU Provider Note (Signed)
History     CSN: [id number]  Arrival date and time: 01/14/20 1349   First Provider Initiated Contact with Patient 01/14/20 1509      Chief Complaint  Patient presents with  . Decreased Fetal Movement  . Headache   HPI  Melissa Medina is a 49 y.o. [name] at [redacted]w[redacted]d who presents with decreased fetal movement. Reports decreased movement for the last 2 weeks. Feels 3-5 movements per day & has felt 1 movement since arrival to MAU. Also reports headache. Normally gets headaches. Current headache started today. Rates pain 5/10. Describes as constant aching. Has not treated symptoms. Nothing makes worse. Had elevated BP at OB appt last week but otherwise denies hypertension. Denies visual disturbance or epigastric pain.  Denies contractions, LOF, or vaginal bleeding.   OB History    Gravida  3   Para  2   Term  2   Preterm      AB      Living  2     SAB      TAB      Ectopic      Multiple      Live Births  2           Past Medical History:  Diagnosis Date  . Anemia   . Anxiety   . Bilateral plantar fasciitis   . Chronic headaches   . Fibroid   . Fibromyalgia   . Gestational diabetes   . Herniated disc, cervical   . Migraine   . Patellofemoral syndrome of left knee   . Patellofemoral syndrome of right knee   . PTSD (post-traumatic stress disorder)   . Sleep apnea     Past Surgical History:  Procedure Laterality Date  . APPENDECTOMY     childhood  . BREAST REDUCTION SURGERY  2009  . CHOLECYSTECTOMY    . DILATION AND CURETTAGE OF UTERUS    . LIPOSUCTION MULTIPLE BODY PARTS    . TONSILLECTOMY     childhood    Family History  Problem Relation Age of Onset  . Hypertension Mother   . Diabetes Mother   . Kidney failure Mother   . Healthy Father     Social History   Tobacco Use  . Smoking status: Never Smoker  . Smokeless tobacco: Never Used  Vaping Use  . Vaping Use: Never used  Substance Use Topics  . Alcohol use: No  . Drug use: No     Allergies: No Known Allergies  Medications Prior to Admission  Medication Sig Dispense Refill Last Dose  . aspirin EC 81 MG tablet Take 1 tablet (81 mg total) by mouth daily. Take after 12 weeks for prevention of preeclampsia later in pregnancy 300 tablet 2 01/13/2020 at Unknown time  . Calcium Carbonate-Vit D-Min (CALCIUM 1200 PO) Take by mouth.   01/13/2020 at Unknown time  . pantoprazole (PROTONIX) 40 MG tablet Take 1 tablet (40 mg total) by mouth daily. 60 tablet 1 01/13/2020 at Unknown time  . Prenatal Vit-Fe Fumarate-FA (PRENATAL MULTIVITAMIN) TABS tablet Take 1 tablet by mouth daily at 12 noon.   01/13/2020 at Unknown time  . vitamin B-12 (CYANOCOBALAMIN) 500 MCG tablet Take 1,000 mcg by mouth daily.   01/13/2020 at Unknown time  . vitamin C (ASCORBIC ACID) 250 MG tablet Take 250 mg by mouth every other day.    01/13/2020 at Unknown time  . acetaminophen (TYLENOL) 325 MG tablet Take 325-650 mg by mouth every 6 (six) hours as needed for  mild pain or headache. (Patient not taking: Reported on 01/04/2020)     . aluminum-magnesium hydroxide 200-200 MG/5ML suspension Take by mouth every 6 (six) hours as needed for indigestion. (Patient not taking: Reported on 01/04/2020)     . butalbital-acetaminophen-caffeine (FIORICET) 50-325-40 MG tablet Take 1-2 tablets by mouth every 6 (six) hours as needed for headache. (Patient not taking: Reported on 01/04/2020) 21 tablet 0   . calcium carbonate (TUMS - DOSED IN MG ELEMENTAL CALCIUM) 500 MG chewable tablet Chew 1 tablet by mouth daily.  (Patient not taking: Reported on 01/04/2020)     . ferrous sulfate 325 (65 FE) MG tablet Take 325 mg by mouth every other day.  (Patient not taking: Reported on 01/04/2020)       Review of Systems  Constitutional: Negative.   Eyes: Negative for visual disturbance.  Gastrointestinal: Negative.   Genitourinary: Negative.   Neurological: Positive for headaches.   Physical Exam   Blood pressure (!) 123/92, pulse  96, temperature 98.6 F (37 C), temperature source Oral, resp. rate 17, height 5\' 5"  (1.651 m), weight 103.7 kg, last menstrual period 06/05/2019, SpO2 97 %. Patient Vitals for the past 24 hrs:  BP Temp Temp src Pulse Resp SpO2 Height Weight  01/14/20 1827 (!) 136/92 -- -- 87 -- -- -- --  01/14/20 1631 114/83 -- -- 89 -- -- -- --  01/14/20 1620 -- -- -- -- -- 97 % -- --  01/14/20 1616 124/81 -- -- 87 -- -- -- --  01/14/20 1615 -- -- -- -- -- 98 % -- --  01/14/20 1610 -- -- -- -- -- 97 % -- --  01/14/20 1605 -- -- -- -- -- 97 % -- --  01/14/20 1601 124/85 -- -- 96 -- -- -- --  01/14/20 1550 -- -- -- -- -- 97 % -- --  01/14/20 1546 (!) 123/92 -- -- 96 -- -- -- --  01/14/20 1545 -- -- -- -- -- 98 % -- --  01/14/20 1540 -- -- -- -- -- 99 % -- --  01/14/20 1535 -- -- -- -- -- 98 % -- --  01/14/20 1531 124/86 -- -- (!) 103 -- -- -- --  01/14/20 1530 -- -- -- -- -- 97 % -- --  01/14/20 1525 -- -- -- -- -- 97 % -- --  01/14/20 1520 -- -- -- -- -- 97 % -- --  01/14/20 1516 123/83 -- -- (!) 107 -- -- -- --  01/14/20 1501 (!) 135/101 -- -- (!) 102 -- -- -- --  01/14/20 1500 -- -- -- -- -- 98 % -- --  01/14/20 1455 -- -- -- -- -- 99 % -- --  01/14/20 1450 -- -- -- -- -- 98 % -- --  01/14/20 1446 139/84 -- -- 98 -- -- -- --  01/14/20 1445 -- -- -- -- -- 98 % -- --  01/14/20 1440 -- -- -- -- -- 97 % -- --  01/14/20 1432 129/85 -- -- (!) 108 -- -- -- --  01/14/20 1430 -- -- -- -- -- 99 % -- --  01/14/20 1410 (!) 140/91 98.6 F (37 C) Oral (!) 102 17 100 % 5\' 5"  (1.651 m) 103.7 kg    Physical Exam Vitals and nursing note reviewed.  Constitutional:      Appearance: She is well-developed.  HENT:     Head: Normocephalic and atraumatic.  Cardiovascular:     Rate and Rhythm: Normal rate.  Heart sounds: Normal heart sounds.  Pulmonary:     Effort: Pulmonary effort is normal. No respiratory distress.  Musculoskeletal:        General: No swelling.  Skin:    General: Skin is warm.   Neurological:     Mental Status: She is alert.     Deep Tendon Reflexes: Reflexes normal.  Psychiatric:        Mood and Affect: Mood normal.        Behavior: Behavior normal.    Fetal Tracing:  Baseline: 140 Variability: moderate Accelerations: 15x15 Decelerations: none  Toco: none   MAU Course  Procedures Results for orders placed or performed during the hospital encounter of 01/14/20 (from the past 24 hour(s))  Urinalysis, Routine w reflex microscopic Urine, Clean Catch     Status: None   Collection Time: 01/14/20  2:13 PM  Result Value Ref Range   Color, Urine YELLOW YELLOW   APPearance CLEAR CLEAR   Specific Gravity, Urine 1.011 1.005 - 1.030   pH 6.0 5.0 - 8.0   Glucose, UA NEGATIVE NEGATIVE mg/dL   Hgb urine dipstick NEGATIVE NEGATIVE   Bilirubin Urine NEGATIVE NEGATIVE   Ketones, ur NEGATIVE NEGATIVE mg/dL   Protein, ur NEGATIVE NEGATIVE mg/dL   Nitrite NEGATIVE NEGATIVE   Leukocytes,Ua NEGATIVE NEGATIVE  Protein / creatinine ratio, urine     Status: None   Collection Time: 01/14/20  2:13 PM  Result Value Ref Range   Creatinine, Urine 116.79 mg/dL   Total Protein, Urine 7 mg/dL   Protein Creatinine Ratio 0.06 0.00 - 0.15 mg/mg[Cre]  CBC     Status: Abnormal   Collection Time: 01/14/20  3:34 PM  Result Value Ref Range   WBC 8.1 4.0 - 10.5 K/uL   RBC 3.96 3.87 - 5.11 MIL/uL   Hemoglobin 11.3 (L) 12.0 - 15.0 g/dL   HCT 34.1 (L) 36 - 46 %   MCV 86.1 80.0 - 100.0 fL   MCH 28.5 26.0 - 34.0 pg   MCHC 33.1 30.0 - 36.0 g/dL   RDW 14.4 11.5 - 15.5 %   Platelets 267 150 - 400 K/uL   nRBC 0.0 0.0 - 0.2 %  Comprehensive metabolic panel     Status: Abnormal   Collection Time: 01/14/20  3:34 PM  Result Value Ref Range   Sodium 135 135 - 145 mmol/L   Potassium 3.6 3.5 - 5.1 mmol/L   Chloride 105 98 - 111 mmol/L   CO2 22 22 - 32 mmol/L   Glucose, Bld 114 (H) 70 - 99 mg/dL   BUN 5 (L) 6 - 20 mg/dL   Creatinine, Ser 0.71 0.44 - 1.00 mg/dL   Calcium 8.9 8.9 - 10.3  mg/dL   Total Protein 6.2 (L) 6.5 - 8.1 g/dL   Albumin 2.5 (L) 3.5 - 5.0 g/dL   AST 24 15 - 41 U/L   ALT 27 0 - 44 U/L   Alkaline Phosphatase 129 (H) 38 - 126 U/L   Total Bilirubin 0.7 0.3 - 1.2 mg/dL   GFR, Estimated >60 >60 mL/min   Anion gap 8 5 - 15   No results found.  MDM Reactive fetal tracing with movement noted on monitor. Patient reports 3 movements in 2 hours while being monitored. BPP ordered & is 8/8 with normal AFI  Elevated DBP during OB visit last week. Labs at that time were normal. BPs elevated today; none severe range. Headache mild & treatable with tylenol. Preeclampsia labs normal. Gestational hypertension  diagnosis given. Will start antenatal testing at 32 wks - msg sent to office to schedule.   Patient diagnosed with GDM during her visit on 10/12. She is not scheduled to see diabetes educator until 10/28. I will prescribe diabetes testing supplies so she can keep track of CBGs until her next appointment. Educated regarding checking CBGs, ranges, keeping track of them, and when to check.    Assessment and Plan   1. Decreased fetal movement, third trimester, not applicable or unspecified fetus  -reactive fetal tracing & BPP 8/8  2. [redacted] weeks gestation of pregnancy   3. Gestational hypertension, third trimester  -reviewed preeclampsia precautions. Msg to [facility] to start antenatal testing next week  4. NST (non-stress test) reactive   5. Gestational diabetes mellitus (GDM) in third trimester, gestational diabetes method of control unspecified  -Diabetes testing supplies sent to pharmacy. Pt will call office on Monday if need different brand glucometer per her insurance     Jorje Guild 01/14/2020, 4:11 PM

## 2020-01-14 NOTE — MAU Provider Note (Signed)
History     CSN: [id number]  Arrival date and time: 01/14/20 1349   First Provider Initiated Contact with Patient 01/14/20 1509      Chief Complaint  Patient presents with  . Decreased Fetal Movement  . Headache    Melissa Medina is a 49 year old female with past medical history of recent diagnosed gestational diabetes, supervision of high risk pregnancy, antepartum; AMA (advanced maternal age) multigravida 11+; Acute nonintractable headache; Generalized body aches; Bilateral groin pain, who presents to the MAU for decreased fetal movement.    Patient states that she was instructed to monitor approximately 10 fetal movements in a day. In the past week she has felt approximately 3-5 fetal movement, which made her concerned. She denies abdominal pain, bilateral groin pain, or cramping. However she has headache and shortness of breath. She is currently taking Asprin, tums, and prenatal vitamin.   She was last seen at Dr. Virginia Crews office on 10/12 which showed concerns for elevated BP and new onset of gestational diabetes. She is currently waiting on referral for diabetes educator for monitoring her glucose levels.   OB History    Gravida  3   Para  2   Term  2   Preterm      AB      Living  2     SAB      TAB      Ectopic      Multiple      Live Births  2           Past Medical History:  Diagnosis Date  . Anemia   . Anxiety   . Bilateral plantar fasciitis   . Chronic headaches   . Fibroid   . Fibromyalgia   . Gestational diabetes   . Herniated disc, cervical   . Migraine   . Patellofemoral syndrome of left knee   . Patellofemoral syndrome of right knee   . PTSD (post-traumatic stress disorder)   . Sleep apnea     Past Surgical History:  Procedure Laterality Date  . APPENDECTOMY     childhood  . BREAST REDUCTION SURGERY  2009  . CHOLECYSTECTOMY    . DILATION AND CURETTAGE OF UTERUS    . LIPOSUCTION MULTIPLE BODY PARTS    . TONSILLECTOMY      childhood    Family History  Problem Relation Age of Onset  . Hypertension Mother   . Diabetes Mother   . Kidney failure Mother   . Healthy Father     Social History   Tobacco Use  . Smoking status: Never Smoker  . Smokeless tobacco: Never Used  Vaping Use  . Vaping Use: Never used  Substance Use Topics  . Alcohol use: No  . Drug use: No    Allergies: No Known Allergies  Medications Prior to Admission  Medication Sig Dispense Refill Last Dose  . aspirin EC 81 MG tablet Take 1 tablet (81 mg total) by mouth daily. Take after 12 weeks for prevention of preeclampsia later in pregnancy 300 tablet 2 01/13/2020 at Unknown time  . Calcium Carbonate-Vit D-Min (CALCIUM 1200 PO) Take by mouth.   01/13/2020 at Unknown time  . pantoprazole (PROTONIX) 40 MG tablet Take 1 tablet (40 mg total) by mouth daily. 60 tablet 1 01/13/2020 at Unknown time  . Prenatal Vit-Fe Fumarate-FA (PRENATAL MULTIVITAMIN) TABS tablet Take 1 tablet by mouth daily at 12 noon.   01/13/2020 at Unknown time  . vitamin B-12 (CYANOCOBALAMIN) 500  MCG tablet Take 1,000 mcg by mouth daily.   01/13/2020 at Unknown time  . vitamin C (ASCORBIC ACID) 250 MG tablet Take 250 mg by mouth every other day.    01/13/2020 at Unknown time  . acetaminophen (TYLENOL) 325 MG tablet Take 325-650 mg by mouth every 6 (six) hours as needed for mild pain or headache. (Patient not taking: Reported on 01/04/2020)     . aluminum-magnesium hydroxide 200-200 MG/5ML suspension Take by mouth every 6 (six) hours as needed for indigestion. (Patient not taking: Reported on 01/04/2020)     . butalbital-acetaminophen-caffeine (FIORICET) 50-325-40 MG tablet Take 1-2 tablets by mouth every 6 (six) hours as needed for headache. (Patient not taking: Reported on 01/04/2020) 21 tablet 0   . calcium carbonate (TUMS - DOSED IN MG ELEMENTAL CALCIUM) 500 MG chewable tablet Chew 1 tablet by mouth daily.  (Patient not taking: Reported on 01/04/2020)     . ferrous  sulfate 325 (65 FE) MG tablet Take 325 mg by mouth every other day.  (Patient not taking: Reported on 01/04/2020)       Review of Systems  Constitutional: Negative.   HENT: Negative.   Eyes: Negative.   Respiratory: Negative.   Cardiovascular: Negative.   Gastrointestinal: Positive for abdominal pain.  Endocrine: Negative.   Genitourinary: Negative.   Musculoskeletal: Negative.   Skin: Negative.   Allergic/Immunologic: Negative.   Neurological: Negative.   Hematological: Negative.   Psychiatric/Behavioral: Negative.    Physical Exam   Blood pressure 114/83, pulse 89, temperature 98.6 F (37 C), temperature source Oral, resp. rate 17, height 5\' 5"  (1.651 m), weight 103.7 kg, last menstrual period 06/05/2019, SpO2 97 %.  Physical Exam Vitals reviewed.  Constitutional:      Appearance: She is well-developed.  HENT:     Head: Normocephalic.  Cardiovascular:     Rate and Rhythm: Normal rate and regular rhythm.     Heart sounds: No murmur heard.  No friction rub. No gallop.   Pulmonary:     Effort: Pulmonary effort is normal. No respiratory distress.     Breath sounds: Normal breath sounds. No stridor.  Abdominal:     Palpations: Abdomen is soft.  Musculoskeletal:        General: Normal range of motion.     Cervical back: Normal range of motion.  Skin:    General: Skin is warm.  Neurological:     Mental Status: She is alert.  Psychiatric:        Mood and Affect: Mood normal.        Behavior: Behavior normal.        Thought Content: Thought content normal.    Results for orders placed or performed during the hospital encounter of 01/14/20 (from the past 24 hour(s))  Urinalysis, Routine w reflex microscopic Urine, Clean Catch     Status: None   Collection Time: 01/14/20  2:13 PM  Result Value Ref Range   Color, Urine YELLOW YELLOW   APPearance CLEAR CLEAR   Specific Gravity, Urine 1.011 1.005 - 1.030   pH 6.0 5.0 - 8.0   Glucose, UA NEGATIVE NEGATIVE mg/dL   Hgb  urine dipstick NEGATIVE NEGATIVE   Bilirubin Urine NEGATIVE NEGATIVE   Ketones, ur NEGATIVE NEGATIVE mg/dL   Protein, ur NEGATIVE NEGATIVE mg/dL   Nitrite NEGATIVE NEGATIVE   Leukocytes,Ua NEGATIVE NEGATIVE  Protein / creatinine ratio, urine     Status: None   Collection Time: 01/14/20  2:13 PM  Result Value Ref  Range   Creatinine, Urine 116.79 mg/dL   Total Protein, Urine 7 mg/dL   Protein Creatinine Ratio 0.06 0.00 - 0.15 mg/mg[Cre]  CBC     Status: Abnormal   Collection Time: 01/14/20  3:34 PM  Result Value Ref Range   WBC 8.1 4.0 - 10.5 K/uL   RBC 3.96 3.87 - 5.11 MIL/uL   Hemoglobin 11.3 (L) 12.0 - 15.0 g/dL   HCT 34.1 (L) 36 - 46 %   MCV 86.1 80.0 - 100.0 fL   MCH 28.5 26.0 - 34.0 pg   MCHC 33.1 30.0 - 36.0 g/dL   RDW 14.4 11.5 - 15.5 %   Platelets 267 150 - 400 K/uL   nRBC 0.0 0.0 - 0.2 %  Comprehensive metabolic panel     Status: Abnormal   Collection Time: 01/14/20  3:34 PM  Result Value Ref Range   Sodium 135 135 - 145 mmol/L   Potassium 3.6 3.5 - 5.1 mmol/L   Chloride 105 98 - 111 mmol/L   CO2 22 22 - 32 mmol/L   Glucose, Bld 114 (H) 70 - 99 mg/dL   BUN 5 (L) 6 - 20 mg/dL   Creatinine, Ser 0.71 0.44 - 1.00 mg/dL   Calcium 8.9 8.9 - 10.3 mg/dL   Total Protein 6.2 (L) 6.5 - 8.1 g/dL   Albumin 2.5 (L) 3.5 - 5.0 g/dL   AST 24 15 - 41 U/L   ALT 27 0 - 44 U/L   Alkaline Phosphatase 129 (H) 38 - 126 U/L   Total Bilirubin 0.7 0.3 - 1.2 mg/dL   GFR, Estimated >60 >60 mL/min   Anion gap 8 5 - 15    MAU Course  Procedures  HTN  - In the last vitis with Dr. Kennon Rounds on 01/04/20 patient had elevated BP with symptoms 124/91 - Pt currently has episodes of elevated BP during MAU stay - will start work-up for preeclampsia and compare labs from 01/04/20 - CBC, CMP, Protein/Creatine ratio  Decreased Fetal movement - Fetal monitoring - U/s was deferred due to good fetal movements on the monitor   Assessment and Plan   HTN -New diagnosis of gestational hypertension  during this visit. Pt had elevated BP at Dr. Kennon Rounds clinic 10/12 and today at [facility] 10/22.  - Due to recent diagnosis of gestational hypertension and diabetes, recommend close weekly follow-up.  - No concerns for preeclampsia today.   -CMP, CBC, Protein/Cr ratio within normal limits  - Future appointment with Dr. Claiborne Billings on 10/28.    Decreased fetal Movement  - Good fetal movement in the monitor  - No ultrasound was obtained today   -Discharge home in stable condition  -High blood pressure precautions discussed. Encouraged patient to measure home BP for monitoring.  -Patient advised to follow-up with Dr. Claiborne Billings on 10/28 at [place] [facility] -Patient may return to MAU as needed or if her condition were to change or worsen   Yitzel Shasteen Gi Sevon Rotert 01/14/2020, 5:16 PM

## 2020-01-17 ENCOUNTER — Ambulatory Visit: Payer: No Typology Code available for payment source | Admitting: *Deleted

## 2020-01-17 ENCOUNTER — Other Ambulatory Visit: Payer: Self-pay | Admitting: *Deleted

## 2020-01-17 ENCOUNTER — Encounter: Payer: Self-pay | Admitting: *Deleted

## 2020-01-17 ENCOUNTER — Other Ambulatory Visit: Payer: Self-pay

## 2020-01-17 ENCOUNTER — Ambulatory Visit: Payer: No Typology Code available for payment source | Attending: Obstetrics and Gynecology

## 2020-01-17 DIAGNOSIS — O09523 Supervision of elderly multigravida, third trimester: Secondary | ICD-10-CM | POA: Insufficient documentation

## 2020-01-17 DIAGNOSIS — O0990 Supervision of high risk pregnancy, unspecified, unspecified trimester: Secondary | ICD-10-CM | POA: Insufficient documentation

## 2020-01-17 DIAGNOSIS — O3413 Maternal care for benign tumor of corpus uteri, third trimester: Secondary | ICD-10-CM

## 2020-01-17 DIAGNOSIS — O133 Gestational [pregnancy-induced] hypertension without significant proteinuria, third trimester: Secondary | ICD-10-CM | POA: Diagnosis not present

## 2020-01-17 DIAGNOSIS — O99213 Obesity complicating pregnancy, third trimester: Secondary | ICD-10-CM

## 2020-01-17 DIAGNOSIS — Z362 Encounter for other antenatal screening follow-up: Secondary | ICD-10-CM

## 2020-01-17 DIAGNOSIS — O09813 Supervision of pregnancy resulting from assisted reproductive technology, third trimester: Secondary | ICD-10-CM

## 2020-01-17 DIAGNOSIS — E669 Obesity, unspecified: Secondary | ICD-10-CM

## 2020-01-17 DIAGNOSIS — O321XX0 Maternal care for breech presentation, not applicable or unspecified: Secondary | ICD-10-CM

## 2020-01-17 DIAGNOSIS — Z3A32 32 weeks gestation of pregnancy: Secondary | ICD-10-CM

## 2020-01-17 DIAGNOSIS — D259 Leiomyoma of uterus, unspecified: Secondary | ICD-10-CM

## 2020-01-18 ENCOUNTER — Other Ambulatory Visit: Payer: Self-pay | Admitting: General Practice

## 2020-01-18 ENCOUNTER — Encounter: Payer: Self-pay | Admitting: General Practice

## 2020-01-18 DIAGNOSIS — O24410 Gestational diabetes mellitus in pregnancy, diet controlled: Secondary | ICD-10-CM

## 2020-01-20 ENCOUNTER — Encounter: Payer: No Typology Code available for payment source | Attending: Obstetrics & Gynecology | Admitting: Registered"

## 2020-01-20 ENCOUNTER — Ambulatory Visit (INDEPENDENT_AMBULATORY_CARE_PROVIDER_SITE_OTHER): Payer: No Typology Code available for payment source | Admitting: Obstetrics & Gynecology

## 2020-01-20 ENCOUNTER — Ambulatory Visit: Payer: No Typology Code available for payment source | Admitting: Registered"

## 2020-01-20 ENCOUNTER — Other Ambulatory Visit: Payer: Self-pay

## 2020-01-20 ENCOUNTER — Encounter: Payer: Self-pay | Admitting: Obstetrics & Gynecology

## 2020-01-20 ENCOUNTER — Ambulatory Visit: Payer: No Typology Code available for payment source | Admitting: Clinical

## 2020-01-20 VITALS — BP 132/86 | HR 104 | Wt 229.0 lb

## 2020-01-20 DIAGNOSIS — R5382 Chronic fatigue, unspecified: Secondary | ICD-10-CM

## 2020-01-20 DIAGNOSIS — R300 Dysuria: Secondary | ICD-10-CM

## 2020-01-20 DIAGNOSIS — O133 Gestational [pregnancy-induced] hypertension without significant proteinuria, third trimester: Secondary | ICD-10-CM

## 2020-01-20 DIAGNOSIS — Z23 Encounter for immunization: Secondary | ICD-10-CM

## 2020-01-20 DIAGNOSIS — O0993 Supervision of high risk pregnancy, unspecified, third trimester: Secondary | ICD-10-CM

## 2020-01-20 DIAGNOSIS — O24419 Gestational diabetes mellitus in pregnancy, unspecified control: Secondary | ICD-10-CM | POA: Diagnosis not present

## 2020-01-20 DIAGNOSIS — Z3A00 Weeks of gestation of pregnancy not specified: Secondary | ICD-10-CM | POA: Insufficient documentation

## 2020-01-20 DIAGNOSIS — O09523 Supervision of elderly multigravida, third trimester: Secondary | ICD-10-CM

## 2020-01-20 DIAGNOSIS — O0990 Supervision of high risk pregnancy, unspecified, unspecified trimester: Secondary | ICD-10-CM

## 2020-01-20 DIAGNOSIS — O24410 Gestational diabetes mellitus in pregnancy, diet controlled: Secondary | ICD-10-CM

## 2020-01-20 DIAGNOSIS — Z3A32 32 weeks gestation of pregnancy: Secondary | ICD-10-CM

## 2020-01-20 DIAGNOSIS — F4310 Post-traumatic stress disorder, unspecified: Secondary | ICD-10-CM

## 2020-01-20 LAB — POCT URINALYSIS DIP (DEVICE)
Bilirubin Urine: NEGATIVE
Glucose, UA: 100 mg/dL — AB
Hgb urine dipstick: NEGATIVE
Ketones, ur: 15 mg/dL — AB
Leukocytes,Ua: NEGATIVE
Nitrite: NEGATIVE
Protein, ur: 30 mg/dL — AB
Specific Gravity, Urine: >=1.03 (ref 1.005–1.030)
Urobilinogen, UA: 0.2 mg/dL (ref 0.0–1.0)
pH: 5.5 (ref 5.0–8.0)

## 2020-01-20 MED ORDER — FREESTYLE TEST VI STRP: 1.0000 | ORAL_STRIP | Freq: Four times a day (QID) | 2 refills | Status: DC

## 2020-01-20 MED ORDER — PANTOPRAZOLE SODIUM 40 MG PO TBEC: 40.0000 mg | DELAYED_RELEASE_TABLET | Freq: Every day | ORAL | 3 refills | Status: DC

## 2020-01-20 MED ORDER — FREESTYLE SYSTEM KIT: 1.0000 | PACK | 0 refills | Status: DC | PRN

## 2020-01-20 NOTE — Patient Instructions (Signed)
Center for [facility] at [facility] for Women [place], [place] [address] [phone] (main office) [phone] ([place] office)  New mom support group(including for [place]!): www.postpartum.net  /Emotional [employer] and Websites Here are a few free apps meant to help you to help yourself.  To find, try searching on the internet to see if the app is offered on Apple/Android devices. If your first choice doesn't come up on your device, the good news is that there are many choices! Play around with different apps to see which ones are helpful to you.    Calm This is an app meant to help increase calm feelings. Includes info, strategies, and tools for tracking your feelings.      Calm Harm  This app is meant to help with self-harm. Provides many 5-minute or 15-min coping strategies for doing instead of hurting yourself.       [place] is a problem-solving tool to help deal with emotions and cope with stress you encounter wherever you are.      MindShift This app can help people cope with anxiety. Rather than trying to avoid anxiety, you can make an important shift and face it.      MY3  MY3 features a support system, safety plan and resources with the goal of offering a tool to use in a time of need.       My Life My Voice  This mood journal offers a simple solution for tracking your thoughts, feelings and moods. Animated emoticons can help identify your mood.       Relax Melodies Designed to help with sleep, on this app you can mix sounds and meditations for relaxation.      Smiling Mind Smiling Mind is meditation made easy: it's a simple tool that helps put a smile on your mind.        Stop, Breathe & Think  A friendly, simple guide for people through meditations for mindfulness and compassion.  Stop, Breathe and Think Kids Enter your current feelings and choose a "mission" to help you cope. Offers  videos for certain moods instead of just sound recordings.       Team Orange The goal of this tool is to help teens change how they think, act, and react. This app helps you focus on your own good feelings and experiences.      The [place] Box The [place] Box (VHB) contains simple tools to help patients with coping, relaxation, distraction, and positive thinking.    Coping with Panic Attacks   What is a panic attack?  You may have had a panic attack if you experienced four or more of the symptoms listed below coming on abruptly and peaking in about 10 minutes.  Panic Symptoms   . Pounding heart  . Sweating  . Trembling or shaking  . Shortness of breath  . Feeling of choking  . Chest pain  . Nausea or abdominal distress    . Feeling dizzy, unsteady, lightheaded, or faint  . Feelings of unreality or being detached from yourself  . Fear of losing control or going crazy  . Fear of dying  . Numbness or tingling  . Chills or hot flashes      Panic attacks are sometimes accompanied by avoidance of certain places or situations. These are often situations that would be difficult to escape from or in which help might not be available. Examples might include crowded shopping  malls, public transportation, restaurants, or driving.   Why do panic attacks occur?   Panic attacks are the body's alarm system gone awry. All of [place] have a built-in alarm system, powered by adrenaline, which increases our heart rate, breathing, and blood flow in response to danger. Ordinarily, this 'danger response system' works well. In some people, however, the response is either out of proportion to whatever stress is going on, or may come out of the blue without any stress at all.   For example, if you are walking in the woods and see a bear coming your way, a variety of changes occur in your body to prepare you to either fight the danger or flee from the situation. Your heart rate will increase to get  more blood flow around your body, your breathing rate will quicken so that more oxygen is available, and your muscles will tighten in order to be ready to fight or run. You may feel nauseated as blood flow leaves your stomach area and moves into your limbs. These bodily changes are all essential to helping you survive the dangerous situation. After the danger has passed, your body functions will begin to go back to normal. This is because your body also has a system for "recovering" by bringing your body back down to a normal state when the danger is over.   As you can see, the emergency response system is adaptive when there is, in fact, a "true" or "real" danger (e.g., bear). However, sometimes people find that their emergency response system is triggered in "everyday" situations where there really is no true physical danger (e.g., in a meeting, in the grocery store, while driving in normal traffic, etc.).   What triggers a panic attack?  Sometimes particularly stressful situations can trigger a panic attack. For example, an argument with your spouse or stressors at work can cause a stress response (activating the emergency response system) because you perceive it as threatening or overwhelming, even if there is no direct risk to your survival.  Sometimes panic attacks don't seem to be triggered by anything in particular- they may "come out of the blue". Somehow, the natural "fight or flight" emergency response system has gotten activated when there is no real danger. Why does the body go into "emergency mode" when there is no real danger?   Often, people with panic attacks are frightened or alarmed by the physical sensations of the emergency response system. First, unexpected physical sensations are experienced (tightness in your chest or some shortness of breath). This then leads to feeling fearful or alarmed by these symptoms ("Something's wrong!", "Am I having a heart attack?", "Am I going to faint?")  The mind perceives that there is a danger even though no real danger exists. This, in turn, activates the emergency response system ("fight or flight"), leading to a "full blown" panic attack. In summary, panic attacks occur when we misinterpret physical symptoms as signs of impending death, craziness, loss of control, embarrassment, or fear of fear. Sometimes you may be aware of thoughts of danger that activate the emergency response system (for example, thinking "I'm having a heart attack" when you feel chest pressure or increased heart rate). At other times, however, you may not be aware of such thoughts. After several incidences of being afraid of physical sensations, anxiety and panic can occur in response to the initial sensations without conscious thoughts of danger. Instead, you just feel afraid or alarmed. In other words, the panic or fear may  seem to occur "automatically" without you consciously telling yourself anything.   After having had one or more panic attacks, you may also become more focused on what is going on inside your body. You may scan your body and be more vigilant about noticing any symptoms that might signal the start of a panic attack. This makes it easier for panic attacks to happen again because you pick up on sensations you might otherwise not have noticed, and misinterpret them as something dangerous. A panic attack may then result.      How do I cope with panic attacks?  An important part of overcoming panic attacks involves re-interpreting your body's physical reactions and teaching yourself ways to decrease the physical arousal. This can be done through practicing the cognitive and behavioral interventions below.   Research has found that over half of people who have panic attacks show some signs of hyperventilation or overbreathing. This can produce initial sensations that alarm you and lead to a panic attack. Overbreathing can also develop as part of the panic attack  and make the symptoms worse. When people hyperventilate, certain blood vessels in the body become narrower. In particular, the brain may get slightly less oxygen. This can lead to the symptoms of dizziness, confusion, and lightheadedness that often occur during panic attacks. Other parts of the body may also get a bit less oxygen, which may lead to numbness or tingling in the hands or feet or the sensation of cold, clammy hands. It also may lead the heart to pump harder. Although these symptoms may be frightening and feel unpleasant, it is important to remember that hyperventilating is not dangerous. However, you can help overcome the unpleasantness of overbreathing by practicing Breathing Retraining.   Practice this basic technique three times a day, every day:  . Inhale. With your shoulders relaxed, inhale as slowly and deeply as you can while you count to six. If you can, use your diaphragm to fill your lungs with air.  . Hold. Keep the air in your lungs as you slowly count to four.  . Exhale. Slowly breath out as you count to six.  . Repeat. Do the inhale-hold-exhale cycle several times. Each time you do it, exhale for longer counts.  Like any new skill, Breathing Retraining requires practice. Try practicing this skill twice a day for several minutes. Initially, do not try this technique in specific situations or when you become frightened or have a panic attack. Begin by practicing in a quiet environment to build up your skill level so that you can later use it in time of "emergency."   2. Decreasing Avoidance  Regardless of whether you can identify why you began having panic attacks or whether they seemed to come out of the blue, the places where you began having panic attacks often can become triggers themselves. It is not uncommon for individuals to begin to avoid the places where they have had panic attacks. Over time, the individual may begin to avoid more and more places, thereby decreasing  their activities and often negatively impacting their quality of life. To break the cycle of avoidance, it is important to first identify the places or situations that are being avoided, and then to do some "relearning."  To begin this intervention, first create a list of locations or situations that you tend to avoid. Then choose an avoided location or situation that you would like to target first. Now develop an "exposure hierarchy" for this situation or location. An "  exposure hierarchy" is a list of actions that make you feel anxious in this situation. Order these actions from least to most anxiety-producing. It is often helpful to have the first item on your hierarchy involve thinking or imagining part of the feared/avoided situation.   Here is an example of an exposure hierarchy for decreasing avoidance of the grocery store. Note how it is ordered from the least amount of anxiety (at the top) to the most anxiety (at the bottom):  Marland Kitchen Think about going to the grocery store alone.  . Go to the grocery store with a friend or family member.  . Go to the grocery store alone to pick up a few small items (5-10 minutes in the store).  . Shopping for 10-20 minutes in the store alone.  . Doing the shopping for the week by myself (20-30 minutes in the store).   Your homework is to "expose" yourself to the lowest item on your hierarchy and use your breathing relaxation and coping statements (see below) to help you remain in the situation. Practice this several times during the upcoming week. Once you have mastered each item with minimal anxiety, move on to the next higher action on your list.   Cognitive Interventions  1. Identify your negative self-talk Anxious thoughts can increase anxiety symptoms and panic. The first step in changing anxious thinking is to identify your own negative, alarming self-talk. Some common alarming thoughts:  . I'm having a heart attack.            . I must be going crazy. . I  think I'm dying. Marland Kitchen People will think I'm crazy. . I'm going to pass our.  . Oh no- here it comes.  . I can't stand this.  Rene Paci got to get out of here!  2. Use positive coping statements Changing or disrupting a pattern of anxious thoughts by replacing them with more calming or supportive statements can help to divert a panic attack. Some common helpful coping statements:  . This is not an emergency.  . I don't like feeling this way, but I can accept it.  . I can feel like this and still be okay.  . This has happened before, and I was okay. I'll be okay this time, too.  . I can be anxious and still deal with this situation.

## 2020-01-20 NOTE — Progress Notes (Signed)
History:  Melissa Medina is a 49 y.o. [name] at 29w4dwho presents to clinic today for ongoing prenatal care.  She is currently monitored for the following issues for this high-risk pregnancy and has Supervision of high risk pregnancy, antepartum; AMA (advanced maternal age) multigravida 340+ Acute nonintractable headache; Generalized body aches; Bilateral groin pain; Pregnancy resulting from in vitro fertilization; Group B streptococcal bacteriuria; Obesity; Abnormal uterine bleeding; Anxiety; Bilateral fibrocystic breast changes; Cough; Depression; Pain syndrome, chronic; Fibromyalgia; Chronic low back pain; Gastroesophageal reflux disease; History of cervical dysplasia; History of reduction mammoplasty; Iron deficiency anemia due to chronic blood loss; Menometrorrhagia; Pyelonephritis; Restless legs syndrome; Sleep apnea; Uterine fibroid; Vitamin D deficiency; Migraine without aura and responsive to treatment; and Posttraumatic stress disorder on their problem list.   Patient reports continued generalized polyuria, dysuria, fatigue, headache and overall decrease in energy. The symptom has been present for 1 month and continues. Patient has seen Dr. PKennon Roundson 01/04/20 and was seen at the [facility] for similar symptoms on 01/14/20. Work-up for preeclampsia was negative and past and today's NPT was reassuring. Contractions: Not present. Vag. Bleeding: None.  Movement: Present. Denies leaking of fluid.   The following portions of the patient's history were reviewed and updated as appropriate: allergies, current medications, family history, past medical history, social history, past surgical history and problem list.  Review of Systems:  Review of Systems  Constitutional: Positive for malaise/fatigue.  HENT: Negative.   Eyes: Negative.   Respiratory: Negative.   Cardiovascular: Negative.   Gastrointestinal: Negative.   Genitourinary: Negative.   Musculoskeletal: Negative.   Skin: Negative.  Negative for  rash.  Neurological: Positive for headaches.  Endo/Heme/Allergies: Negative.   Psychiatric/Behavioral: Negative.       Objective:  Physical Exam BP 132/86   Pulse (!) 104   Wt 229 lb (103.9 kg)   LMP 06/05/2019   BMI 38.11 kg/m  Physical Exam Vitals and nursing note reviewed. Exam conducted with a chaperone present.  Constitutional:      Appearance: Normal appearance.  HENT:     Mouth/Throat:     Mouth: Mucous membranes are moist.  Cardiovascular:     Rate and Rhythm: Normal rate and regular rhythm.     Heart sounds: No murmur heard.  No friction rub. No gallop.   Pulmonary:     Effort: Pulmonary effort is normal.     Breath sounds: Normal breath sounds.  Abdominal:     Palpations: Abdomen is soft.  Musculoskeletal:     Cervical back: Normal range of motion. No rigidity.  Skin:    General: Skin is warm.  Neurological:     Mental Status: She is alert.  Psychiatric:        Mood and Affect: Mood normal.        Behavior: Behavior normal.        Thought Content: Thought content normal.      Labs and Imaging No results found for this or any previous visit (from the past 24 hour(s)).  No results found.   Assessment & Plan:  1. Supervision of high risk pregnancy, antepartum - Continue weekly monitoring, future apt 01/25/20 - Fetal nonstress test; Future - Flu Vaccine QUAD 36+ mos IM   2. Multigravida of advanced maternal age in third trimester -Normal NIPT and preimplanatation genetics - On ASA - [place] 01/16/20 is reassuring and normal growth  - Weekly BPP due to Gestational HTN, and DM   3. Diet controlled gestational diabetes mellitus (GDM)  in third trimester - Met with Diabetes Counselor today.  - Encouraged to check glucose four times a day  - Fatigue, and polyuria could possibly from elevated glucose.  - Patient does not have the machine, sent another prescription for lancets, and monitoring kit.   4. Gestational hypertension, third trimester -  Protein/ Creatine urine today - CBC and CMET sent to r/o preeclamsia - Fetal nonstress test; Future  5. Group B streptococcal bacteriuria -Penicillin upon delivery  6. Polyuria and dysuria -Urine Culture sent -Possibly from uncontrolled diabetes   7. Fatigue and generalize myalgia - With increased maternal age difficult to differentiate between fatigue with pregnancy vs fibromyalgia vs past medical history comorbity.  -Continue to monitor with weekly visit.      Lajoyce Lauber, Medical Student 01/20/2020 11:59 AM

## 2020-01-20 NOTE — BH Specialist Note (Signed)
Integrated Behavioral Health Initial Visit  MRN: [id number] Name: Melissa Medina  Number of [place] Clinician visits:: 1/6 Session Start time: 10:40  Session End time: 10:50 Total time: 10  Type of Service: [place] Interpretor:No. Interpretor Name and Language: n/a   Warm Hand Off Completed.       SUBJECTIVE: Melissa Medina is a 49 y.o. female accompanied by n/a Patient was referred by Unknown Jim, MD for positive depression screen. Patient reports the following symptoms/concerns: Pt states she was being treated for PTSD by her PCP, but stopped taking medication during pregnancy; pt states it is getting more difficult to manage symptoms with current self-coping strategies, and is having sleep difficulty.  Duration of problem: Ongoing; Severity of problem: severe  OBJECTIVE: Mood: Anxious and Affect: Appropriate Risk of harm to self or others: No plan to harm self or others  LIFE CONTEXT: Family and Social: Pt lives with her husband School/Work: Pt works fulltime with the [employer] Self-Care: deep breathing and taking a walk outside as needed Life Changes: Current pregnancy  GOALS ADDRESSED: Patient will: 1. Reduce symptoms of: anxiety, depression, insomnia and stress 2. Increase knowledge and/or ability of: healthy habits  3. Demonstrate ability to: Increase healthy adjustment to current life circumstances and Increase adequate support systems for patient/family  INTERVENTIONS: Interventions utilized: Supportive Counseling, Psychoeducation and/or Health Education and Link to [employer]  Standardized Assessments completed: GAD-7 and PHQ 9  ASSESSMENT: Patient currently experiencing PTSD, as previously diagnosed.   Patient may benefit from psychoeducation and brief therapeutic interventions regarding coping with symptoms of anxiety, depression, insomnia and current life stress .  PLAN: 1. Follow up with  behavioral health clinician on : Within one month 2. Behavioral recommendations:  -Continue using self-coping strategies that have helped in the past; consider trying at least one app from list (on AVS) to see if it helps to distract mind and lessen anxiety -Read educational materials regarding coping with symptoms of anxiety with panic -Consider registering for and attending online [place] mom support group at www.postpartum.net  3. Referral(s): Integrated [employer] (In Clinic) and [employer] Resources:  New mom support  Garlan Fair, [place]  Depression screen [facility] 2/9 01/20/2020 01/04/2020 12/03/2019 10/20/2019 09/22/2019  Decreased Interest 3 3 2 1 1   Down, Depressed, Hopeless 3 2 2 1 1   PHQ - 2 Score 6 5 4 2 2   Altered sleeping 3 0 3 2 1   Tired, decreased energy 3 3 3 1 2   Change in appetite 3 3 3 1 2   Feeling bad or failure about yourself  2 2 2 2 1   Trouble concentrating 3 2 2 2 1   Moving slowly or fidgety/restless 2 1 1  0 0  Suicidal thoughts 0 0 0 0 0  PHQ-9 Score 22 16 18 10 9    GAD 7 : Generalized Anxiety Score 01/20/2020 01/04/2020 12/03/2019 10/20/2019  Nervous, Anxious, on Edge 3 2 3 2   Control/stop worrying 3 3 3 2   Worry too much - different things 3 3 2 2   Trouble relaxing 3 3 2 2   Restless 3 2 2 1   Easily annoyed or irritable 3 3 2 2   Afraid - awful might happen 3 3 2 1   Total GAD 7 Score 21 19 16  12

## 2020-01-20 NOTE — Patient Instructions (Signed)
Let us know if you have any trouble with the glucometer prescription.  You may not be getting enough protein in your diet. Aim to get protein at each meal - Ideas for protein. Consider switching to the Protein Max Ensure and have some fruit with it [place] yogurt Nuts Cheese Peanut butter  If you cereal makes your blood sugar too high, use it to sprinkle in [place] yogurt. Consider eating whole grains, but still consider amount due to carbohydrates  Be careful with beverages that are sweetened

## 2020-01-20 NOTE — Progress Notes (Signed)
Patient was seen on 01/20/20 for Gestational Diabetes self-management. EDD 03/12/20. Patient states no history of GDM. Diet history obtained. Patient eats variety of all food groups. Beverages include water, matcha tea from [employer].  Patient reports no structured physical activity since pregnancy, but was active before. Pt states it is painful with fibromyalgia.  Pt states she has not had a big appetite, has lost desire to eat meat with pregnancy, and is supplementing with original Ensure to make up for reduced appetite. Original Ensure only has 9 g protein.   The following learning objectives were met by the patient :   States the definition of Gestational Diabetes  States why dietary management is important in controlling blood glucose  Describes the effects of carbohydrates on blood glucose levels  Demonstrates ability to create a balanced meal plan  Demonstrates carbohydrate counting   States when to check blood glucose levels  Demonstrates proper blood glucose monitoring techniques  States the effect of stress and exercise on blood glucose levels  States the importance of limiting caffeine and abstaining from alcohol and smoking  Plan:  Aim for 3 Carbohydrate Choices per meal (45 grams) +/- 1 either way  Aim for 1-2 Carbohydrate Choices per snack Begin reading food labels for Total Carbohydrate of foods If OK with your MD, consider  increasing your activity level by walking, Arm Chair Exercises or other activity daily as tolerated Begin checking Blood Glucose before breakfast and 2 hours after first bite of breakfast, lunch and dinner as directed by MD  Bring Log Book/Sheet and meter to every medical appointment  Baby Scripts: (BS 2.0 not capable of glucose management at this time.) Patient to record blood sugar on glucose log sheet  Take medication if directed by MD  Rx order in Epic for Freestyle Lite meter. Pt states her mother has diabetes and feels confident she will  know how to use meter.  Patient instructed to monitor glucose levels: FBS: 60 - 95 mg/dl 2 hour: <120 mg/dl  Patient received the following handouts:  Nutrition Diabetes and Pregnancy  Carbohydrate Counting List  Blood glucose Log Sheet  Patient will be seen for follow-up as needed.

## 2020-01-21 ENCOUNTER — Telehealth: Payer: Self-pay | Admitting: Lactation Services

## 2020-01-21 LAB — PROTEIN / CREATININE RATIO, URINE
Creatinine, Urine: 250 mg/dL
Protein, Ur: 25.9 mg/dL
Protein/Creat Ratio: 104 mg/g creat (ref 0–200)

## 2020-01-21 LAB — COMPREHENSIVE METABOLIC PANEL
ALT: 28 IU/L (ref 0–32)
AST: 23 IU/L (ref 0–40)
Albumin/Globulin Ratio: 1.4 (ref 1.2–2.2)
Albumin: 3.3 g/dL — ABNORMAL LOW (ref 3.8–4.9)
Alkaline Phosphatase: 164 IU/L — ABNORMAL HIGH (ref 44–121)
BUN/Creatinine Ratio: 10 (ref 9–23)
BUN: 6 mg/dL (ref 6–24)
Bilirubin Total: 0.4 mg/dL (ref 0.0–1.2)
CO2: 20 mmol/L (ref 20–29)
Calcium: 9 mg/dL (ref 8.7–10.2)
Chloride: 103 mmol/L (ref 96–106)
Creatinine, Ser: 0.63 mg/dL (ref 0.57–1.00)
GFR calc Af Amer: 120 mL/min/{1.73_m2} (ref >59)
GFR calc non Af Amer: 104 mL/min/{1.73_m2} (ref >59)
Globulin, Total: 2.3 g/dL (ref 1.5–4.5)
Glucose: 140 mg/dL — ABNORMAL HIGH (ref 65–99)
Potassium: 4.1 mmol/L (ref 3.5–5.2)
Sodium: 137 mmol/L (ref 134–144)
Total Protein: 5.6 g/dL — ABNORMAL LOW (ref 6.0–8.5)

## 2020-01-21 LAB — CBC
Hematocrit: 33.7 % — ABNORMAL LOW (ref 34.0–46.6)
Hemoglobin: 11.2 g/dL (ref 11.1–15.9)
MCH: 28.9 pg (ref 26.6–33.0)
MCHC: 33.2 g/dL (ref 31.5–35.7)
MCV: 87 fL (ref 79–97)
Platelets: 249 10*3/uL (ref 150–450)
RBC: 3.87 x10E6/uL (ref 3.77–5.28)
RDW: 13.9 % (ref 11.7–15.4)
WBC: 8.4 10*3/uL (ref 3.4–10.8)

## 2020-01-21 LAB — TSH: TSH: 1.48 u[IU]/mL (ref 0.450–4.500)

## 2020-01-21 NOTE — Telephone Encounter (Signed)
Patient sent a message that she was not able to get her Glucometer at [facility] this morning.    [place] and they report they are not able to order the Freestyle  From their vendors.   Asked if they are able to order the Accu chek Freestyle Lite as noted in Diabetes Educator notes. [facility] indicates they have the Freestyle Lite in house. Will notify patient.

## 2020-01-22 LAB — URINE CULTURE, OB REFLEX

## 2020-01-22 LAB — CULTURE, OB URINE

## 2020-01-24 ENCOUNTER — Ambulatory Visit: Payer: No Typology Code available for payment source

## 2020-01-25 ENCOUNTER — Ambulatory Visit: Payer: No Typology Code available for payment source | Admitting: *Deleted

## 2020-01-25 ENCOUNTER — Ambulatory Visit: Payer: No Typology Code available for payment source | Attending: Obstetrics | Admitting: *Deleted

## 2020-01-25 ENCOUNTER — Encounter: Payer: Self-pay | Admitting: *Deleted

## 2020-01-25 ENCOUNTER — Other Ambulatory Visit: Payer: Self-pay

## 2020-01-25 ENCOUNTER — Ambulatory Visit (HOSPITAL_BASED_OUTPATIENT_CLINIC_OR_DEPARTMENT_OTHER): Payer: No Typology Code available for payment source

## 2020-01-25 DIAGNOSIS — O09523 Supervision of elderly multigravida, third trimester: Secondary | ICD-10-CM | POA: Diagnosis present

## 2020-01-25 DIAGNOSIS — O133 Gestational [pregnancy-induced] hypertension without significant proteinuria, third trimester: Secondary | ICD-10-CM | POA: Insufficient documentation

## 2020-01-25 DIAGNOSIS — O09813 Supervision of pregnancy resulting from assisted reproductive technology, third trimester: Secondary | ICD-10-CM | POA: Diagnosis not present

## 2020-01-25 DIAGNOSIS — E669 Obesity, unspecified: Secondary | ICD-10-CM | POA: Insufficient documentation

## 2020-01-25 DIAGNOSIS — O0990 Supervision of high risk pregnancy, unspecified, unspecified trimester: Secondary | ICD-10-CM

## 2020-01-25 DIAGNOSIS — D259 Leiomyoma of uterus, unspecified: Secondary | ICD-10-CM | POA: Insufficient documentation

## 2020-01-25 DIAGNOSIS — Z3A33 33 weeks gestation of pregnancy: Secondary | ICD-10-CM | POA: Insufficient documentation

## 2020-01-25 DIAGNOSIS — O3413 Maternal care for benign tumor of corpus uteri, third trimester: Secondary | ICD-10-CM | POA: Diagnosis not present

## 2020-01-25 DIAGNOSIS — O24410 Gestational diabetes mellitus in pregnancy, diet controlled: Secondary | ICD-10-CM

## 2020-01-25 DIAGNOSIS — O99213 Obesity complicating pregnancy, third trimester: Secondary | ICD-10-CM | POA: Insufficient documentation

## 2020-01-25 NOTE — Procedures (Signed)
Melissa Medina [id number] [redacted]w[redacted]d  Fetus A Non-Stress Test Interpretation for 01/25/20  Indication: Diabetes and Advanced Maternal Age >40 years  Fetal Heart Rate A Mode: External Baseline Rate (A): 140 bpm Variability: Moderate Accelerations: 15 x 15 Decelerations: None Multiple birth?: No  Uterine Activity Mode: Palpation, Toco Contraction Frequency (min): Irreg Contraction Duration (sec): 30-60 Contraction Quality: Mild Resting Tone Palpated: Relaxed Resting Time: Adequate  Interpretation (Fetal Testing) Nonstress Test Interpretation: Reactive Comments: Dr. Donalee Citrin reviewed tracing.

## 2020-01-25 NOTE — Progress Notes (Signed)
C/o "eyes burning and epigastric pain, denies headache

## 2020-01-28 ENCOUNTER — Inpatient Hospital Stay (HOSPITAL_COMMUNITY)
Admission: AD | Admit: 2020-01-28 | Discharge: 2020-01-28 | Disposition: A | Discharge status: Home / Self Care | Payer: No Typology Code available for payment source | Attending: Obstetrics and Gynecology | Admitting: Obstetrics and Gynecology

## 2020-01-28 ENCOUNTER — Other Ambulatory Visit: Payer: Self-pay

## 2020-01-28 ENCOUNTER — Encounter (HOSPITAL_COMMUNITY): Payer: Self-pay | Admitting: Obstetrics and Gynecology

## 2020-01-28 DIAGNOSIS — Z833 Family history of diabetes mellitus: Secondary | ICD-10-CM | POA: Diagnosis not present

## 2020-01-28 DIAGNOSIS — Z3A33 33 weeks gestation of pregnancy: Secondary | ICD-10-CM | POA: Insufficient documentation

## 2020-01-28 DIAGNOSIS — Z79899 Other long term (current) drug therapy: Secondary | ICD-10-CM | POA: Insufficient documentation

## 2020-01-28 DIAGNOSIS — O24410 Gestational diabetes mellitus in pregnancy, diet controlled: Secondary | ICD-10-CM | POA: Diagnosis not present

## 2020-01-28 DIAGNOSIS — O26893 Other specified pregnancy related conditions, third trimester: Secondary | ICD-10-CM | POA: Diagnosis present

## 2020-01-28 DIAGNOSIS — Z7982 Long term (current) use of aspirin: Secondary | ICD-10-CM | POA: Diagnosis not present

## 2020-01-28 DIAGNOSIS — Z8249 Family history of ischemic heart disease and other diseases of the circulatory system: Secondary | ICD-10-CM | POA: Diagnosis not present

## 2020-01-28 DIAGNOSIS — O133 Gestational [pregnancy-induced] hypertension without significant proteinuria, third trimester: Secondary | ICD-10-CM | POA: Insufficient documentation

## 2020-01-28 HISTORY — DX: Gastro-esophageal reflux disease without esophagitis: K21.9

## 2020-01-28 LAB — CBC
HCT: 33.1 % — ABNORMAL LOW (ref 36.0–46.0)
Hemoglobin: 10.8 g/dL — ABNORMAL LOW (ref 12.0–15.0)
MCH: 28.3 pg (ref 26.0–34.0)
MCHC: 32.6 g/dL (ref 30.0–36.0)
MCV: 86.6 fL (ref 80.0–100.0)
Platelets: 222 10*3/uL (ref 150–400)
RBC: 3.82 MIL/uL — ABNORMAL LOW (ref 3.87–5.11)
RDW: 14.8 % (ref 11.5–15.5)
WBC: 7.1 10*3/uL (ref 4.0–10.5)
nRBC: 0 % (ref 0.0–0.2)

## 2020-01-28 LAB — URINALYSIS, ROUTINE W REFLEX MICROSCOPIC
Bilirubin Urine: NEGATIVE
Glucose, UA: NEGATIVE mg/dL
Hgb urine dipstick: NEGATIVE
Ketones, ur: 20 mg/dL — AB
Leukocytes,Ua: NEGATIVE
Nitrite: NEGATIVE
Protein, ur: 30 mg/dL — AB
Specific Gravity, Urine: 1.024 (ref 1.005–1.030)
pH: 5 (ref 5.0–8.0)

## 2020-01-28 LAB — COMPREHENSIVE METABOLIC PANEL
ALT: 30 U/L (ref 0–44)
AST: 26 U/L (ref 15–41)
Albumin: 2.4 g/dL — ABNORMAL LOW (ref 3.5–5.0)
Alkaline Phosphatase: 129 U/L — ABNORMAL HIGH (ref 38–126)
Anion gap: 10 (ref 5–15)
BUN: 10 mg/dL (ref 6–20)
CO2: 21 mmol/L — ABNORMAL LOW (ref 22–32)
Calcium: 8.7 mg/dL — ABNORMAL LOW (ref 8.9–10.3)
Chloride: 105 mmol/L (ref 98–111)
Creatinine, Ser: 0.68 mg/dL (ref 0.44–1.00)
GFR, Estimated: >60 mL/min (ref >60)
Glucose, Bld: 151 mg/dL — ABNORMAL HIGH (ref 70–99)
Potassium: 3.8 mmol/L (ref 3.5–5.1)
Sodium: 136 mmol/L (ref 135–145)
Total Bilirubin: 0.5 mg/dL (ref 0.3–1.2)
Total Protein: 5.6 g/dL — ABNORMAL LOW (ref 6.5–8.1)

## 2020-01-28 LAB — PROTEIN / CREATININE RATIO, URINE
Creatinine, Urine: 214.05 mg/dL
Protein Creatinine Ratio: 0.09 mg/mg{Cre} (ref 0.00–0.15)
Total Protein, Urine: 20 mg/dL

## 2020-01-28 NOTE — MAU Note (Signed)
Pt reports headache remains mild-"I dont feel like I need Tylenol for it". Denies visual changes or epigastric pain. Reports BP at home 147/94 and 151/102.

## 2020-01-28 NOTE — MAU Note (Signed)
Checking BP as instructed.  Has light HA to day, has not taken anything today for it.  Denies visual changes, epigastric pain, hands are a little swollen.  No bleeding or LOF.  Instructed to come in for further eval.

## 2020-01-28 NOTE — MAU Note (Signed)
Lab called to confirm enough urine remained to add protein creatinine ration-lab tech to call if new sample needs to be collected

## 2020-01-28 NOTE — MAU Note (Signed)
Not enough urine for culture.  

## 2020-01-28 NOTE — MAU Provider Note (Addendum)
Chief Complaint:  Hypertension and Headache   HPI: Melissa Medina is a 49 y.o. [name] at 67w5dwho presents to maternity admissions reporting home blood pressure reading of 151/102 at 11:30 this morning. She says that she has felt off for the past two weeks with epigastric pain, as well as intermittent blurry vision and floaters. She reports a headache last night and took some Tylenol PM. She says that this morning her headache is worse than it has been in the past (but is only mild now and does not require medication). She has noticed peripheral swelling and has significant pain in her hands. She has a pmh of fibromyalgia but reports this pain feels different.  She has been diagnosed with gestational diabetes and has been trying diet control, however she reports not knowing how to use her monitor to check her blood glucose. Denies vaginal bleeding, leaking of fluid, decreased fetal movement, fever, falls, or recent illness. Pt has a history of several MAU visits over the course of this pregnancy (8x including this visit).  Past Medical History:  Diagnosis Date  . Anemia   . Anxiety   . Bilateral plantar fasciitis   . Chronic headaches   . Fibroid   . Fibromyalgia   . GERD (gastroesophageal reflux disease)   . Gestational diabetes   . Herniated disc, cervical   . Migraine   . Patellofemoral syndrome of left knee   . Patellofemoral syndrome of right knee   . PTSD (post-traumatic stress disorder)   . Sleep apnea    OB History  Gravida Para Term Preterm AB Living  _0 SAB TAB Ectopic Multiple Live Births          2    # Outcome Date GA Lbr Len/2nd Weight Sex Delivery Anes PTL Lv  3 Current           2 Term 1993   3402 g  Vag-Spont   LIV  1 Term 1990   3402 g  Vag-Spont   LIV   Past Surgical History:  Procedure Laterality Date  . APPENDECTOMY     childhood  . BREAST REDUCTION SURGERY  2009  . CHOLECYSTECTOMY    . DILATION AND CURETTAGE OF UTERUS    . LIPOSUCTION MULTIPLE  BODY PARTS    . TONSILLECTOMY     childhood   Family History  Problem Relation Age of Onset  . Hypertension Mother   . Diabetes Mother   . Kidney failure Mother   . Healthy Father   . Aneurysm Father        brain   Social History   Tobacco Use  . Smoking status: Never Smoker  . Smokeless tobacco: Never Used  Vaping Use  . Vaping Use: Never used  Substance Use Topics  . Alcohol use: No  . Drug use: No   No Known Allergies Medications Prior to Admission  Medication Sig Dispense Refill Last Dose  . acetaminophen (TYLENOL) 325 MG tablet Take 650 mg by mouth every 6 (six) hours as needed.   01/27/2020 at Unknown time  . aspirin EC 81 MG tablet Take 1 tablet (81 mg total) by mouth daily. Take after 12 weeks for prevention of preeclampsia later in pregnancy 300 tablet 2 01/27/2020 at Unknown time  . Calcium Carbonate-Vit D-Min (CALCIUM 1200 PO) Take by mouth.   01/27/2020 at Unknown time  . ferrous sulfate 325 (65 FE) MG EC tablet Take 325 mg by mouth  every other day.   01/27/2020 at Unknown time  . pantoprazole (PROTONIX) 40 MG tablet Take 1 tablet (40 mg total) by mouth daily. 30 tablet 3 01/27/2020 at Unknown time  . Prenatal Vit-Fe Fumarate-FA (PRENATAL MULTIVITAMIN) TABS tablet Take 1 tablet by mouth daily at 12 noon.   01/27/2020 at Unknown time  . vitamin B-12 (CYANOCOBALAMIN) 500 MCG tablet Take 1,000 mcg by mouth daily.   01/27/2020 at Unknown time  . vitamin C (ASCORBIC ACID) 250 MG tablet Take 250 mg by mouth every other day.    01/27/2020 at Unknown time  . glucose blood (FREESTYLE TEST STRIPS) test strip 1 each by Other route in the morning, at noon, in the evening, and at bedtime. 100 strip 2   . glucose monitoring kit (FREESTYLE) monitoring kit 1 each by Does not apply route as needed for other. 1 each 0   . Lancets (FREESTYLE) lancets Use to check blood sugars 4 times per day (fasting & 2 hours after each meal) (Patient not taking: Reported on 01/20/2020) 120 each 1     I  have reviewed patient's Past Medical Hx, Surgical Hx, Family Hx, Social Hx, medications and allergies.   ROS:  Review of Systems  Constitutional: Negative for chills and fever.  Eyes: Positive for visual disturbance. Negative for photophobia.  Cardiovascular: Positive for leg swelling.  Gastrointestinal: Positive for abdominal pain and nausea. Negative for vomiting.  Genitourinary: Positive for decreased urine volume and frequency. Negative for difficulty urinating, dysuria and pelvic pain.  Neurological: Positive for dizziness and light-headedness. Negative for syncope.  Hematological: Does not bruise/bleed easily.  Psychiatric/Behavioral: Positive for sleep disturbance. The patient is nervous/anxious.    Physical Exam   Patient Vitals for the past 24 hrs:  BP Temp Temp src Pulse Resp SpO2 Height Weight  01/28/20 1749 107/74 98.1 F (36.7 C) Oral 87 18 99 % -- --  01/28/20 1720 -- -- -- -- -- 99 % -- --  01/28/20 1715 -- -- -- -- -- 100 % -- --  01/28/20 1710 -- -- -- -- -- 100 % -- --  01/28/20 1705 -- -- -- -- -- 100 % -- --  01/28/20 1700 -- -- -- -- -- 99 % -- --  01/28/20 1640 -- -- -- -- -- 98 % -- --  01/28/20 1635 -- -- -- -- -- 98 % -- --  01/28/20 1630 131/81 -- -- 88 -- 99 % -- --  01/28/20 1625 -- -- -- -- -- 99 % -- --  01/28/20 1620 -- -- -- -- -- 99 % -- --  01/28/20 1615 133/80 -- -- 89 -- 99 % -- --  01/28/20 1610 -- -- -- -- -- 98 % -- --  01/28/20 1605 -- -- -- -- -- 99 % -- --  01/28/20 1600 134/84 -- -- 92 -- 98 % -- --  01/28/20 1555 -- -- -- -- -- 99 % -- --  01/28/20 1550 -- -- -- -- -- 100 % -- --  01/28/20 1545 135/90 -- -- 97 17 99 % -- --  01/28/20 1540 -- -- -- -- -- 99 % -- --  01/28/20 1535 -- -- -- -- -- 100 % -- --  01/28/20 1530 134/87 -- -- 100 -- 100 % -- --  01/28/20 1525 -- -- -- -- -- 100 % -- --  01/28/20 1520 -- -- -- -- -- 99 % -- --  01/28/20 1515 (!) 148/90 -- -- 100 -- 100 % -- --  01/28/20 1510 -- -- -- -- -- 100 % -- --   01/28/20 1505 -- -- -- -- -- 100 % -- --  01/28/20 1500 (!) 148/98 -- -- (!) 103 -- 99 % -- --  01/28/20 1445 131/89 -- -- (!) 105 -- 99 % -- --  01/28/20 1440 -- -- -- -- -- 99 % -- --  01/28/20 1435 -- -- -- -- -- 98 % -- --  01/28/20 1434 (!) 148/91 98.5 F (36.9 C) Oral (!) 103 18 -- -- --  01/28/20 1430 -- -- -- -- -- 100 % -- --  01/28/20 1414 -- -- -- -- -- -- 5' 5" (1.651 m) 106.8 kg    Constitutional: Well-developed, well-nourished female in no acute distress.  Cardiovascular: normal rate & rhythm, no murmur Respiratory: normal effort, lung sounds clear throughout GI: Abd soft, non-tender, gravid appropriate for gestational age.  MS: Extremities nontender, mild non-pitting edema in both feet/ankles, normal ROM Neurologic: Alert and oriented x 4.  Pelvic exam deferred  Labs: Results for orders placed or performed during the hospital encounter of 01/28/20 (from the past 24 hour(s))  Urinalysis, Routine w reflex microscopic Urine, Clean Catch     Status: Abnormal   Collection Time: 01/28/20  3:07 PM  Result Value Ref Range   Color, Urine AMBER (A) YELLOW   APPearance HAZY (A) CLEAR   Specific Gravity, Urine 1.024 1.005 - 1.030   pH 5.0 5.0 - 8.0   Glucose, UA NEGATIVE NEGATIVE mg/dL   Hgb urine dipstick NEGATIVE NEGATIVE   Bilirubin Urine NEGATIVE NEGATIVE   Ketones, ur 20 (A) NEGATIVE mg/dL   Protein, ur 30 (A) NEGATIVE mg/dL   Nitrite NEGATIVE NEGATIVE   Leukocytes,Ua NEGATIVE NEGATIVE   RBC / HPF 0-5 0 - 5 RBC/hpf   WBC, UA 0-5 0 - 5 WBC/hpf   Bacteria, UA RARE (A) NONE SEEN   Squamous Epithelial / LPF 21-50 0 - 5   Mucus PRESENT   Protein / creatinine ratio, urine     Status: None   Collection Time: 01/28/20  3:07 PM  Result Value Ref Range   Creatinine, Urine 214.05 mg/dL   Total Protein, Urine 20 mg/dL   Protein Creatinine Ratio 0.09 0.00 - 0.15 mg/mg[Cre]  CBC     Status: Abnormal   Collection Time: 01/28/20  3:50 PM  Result Value Ref Range   WBC 7.1  4.0 - 10.5 K/uL   RBC 3.82 (L) 3.87 - 5.11 MIL/uL   Hemoglobin 10.8 (L) 12.0 - 15.0 g/dL   HCT 33.1 (L) 36 - 46 %   MCV 86.6 80.0 - 100.0 fL   MCH 28.3 26.0 - 34.0 pg   MCHC 32.6 30.0 - 36.0 g/dL   RDW 14.8 11.5 - 15.5 %   Platelets 222 150 - 400 K/uL   nRBC 0.0 0.0 - 0.2 %  Comprehensive metabolic panel     Status: Abnormal   Collection Time: 01/28/20  3:50 PM  Result Value Ref Range   Sodium 136 135 - 145 mmol/L   Potassium 3.8 3.5 - 5.1 mmol/L   Chloride 105 98 - 111 mmol/L   CO2 21 (L) 22 - 32 mmol/L   Glucose, Bld 151 (H) 70 - 99 mg/dL   BUN 10 6 - 20 mg/dL   Creatinine, Ser 0.68 0.44 - 1.00 mg/dL   Calcium 8.7 (L) 8.9 - 10.3 mg/dL   Total Protein 5.6 (L) 6.5 - 8.1 g/dL   Albumin 2.4 (L) 3.5 -   5.0 g/dL   AST 26 15 - 41 U/L   ALT 30 0 - 44 U/L   Alkaline Phosphatase 129 (H) 38 - 126 U/L   Total Bilirubin 0.5 0.3 - 1.2 mg/dL   GFR, Estimated >60 >60 mL/min   Anion gap 10 5 - 15    Imaging:  No results found.  MAU Course: Orders Placed This Encounter  Procedures  . Urinalysis, Routine w reflex microscopic Urine, Clean Catch  . CBC  . Comprehensive metabolic panel  . Protein / creatinine ratio, urine  . Discharge patient   MDM: Preeclampsia labs normal (but platelets lower, liver enzymes higher, and P:Cr higher than previous values although still within normal limits)  Blood pressure stabilized during time in MAU - clarified headaches she's been experiencing and throbbing along with her HR and worst when she is standing too long or busy all day. Only mild frontal headache at the moment.   Elevated blood glucose but has not been checking glucose bc she doesn't know how to use her glucometer. Pt brought glucometer so I demonstrated how to load the lancet, obtain a blood sample and get a reading. Pt verbalized understanding and ability to replicate it on her own at home. Provided education at length about importance of blood sugar control for prevention of further BP  elevation and preeclampsia development. Explained how blood sugar changes the body's ability to effectively pump blood and perfuse organs. Pt verbalized understanding and expressed anxiety about delivery dates and method of delivery. Again, emphasized importance of blood sugar testing/control and BP monitoring in order to successfully carry pregnancy to term and have baby vaginally. Pt appreciative of education and said she would begin testing/closer diet control.  Assessment: 1. Gestational hypertension without significant proteinuria in third trimester   2. Diet controlled gestational diabetes mellitus (GDM) in third trimester   3. Gestational hypertension, third trimester     Plan: Discharge home in stable condition with return precautions Follow up BP check at weekly BPP on Monday, 01/30/20   Follow-up Information    [facility] Call on 01/31/2020.   Specialty: Obstetrics and Gynecology Why: Call to schedule an appt for a BP check on Monday Contact information: [address] [place] [address] [phone]              Allergies as of 01/28/2020   No Known Allergies     Medication List    TAKE these medications   acetaminophen 325 MG tablet Commonly known as: TYLENOL Take 650 mg by mouth every 6 (six) hours as needed.   aspirin EC 81 MG tablet Take 1 tablet (81 mg total) by mouth daily. Take after 12 weeks for prevention of preeclampsia later in pregnancy   CALCIUM 1200 PO Take by mouth.   ferrous sulfate 325 (65 FE) MG EC tablet Take 325 mg by mouth every other day.   freestyle lancets Use to check blood sugars 4 times per day (fasting & 2 hours after each meal)   FREESTYLE TEST STRIPS test strip Generic drug: glucose blood 1 each by Other route in the morning, at noon, in the evening, and at bedtime.   glucose monitoring kit monitoring kit 1 each by Does not apply route as needed for  other.   pantoprazole 40 MG tablet Commonly known as: Protonix Take 1 tablet (40 mg total) by mouth daily.   prenatal multivitamin Tabs tablet Take 1 tablet by mouth daily   at 12 noon.   vitamin B-12 500 MCG tablet Commonly known as: CYANOCOBALAMIN Take 1,000 mcg by mouth daily.   vitamin C 250 MG tablet Commonly known as: ASCORBIC ACID Take 250 mg by mouth every other day.      Gaylan Gerold, CNM, MSN, [facility] 01/28/20 9:49 PM

## 2020-01-28 NOTE — Discharge Instructions (Signed)
Diabetes Mellitus and Nutrition, Adult When you have diabetes (diabetes mellitus), it is very important to have healthy eating habits because your blood sugar (glucose) levels are greatly affected by what you eat and drink. Eating healthy foods in the appropriate amounts, at about the same times every day, can help you:  Control your blood glucose.  Lower your risk of heart disease.  Improve your blood pressure.  Reach or maintain a healthy weight. Every person with diabetes is different, and each person has different needs for a meal plan. Your health care provider may recommend that you work with a diet and nutrition specialist (dietitian) to make a meal plan that is best for you. Your meal plan may vary depending on factors such as:  The calories you need.  The medicines you take.  Your weight.  Your blood glucose, blood pressure, and cholesterol levels.  Your activity level.  Other health conditions you have, such as heart or kidney disease. How do carbohydrates affect me? Carbohydrates, also called carbs, affect your blood glucose level more than any other type of food. Eating carbs naturally raises the amount of glucose in your blood. Carb counting is a method for keeping track of how many carbs you eat. Counting carbs is important to keep your blood glucose at a healthy level, especially if you use insulin or take certain oral diabetes medicines. It is important to know how many carbs you can safely have in each meal. This is different for every person. Your dietitian can help you calculate how many carbs you should have at each meal and for each snack. Foods that contain carbs include:  Bread, cereal, rice, pasta, and crackers.  Potatoes and corn.  Peas, beans, and lentils.  Milk and yogurt.  Fruit and juice.  Desserts, such as cakes, cookies, ice cream, and candy. How does alcohol affect me? Alcohol can cause a sudden decrease in blood glucose (hypoglycemia),  especially if you use insulin or take certain oral diabetes medicines. Hypoglycemia can be a life-threatening condition. Symptoms of hypoglycemia (sleepiness, dizziness, and confusion) are similar to symptoms of having too much alcohol. If your health care provider says that alcohol is safe for you, follow these guidelines:  Limit alcohol intake to no more than 1 drink per day for nonpregnant women and 2 drinks per day for men. One drink equals 12 oz of beer, 5 oz of wine, or 1 oz of hard liquor.  Do not drink on an empty stomach.  Keep yourself hydrated with water, diet soda, or unsweetened iced tea.  Keep in mind that regular soda, juice, and other mixers may contain a lot of sugar and must be counted as carbs. What are tips for following this plan?  Reading food labels  Start by checking the serving size on the "Nutrition Facts" label of packaged foods and drinks. The amount of calories, carbs, fats, and other nutrients listed on the label is based on one serving of the item. Many items contain more than one serving per package.  Check the total grams (g) of carbs in one serving. You can calculate the number of servings of carbs in one serving by dividing the total carbs by 15. For example, if a food has 30 g of total carbs, it would be equal to 2 servings of carbs.  Check the number of grams (g) of saturated and trans fats in one serving. Choose foods that have low or no amount of these fats.  Check the number of   milligrams (mg) of salt (sodium) in one serving. Most people should limit total sodium intake to less than 2,300 mg per day.  Always check the nutrition information of foods labeled as "low-fat" or "nonfat". These foods may be higher in added sugar or refined carbs and should be avoided.  Talk to your dietitian to identify your daily goals for nutrients listed on the label. Shopping  Avoid buying canned, premade, or processed foods. These foods tend to be high in fat, sodium,  and added sugar.  Shop around the outside edge of the grocery store. This includes fresh fruits and vegetables, bulk grains, fresh meats, and fresh dairy. Cooking  Use low-heat cooking methods, such as baking, instead of high-heat cooking methods like deep frying.  Cook using healthy oils, such as olive, canola, or sunflower oil.  Avoid cooking with butter, cream, or high-fat meats. Meal planning  Eat meals and snacks regularly, preferably at the same times every day. Avoid going long periods of time without eating.  Eat foods high in fiber, such as fresh fruits, vegetables, beans, and whole grains. Talk to your dietitian about how many servings of carbs you can eat at each meal.  Eat 4-6 ounces (oz) of lean protein each day, such as lean meat, chicken, fish, eggs, or tofu. One oz of lean protein is equal to: ? 1 oz of meat, chicken, or fish. ? 1 egg. ?  cup of tofu.  Eat some foods each day that contain healthy fats, such as avocado, nuts, seeds, and fish. Lifestyle  Check your blood glucose regularly.  Exercise regularly as told by your health care provider. This may include: ? 150 minutes of moderate-intensity or vigorous-intensity exercise each week. This could be brisk walking, biking, or water aerobics. ? Stretching and doing strength exercises, such as yoga or weightlifting, at least 2 times a week.  Take medicines as told by your health care provider.  Do not use any products that contain nicotine or tobacco, such as cigarettes and e-cigarettes. If you need help quitting, ask your health care provider.  Work with a Social worker or diabetes educator to identify strategies to manage stress and any emotional and social challenges. Questions to ask a health care provider  Do I need to meet with a diabetes educator?  Do I need to meet with a dietitian?  What number can I call if I have questions?  When are the best times to check my blood glucose? Where to find more  information:  American Diabetes Association: diabetes.org  Academy of Nutrition and Dietetics: www.eatright.[employer] of Diabetes and Digestive and Kidney Diseases (NIH): [employer] Summary  A healthy meal plan will help you control your blood glucose and maintain a healthy lifestyle.  Working with a diet and nutrition specialist (dietitian) can help you make a meal plan that is best for you.  Keep in mind that carbohydrates (carbs) and alcohol have immediate effects on your blood glucose levels. It is important to count carbs and to use alcohol carefully. This information is not intended to replace advice given to you by your health care provider. Make sure you discuss any questions you have with your health care provider. Document Revised: 02/21/2017 Document Reviewed: 04/15/2016 Elsevier Patient Education  2020 [employer].  Hypertension During Pregnancy Hypertension is also called high blood pressure. High blood pressure means that the force of your blood moving in your body is too strong. It can cause problems for you and your baby. Different types  of high blood pressure can happen during pregnancy. The types are:  High blood pressure before you got pregnant. This is called chronic hypertension.  This can continue during your pregnancy. Your doctor will want to keep checking your blood pressure. You may need medicine to keep your blood pressure under control while you are pregnant. You will need follow-up visits after you have your baby.  High blood pressure that goes up during pregnancy when it was normal before. This is called gestational hypertension. It will usually get better after you have your baby, but your doctor will need to watch your blood pressure to make sure that it is getting better.  Very high blood pressure during pregnancy. This is called preeclampsia. Very high blood pressure is an emergency that needs to be checked and treated right  away.  You may develop very high blood pressure after giving birth. This is called postpartum preeclampsia. This usually occurs within 48 hours after childbirth but may occur up to 6 weeks after giving birth. This is rare. How does this affect me? If you have high blood pressure during pregnancy, you have a higher chance of developing high blood pressure:  As you get older.  If you get pregnant again. In some cases, high blood pressure during pregnancy can cause:  Stroke.  Heart attack.  Damage to the kidneys, lungs, or liver.  Preeclampsia.  Jerky movements you cannot control (convulsions or seizures).  Problems with the placenta. How does this affect my baby? Your baby may:  Be born early.  Not weigh as much as he or she should.  Not handle labor well, leading to a c-section birth. What are the risks?  Having high blood pressure during a past pregnancy.  Being overweight.  Being 55 years old or older.  Being pregnant for the first time.  Being pregnant with more than one baby.  Becoming pregnant using fertility methods, such as IVF.  Having other problems, such as diabetes, or kidney disease.  Having family members who have high blood pressure. What can I do to lower my risk?   Keep a healthy weight.  Eat a healthy diet.  Follow what your doctor tells you about treating any medical problems that you had before becoming pregnant. It is very important to go to all of your doctor visits. Your doctor will check your blood pressure and make sure that your pregnancy is progressing as it should. Treatment should start early if a problem is found. How is this treated? Treatment for high blood pressure during pregnancy can differ depending on the type of high blood pressure you have and how serious it is.  You may need to take blood pressure medicine.  If you have been taking medicine for your blood pressure, you may need to change the medicine during pregnancy  if it is not safe for your baby.  If your doctor thinks that you could get very high blood pressure, he or she may tell you to take a low-dose aspirin during your pregnancy.  If you have very high blood pressure, you may need to stay in the hospital so you and your baby can be watched closely. You may also need to take medicine to lower your blood pressure. This medicine may be given by mouth or through an IV tube.  In some cases, if your condition gets worse, you may need to have your baby early. Follow these instructions at home: Eating and drinking   Drink enough fluid to keep  your pee (urine) pale yellow.  Avoid caffeine. Lifestyle  Do not use any products that contain nicotine or tobacco, such as cigarettes, e-cigarettes, and chewing tobacco. If you need help quitting, ask your doctor.  Do not use alcohol or drugs.  Avoid stress.  Rest and get plenty of sleep.  Regular exercise can help. Ask your doctor what kinds of exercise are best for you. General instructions  Take over-the-counter and prescription medicines only as told by your doctor.  Keep all prenatal and follow-up visits as told by your doctor. This is important. Contact a doctor if:  You have symptoms that your doctor told you to watch for, such as: ? Headaches. ? Nausea. ? Vomiting. ? Belly (abdominal) pain. ? Dizziness. ? Light-headedness. Get help right away if:  You have: ? Very bad belly pain that does not get better with treatment. ? A very bad headache that does not get better. ? Vomiting that does not get better. ? Sudden, fast weight gain. ? Sudden swelling in your hands, ankles, or face. ? Bleeding from your vagina. ? Blood in your pee. ? Blurry vision. ? Double vision. ? Shortness of breath. ? Chest pain. ? Weakness on one side of your body. ? Trouble talking.  Your baby is not moving as much as usual. Summary  High blood pressure is also called hypertension.  High blood pressure  means that the force of your blood moving in your body is too strong.  High blood pressure can cause problems for you and your baby.  Keep all follow-up visits as told by your doctor. This is important. This information is not intended to replace advice given to you by your health care provider. Make sure you discuss any questions you have with your health care provider. Document Revised: 07/02/2018 Document Reviewed: 04/07/2018 Elsevier Patient Education  [place].

## 2020-01-31 ENCOUNTER — Ambulatory Visit: Payer: No Typology Code available for payment source | Admitting: *Deleted

## 2020-01-31 ENCOUNTER — Encounter: Payer: Self-pay | Admitting: *Deleted

## 2020-01-31 ENCOUNTER — Other Ambulatory Visit: Payer: Self-pay

## 2020-01-31 ENCOUNTER — Ambulatory Visit: Payer: No Typology Code available for payment source | Attending: Obstetrics and Gynecology

## 2020-01-31 DIAGNOSIS — O09523 Supervision of elderly multigravida, third trimester: Secondary | ICD-10-CM | POA: Diagnosis not present

## 2020-01-31 DIAGNOSIS — D259 Leiomyoma of uterus, unspecified: Secondary | ICD-10-CM

## 2020-01-31 DIAGNOSIS — O133 Gestational [pregnancy-induced] hypertension without significant proteinuria, third trimester: Secondary | ICD-10-CM | POA: Diagnosis not present

## 2020-01-31 DIAGNOSIS — O99213 Obesity complicating pregnancy, third trimester: Secondary | ICD-10-CM | POA: Diagnosis not present

## 2020-01-31 DIAGNOSIS — O0990 Supervision of high risk pregnancy, unspecified, unspecified trimester: Secondary | ICD-10-CM

## 2020-01-31 DIAGNOSIS — Z3A34 34 weeks gestation of pregnancy: Secondary | ICD-10-CM

## 2020-01-31 DIAGNOSIS — O09813 Supervision of pregnancy resulting from assisted reproductive technology, third trimester: Secondary | ICD-10-CM

## 2020-01-31 DIAGNOSIS — Z362 Encounter for other antenatal screening follow-up: Secondary | ICD-10-CM

## 2020-01-31 DIAGNOSIS — O3413 Maternal care for benign tumor of corpus uteri, third trimester: Secondary | ICD-10-CM

## 2020-01-31 DIAGNOSIS — E669 Obesity, unspecified: Secondary | ICD-10-CM

## 2020-01-31 NOTE — Progress Notes (Signed)
Was seen in MAU on 01/28/20 for elevated BP.

## 2020-02-03 ENCOUNTER — Telehealth: Payer: Self-pay | Admitting: Clinical

## 2020-02-03 ENCOUNTER — Ambulatory Visit (INDEPENDENT_AMBULATORY_CARE_PROVIDER_SITE_OTHER): Payer: No Typology Code available for payment source | Admitting: Obstetrics & Gynecology

## 2020-02-03 ENCOUNTER — Other Ambulatory Visit: Payer: Self-pay

## 2020-02-03 ENCOUNTER — Encounter: Payer: Self-pay | Admitting: Obstetrics & Gynecology

## 2020-02-03 VITALS — BP 139/93 | HR 86 | Wt 236.4 lb

## 2020-02-03 DIAGNOSIS — Z3A34 34 weeks gestation of pregnancy: Secondary | ICD-10-CM

## 2020-02-03 DIAGNOSIS — O24415 Gestational diabetes mellitus in pregnancy, controlled by oral hypoglycemic drugs: Secondary | ICD-10-CM

## 2020-02-03 DIAGNOSIS — O133 Gestational [pregnancy-induced] hypertension without significant proteinuria, third trimester: Secondary | ICD-10-CM

## 2020-02-03 DIAGNOSIS — O0990 Supervision of high risk pregnancy, unspecified, unspecified trimester: Secondary | ICD-10-CM

## 2020-02-03 MED ORDER — METFORMIN HCL 500 MG PO TABS: 500.0000 mg | ORAL_TABLET | Freq: Two times a day (BID) | ORAL | 5 refills | Status: DC

## 2020-02-03 NOTE — Progress Notes (Signed)
Blood pressures recorded by patient at home:   02/02/20 @ 1441: 136/92  02/03/19 @ 0750: 140/98 and @ 0903: 142/91  Additional values sent via MyChart.   Apolonio Schneiders RN 02/03/20

## 2020-02-03 NOTE — Telephone Encounter (Signed)
Left HIPPA-compliant message to call back [name] from [employer] for [employer] at [facility] at [phone] [facility] office). Attempt to f/u after positive depression screen today.

## 2020-02-03 NOTE — Patient Instructions (Signed)
Return to office for any scheduled appointments. Call the office or go to the MAU at [facility] if:  You begin to have strong, frequent contractions  Your water breaks.  Sometimes it is a big gush of fluid, sometimes it is just a trickle that keeps getting your panties wet or running down your legs  You have vaginal bleeding.  It is normal to have a small amount of spotting if your cervix was checked.   You do not feel your baby moving like normal.  If you do not, get something to eat and drink and lay down and focus on feeling your baby move.   If your baby is still not moving like normal, you should call the office or go to MAU.  Any other obstetric concerns.   

## 2020-02-03 NOTE — Progress Notes (Signed)
PRENATAL VISIT NOTE  Subjective:  Melissa Medina is a 49 y.o. [name] at [redacted]w[redacted]d being seen today for ongoing prenatal care.  She is currently monitored for the following issues for this high-risk pregnancy and has Supervision of high risk pregnancy, antepartum; AMA (advanced maternal age) multigravida 36+; Acute nonintractable headache; Bilateral groin pain; Pregnancy resulting from in vitro fertilization; Group B streptococcal bacteriuria; Obesity; Abnormal uterine bleeding; Anxiety; Bilateral fibrocystic breast changes; Cough; Depression; Pain syndrome, chronic; Fibromyalgia; Chronic low back pain; Gastroesophageal reflux disease; History of cervical dysplasia; History of reduction mammoplasty; Iron deficiency anemia due to chronic blood loss; Menometrorrhagia; Pyelonephritis; Restless legs syndrome; Sleep apnea; Uterine fibroid; Vitamin D deficiency; Migraine without aura and responsive to treatment; Posttraumatic stress disorder; Gestational diabetes; and Gestational hypertension on their problem list.  Patient reports multiple complaints. Had mild headaches off and on, body aches, palpitations sometimes, edema. She is very frustrated with having multiple providers at [facility] and [facility]FM, and hearing different things all the time.   Contractions: Irritability. Vag. Bleeding: None.  Movement: Present. Denies leaking of fluid.   The following portions of the patient's history were reviewed and updated as appropriate: allergies, current medications, past family history, past medical history, past social history, past surgical history and problem list.   Objective:   Vitals:   02/03/20 1000  BP: (!) 139/93  Pulse: 86  Weight: 236 lb 6.4 oz (107.2 kg)    Fetal Status: Fetal Heart Rate (bpm): 145   Movement: Present     General:  Alert, oriented and cooperative. Patient is in no acute distress.  Skin: Skin is warm and dry. No rash noted.   Cardiovascular: Normal heart rate noted  Respiratory: Normal  respiratory effort, no problems with respiration noted  Abdomen: Soft, gravid, appropriate for gestational age.  Pain/Pressure: Present     Pelvic: Cervical exam deferred        Extremities: Normal range of motion.  Edema: Moderate pitting, indentation subsides rapidly  Mental Status: Normal mood and affect. Normal behavior. Normal judgment and thought content.   Assessment and Plan:  Pregnancy: [name] at [redacted]w[redacted]d 1. Gestational hypertension without significant proteinuria in third trimester Mild range BP, no current severe features. Explained concern about her headaches being a severe feature, also discussed other severe features that will necessitate delivery. For now, 37 weeks is the goal for delivery unless severe features occur.  Normal labs last week, will re-check this week. Continue weekly antenatal testing and scans as per MFM. - CBC - Comprehensive metabolic panel - Protein / creatinine ratio, urine  2. Gestational diabetes mellitus (GDM) in third trimester controlled on oral hypoglycemic drug Mostly elevated CBGs diffusely, counseled about medication.  She desires Metformin, will continue diet and exercise control. Continue antenatal testing.  - metFORMIN (GLUCOPHAGE) 500 MG tablet; Take 1 tablet (500 mg total) by mouth 2 (two) times daily with a meal.  Dispense: 60 tablet; Refill: 5  3. [redacted] weeks gestation of pregnancy 4. Supervision of high risk pregnancy, antepartum Reassured about her other symptoms and concerns.  Preterm labor symptoms and general obstetric precautions including but not limited to vaginal bleeding, contractions, leaking of fluid and fetal movement were reviewed in detail with the patient. Please refer to After Visit Summary for other counseling recommendations.   Return in about 1 week (around 02/10/2020) for OFFICE HOB VISIT (MD only), CBG review.  Future Appointments  Date Time Provider [place]  02/07/2020 11:15 AM [facility] NURSE [facility]   02/07/2020 11:30 AM  [facility] US3 [facility]  02/14/2020 12:30 PM [facility] NURSE [facility]  02/14/2020 12:45 PM [facility] US5 [facility] [place]    Verita Schneiders, MD

## 2020-02-04 LAB — CBC
Hematocrit: 34 % (ref 34.0–46.6)
Hemoglobin: 11.5 g/dL (ref 11.1–15.9)
MCH: 29 pg (ref 26.6–33.0)
MCHC: 33.8 g/dL (ref 31.5–35.7)
MCV: 86 fL (ref 79–97)
Platelets: 236 10*3/uL (ref 150–450)
RBC: 3.97 x10E6/uL (ref 3.77–5.28)
RDW: 14.1 % (ref 11.7–15.4)
WBC: 7.6 10*3/uL (ref 3.4–10.8)

## 2020-02-04 LAB — COMPREHENSIVE METABOLIC PANEL
ALT: 45 IU/L — ABNORMAL HIGH (ref 0–32)
AST: 29 IU/L (ref 0–40)
Albumin/Globulin Ratio: 1.4 (ref 1.2–2.2)
Albumin: 3.4 g/dL — ABNORMAL LOW (ref 3.8–4.9)
Alkaline Phosphatase: 189 IU/L — ABNORMAL HIGH (ref 44–121)
BUN/Creatinine Ratio: 10 (ref 9–23)
BUN: 6 mg/dL (ref 6–24)
Bilirubin Total: 0.6 mg/dL (ref 0.0–1.2)
CO2: 20 mmol/L (ref 20–29)
Calcium: 9 mg/dL (ref 8.7–10.2)
Chloride: 105 mmol/L (ref 96–106)
Creatinine, Ser: 0.6 mg/dL (ref 0.57–1.00)
GFR calc Af Amer: 122 mL/min/{1.73_m2} (ref >59)
GFR calc non Af Amer: 106 mL/min/{1.73_m2} (ref >59)
Globulin, Total: 2.4 g/dL (ref 1.5–4.5)
Glucose: 78 mg/dL (ref 65–99)
Potassium: 4.4 mmol/L (ref 3.5–5.2)
Sodium: 137 mmol/L (ref 134–144)
Total Protein: 5.8 g/dL — ABNORMAL LOW (ref 6.0–8.5)

## 2020-02-04 LAB — PROTEIN / CREATININE RATIO, URINE
Creatinine, Urine: 179.2 mg/dL
Protein, Ur: 28 mg/dL
Protein/Creat Ratio: 156 mg/g creat (ref 0–200)

## 2020-02-07 ENCOUNTER — Other Ambulatory Visit: Payer: Self-pay | Admitting: *Deleted

## 2020-02-07 ENCOUNTER — Ambulatory Visit: Payer: No Typology Code available for payment source | Attending: Obstetrics and Gynecology

## 2020-02-07 ENCOUNTER — Encounter: Payer: Self-pay | Admitting: *Deleted

## 2020-02-07 ENCOUNTER — Other Ambulatory Visit: Payer: Self-pay

## 2020-02-07 ENCOUNTER — Ambulatory Visit: Payer: No Typology Code available for payment source

## 2020-02-07 ENCOUNTER — Ambulatory Visit: Payer: No Typology Code available for payment source | Admitting: *Deleted

## 2020-02-07 DIAGNOSIS — Z3A35 35 weeks gestation of pregnancy: Secondary | ICD-10-CM

## 2020-02-07 DIAGNOSIS — E669 Obesity, unspecified: Secondary | ICD-10-CM

## 2020-02-07 DIAGNOSIS — O09813 Supervision of pregnancy resulting from assisted reproductive technology, third trimester: Secondary | ICD-10-CM

## 2020-02-07 DIAGNOSIS — O09523 Supervision of elderly multigravida, third trimester: Secondary | ICD-10-CM | POA: Insufficient documentation

## 2020-02-07 DIAGNOSIS — O99213 Obesity complicating pregnancy, third trimester: Secondary | ICD-10-CM | POA: Diagnosis not present

## 2020-02-07 DIAGNOSIS — O133 Gestational [pregnancy-induced] hypertension without significant proteinuria, third trimester: Secondary | ICD-10-CM | POA: Diagnosis not present

## 2020-02-07 DIAGNOSIS — O0990 Supervision of high risk pregnancy, unspecified, unspecified trimester: Secondary | ICD-10-CM | POA: Diagnosis present

## 2020-02-07 DIAGNOSIS — Z362 Encounter for other antenatal screening follow-up: Secondary | ICD-10-CM

## 2020-02-07 DIAGNOSIS — O24419 Gestational diabetes mellitus in pregnancy, unspecified control: Secondary | ICD-10-CM

## 2020-02-07 MED ORDER — BETAMETHASONE SOD PHOS & ACET 6 (3-3) MG/ML IJ SUSP
12.0000 mg | Freq: Once | INTRAMUSCULAR | Status: AC
  Administered 2020-02-07: 12 mg via INTRAMUSCULAR

## 2020-02-07 NOTE — Progress Notes (Signed)
C/o" burning eyes, head feels full/heavy"

## 2020-02-07 NOTE — Progress Notes (Signed)
MFM Note  This patient was seen for a biophysical profile due to very advanced maternal age and gestational hypertension.  Her blood pressures in our office today were 151/83 and 142/77.  The patient complained of some dizziness earlier this morning.  She was worked up for preeclampsia 4 days ago.  Her P/C ratio did not show significant proteinuria.  Her [place] labs showed an AST of 29 and ALT of 45.  She denies any signs or symptoms of severe preeclampsia.    A biophysical profile performed today was 8 out of 8.    There was normal amniotic fluid noted on today's ultrasound exam.  Due to concerns that she may be developing preeclampsia due to her mildly elevated ALT levels, she was given the first dose of antenatal corticosteroids today.  She will return tomorrow to receive the second dose of steroids and for a blood pressure check.  Preeclampsia precautions were reviewed with the patient today.  She was advised to go to the hospital for evaluation should she complain of any signs or symptoms of severe preeclampsia.  Her [place] labs should be repeated again when she returns for her next prenatal visit in 3 days.  I would recommend delivery should her liver function tests continue to increase.  If her labs are within normal limits, I would recommend delivery by 37 weeks.    The patient was given a note for her husband who is currently in the [place] to return as soon as possible so that he can be with her during her delivery.

## 2020-02-08 ENCOUNTER — Ambulatory Visit: Payer: No Typology Code available for payment source | Admitting: *Deleted

## 2020-02-08 ENCOUNTER — Ambulatory Visit (HOSPITAL_BASED_OUTPATIENT_CLINIC_OR_DEPARTMENT_OTHER): Payer: No Typology Code available for payment source | Admitting: *Deleted

## 2020-02-08 ENCOUNTER — Encounter: Payer: Self-pay | Admitting: *Deleted

## 2020-02-08 ENCOUNTER — Ambulatory Visit: Payer: No Typology Code available for payment source | Attending: Obstetrics

## 2020-02-08 DIAGNOSIS — O09523 Supervision of elderly multigravida, third trimester: Secondary | ICD-10-CM | POA: Diagnosis not present

## 2020-02-08 DIAGNOSIS — O368130 Decreased fetal movements, third trimester, not applicable or unspecified: Secondary | ICD-10-CM | POA: Diagnosis not present

## 2020-02-08 DIAGNOSIS — O133 Gestational [pregnancy-induced] hypertension without significant proteinuria, third trimester: Secondary | ICD-10-CM

## 2020-02-08 DIAGNOSIS — Z3A35 35 weeks gestation of pregnancy: Secondary | ICD-10-CM | POA: Insufficient documentation

## 2020-02-08 DIAGNOSIS — O0990 Supervision of high risk pregnancy, unspecified, unspecified trimester: Secondary | ICD-10-CM

## 2020-02-08 MED ORDER — BETAMETHASONE SOD PHOS & ACET 6 (3-3) MG/ML IJ SUSP
12.0000 mg | Freq: Once | INTRAMUSCULAR | Status: AC
Start: 2020-02-08 — End: 2020-02-08
  Administered 2020-02-08: 12 mg via INTRAMUSCULAR

## 2020-02-08 NOTE — Procedures (Signed)
Melissa Medina October 11, 1970 [redacted]w[redacted]d  Fetus A Non-Stress Test Interpretation for 02/08/20  Indication: Decreased Fetal Movement  Fetal Heart Rate A Mode: External Baseline Rate (A): 140 bpm Variability: Moderate Accelerations: 15 x 15 Decelerations: None Multiple birth?: No  Uterine Activity Mode: Toco Contraction Frequency (min): none Resting Tone Palpated: Relaxed Resting Time: Adequate  Interpretation (Fetal Testing) Nonstress Test Interpretation: Reactive Overall Impression: Reassuring for gestational age Comments: Dr. Annamaria Boots reviewed tracing

## 2020-02-08 NOTE — Progress Notes (Signed)
States" H/A this am, lower abd pressure, haven't felt baby move since 12am this morning" Dr. Annamaria Boots to see pt to f/u re elevated BP and H/A yesterday.

## 2020-02-10 ENCOUNTER — Inpatient Hospital Stay (HOSPITAL_BASED_OUTPATIENT_CLINIC_OR_DEPARTMENT_OTHER): Payer: No Typology Code available for payment source

## 2020-02-10 ENCOUNTER — Other Ambulatory Visit: Payer: Self-pay

## 2020-02-10 ENCOUNTER — Encounter (HOSPITAL_COMMUNITY): Payer: Self-pay | Admitting: Obstetrics and Gynecology

## 2020-02-10 ENCOUNTER — Ambulatory Visit (INDEPENDENT_AMBULATORY_CARE_PROVIDER_SITE_OTHER): Payer: No Typology Code available for payment source | Admitting: Obstetrics and Gynecology

## 2020-02-10 ENCOUNTER — Observation Stay (HOSPITAL_COMMUNITY)
Admission: AD | Admit: 2020-02-10 | Discharge: 2020-02-11 | Disposition: A | Discharge status: Home / Self Care | Payer: No Typology Code available for payment source | Attending: Obstetrics and Gynecology | Admitting: Obstetrics and Gynecology

## 2020-02-10 ENCOUNTER — Inpatient Hospital Stay (HOSPITAL_COMMUNITY): Payer: No Typology Code available for payment source

## 2020-02-10 VITALS — BP 152/79 | HR 86 | Wt 239.4 lb

## 2020-02-10 DIAGNOSIS — O368330 Maternal care for abnormalities of the fetal heart rate or rhythm, third trimester, not applicable or unspecified: Principal | ICD-10-CM | POA: Insufficient documentation

## 2020-02-10 DIAGNOSIS — O24419 Gestational diabetes mellitus in pregnancy, unspecified control: Secondary | ICD-10-CM | POA: Diagnosis not present

## 2020-02-10 DIAGNOSIS — O368190 Decreased fetal movements, unspecified trimester, not applicable or unspecified: Secondary | ICD-10-CM

## 2020-02-10 DIAGNOSIS — O09523 Supervision of elderly multigravida, third trimester: Secondary | ICD-10-CM

## 2020-02-10 DIAGNOSIS — O133 Gestational [pregnancy-induced] hypertension without significant proteinuria, third trimester: Secondary | ICD-10-CM

## 2020-02-10 DIAGNOSIS — Z3A35 35 weeks gestation of pregnancy: Secondary | ICD-10-CM

## 2020-02-10 DIAGNOSIS — O99213 Obesity complicating pregnancy, third trimester: Secondary | ICD-10-CM

## 2020-02-10 DIAGNOSIS — Z7982 Long term (current) use of aspirin: Secondary | ICD-10-CM | POA: Insufficient documentation

## 2020-02-10 DIAGNOSIS — O0990 Supervision of high risk pregnancy, unspecified, unspecified trimester: Secondary | ICD-10-CM

## 2020-02-10 DIAGNOSIS — O368130 Decreased fetal movements, third trimester, not applicable or unspecified: Secondary | ICD-10-CM | POA: Diagnosis not present

## 2020-02-10 DIAGNOSIS — O368390 Maternal care for abnormalities of the fetal heart rate or rhythm, unspecified trimester, not applicable or unspecified: Secondary | ICD-10-CM | POA: Diagnosis present

## 2020-02-10 DIAGNOSIS — D259 Leiomyoma of uterus, unspecified: Secondary | ICD-10-CM

## 2020-02-10 DIAGNOSIS — Z362 Encounter for other antenatal screening follow-up: Secondary | ICD-10-CM

## 2020-02-10 DIAGNOSIS — O3413 Maternal care for benign tumor of corpus uteri, third trimester: Secondary | ICD-10-CM

## 2020-02-10 DIAGNOSIS — O139 Gestational [pregnancy-induced] hypertension without significant proteinuria, unspecified trimester: Secondary | ICD-10-CM | POA: Insufficient documentation

## 2020-02-10 DIAGNOSIS — O09813 Supervision of pregnancy resulting from assisted reproductive technology, third trimester: Secondary | ICD-10-CM

## 2020-02-10 DIAGNOSIS — Z6839 Body mass index (BMI) 39.0-39.9, adult: Secondary | ICD-10-CM

## 2020-02-10 DIAGNOSIS — O24415 Gestational diabetes mellitus in pregnancy, controlled by oral hypoglycemic drugs: Secondary | ICD-10-CM

## 2020-02-10 DIAGNOSIS — Z7984 Long term (current) use of oral hypoglycemic drugs: Secondary | ICD-10-CM | POA: Insufficient documentation

## 2020-02-10 DIAGNOSIS — Z79899 Other long term (current) drug therapy: Secondary | ICD-10-CM | POA: Diagnosis not present

## 2020-02-10 DIAGNOSIS — E669 Obesity, unspecified: Secondary | ICD-10-CM

## 2020-02-10 LAB — COMPREHENSIVE METABOLIC PANEL
ALT: 55 U/L — ABNORMAL HIGH (ref 0–44)
AST: 35 U/L (ref 15–41)
Albumin: 2.4 g/dL — ABNORMAL LOW (ref 3.5–5.0)
Alkaline Phosphatase: 142 U/L — ABNORMAL HIGH (ref 38–126)
Anion gap: 9 (ref 5–15)
BUN: 10 mg/dL (ref 6–20)
CO2: 22 mmol/L (ref 22–32)
Calcium: 8.9 mg/dL (ref 8.9–10.3)
Chloride: 107 mmol/L (ref 98–111)
Creatinine, Ser: 0.64 mg/dL (ref 0.44–1.00)
GFR, Estimated: >60 mL/min (ref >60)
Glucose, Bld: 86 mg/dL (ref 70–99)
Potassium: 4.1 mmol/L (ref 3.5–5.1)
Sodium: 138 mmol/L (ref 135–145)
Total Bilirubin: 0.5 mg/dL (ref 0.3–1.2)
Total Protein: 5.5 g/dL — ABNORMAL LOW (ref 6.5–8.1)

## 2020-02-10 LAB — URINALYSIS, ROUTINE W REFLEX MICROSCOPIC
Bilirubin Urine: NEGATIVE
Glucose, UA: NEGATIVE mg/dL
Hgb urine dipstick: NEGATIVE
Ketones, ur: NEGATIVE mg/dL
Leukocytes,Ua: NEGATIVE
Nitrite: NEGATIVE
Protein, ur: 30 mg/dL — AB
Specific Gravity, Urine: 1.032 — ABNORMAL HIGH (ref 1.005–1.030)
pH: 5 (ref 5.0–8.0)

## 2020-02-10 LAB — CBC
HCT: 31.9 % — ABNORMAL LOW (ref 36.0–46.0)
Hemoglobin: 10.4 g/dL — ABNORMAL LOW (ref 12.0–15.0)
MCH: 28.4 pg (ref 26.0–34.0)
MCHC: 32.6 g/dL (ref 30.0–36.0)
MCV: 87.2 fL (ref 80.0–100.0)
Platelets: 228 10*3/uL (ref 150–400)
RBC: 3.66 MIL/uL — ABNORMAL LOW (ref 3.87–5.11)
RDW: 15.1 % (ref 11.5–15.5)
WBC: 8.3 10*3/uL (ref 4.0–10.5)
nRBC: 0 % (ref 0.0–0.2)

## 2020-02-10 LAB — PROTEIN / CREATININE RATIO, URINE
Creatinine, Urine: 228.62 mg/dL
Protein Creatinine Ratio: 0.15 mg/mg{Cre} (ref 0.00–0.15)
Total Protein, Urine: 35 mg/dL

## 2020-02-10 LAB — GLUCOSE, CAPILLARY: Glucose-Capillary: 84 mg/dL (ref 70–99)

## 2020-02-10 MED ORDER — DOCUSATE SODIUM 100 MG PO CAPS: 100.0000 mg | ORAL_CAPSULE | Freq: Every day | ORAL | Status: DC

## 2020-02-10 MED ORDER — CALCIUM CARBONATE ANTACID 500 MG PO CHEW: 2.0000 | CHEWABLE_TABLET | ORAL | Status: DC | PRN

## 2020-02-10 MED ORDER — PRENATAL MULTIVITAMIN CH: 1.0000 | ORAL_TABLET | Freq: Every day | ORAL | Status: DC

## 2020-02-10 MED ORDER — ASPIRIN EC 81 MG PO TBEC
81.0000 mg | DELAYED_RELEASE_TABLET | Freq: Every day | ORAL | Status: DC
  Filled 2020-02-10: qty 1

## 2020-02-10 MED ORDER — ZOLPIDEM TARTRATE 5 MG PO TABS
5.0000 mg | ORAL_TABLET | Freq: Every evening | ORAL | Status: DC | PRN
  Administered 2020-02-10: 5 mg via ORAL
  Filled 2020-02-10: qty 1

## 2020-02-10 MED ORDER — ACETAMINOPHEN 325 MG PO TABS: 650.0000 mg | ORAL_TABLET | ORAL | Status: DC | PRN

## 2020-02-10 MED ORDER — PANTOPRAZOLE SODIUM 40 MG PO TBEC
40.0000 mg | DELAYED_RELEASE_TABLET | Freq: Every day | ORAL | Status: DC
  Filled 2020-02-10: qty 1

## 2020-02-10 MED ORDER — METFORMIN HCL 500 MG PO TABS
500.0000 mg | ORAL_TABLET | Freq: Two times a day (BID) | ORAL | Status: DC
  Administered 2020-02-10: 500 mg via ORAL
  Filled 2020-02-10: qty 1

## 2020-02-10 NOTE — H&P (Addendum)
OBSTETRIC ADMISSION HISTORY AND PHYSICAL  Melissa Medina is a 49 y.o. female [name] '@35' .4 wks presenting from office for SOB in setting of gHTN. Pt reports onset last night. SOB is worse with lying down position, improves when she is upright. Denies CP, RUQ pain, HA, and visual disturbances. Reports +FM but thinks its less than usual. Hx of gHTN dx at 31wks, AMA, IVF with donor egg, and A2GDM. Workup in the MAU showed no PEC, pulmonary edema, normal BPP and AFI however variable and prolonged decels were noted after returning from ultrasound therefore pt admitted for obs.  Dating: By embryo transfer --->  Estimated Date of Delivery: 03/12/20  Sono:    '@[redacted]w[redacted]d' , normal anatomy, breech presentation, 2127g, 72%ile, EFW 4'11   Prenatal History/Complications: - gHTN - AMA - IVF pregnancy - A2GDM  Past Medical History: Past Medical History:  Diagnosis Date  . Anemia   . Anxiety   . Bilateral plantar fasciitis   . Chronic headaches   . Fibroid   . Fibromyalgia   . GERD (gastroesophageal reflux disease)   . Gestational diabetes   . Herniated disc, cervical   . Migraine   . Patellofemoral syndrome of left knee   . Patellofemoral syndrome of right knee   . PTSD (post-traumatic stress disorder)   . Sleep apnea     Past Surgical History: Past Surgical History:  Procedure Laterality Date  . APPENDECTOMY     childhood  . BREAST REDUCTION SURGERY  2009  . CHOLECYSTECTOMY    . DILATION AND CURETTAGE OF UTERUS    . LIPOSUCTION MULTIPLE BODY PARTS    . TONSILLECTOMY     childhood    Obstetrical History: OB History    Gravida  3   Para  2   Term  2   Preterm      AB      Living  2     SAB      TAB      Ectopic      Multiple      Live Births  2           Social History: Social History   Socioeconomic History  . Marital status: Married    Spouse name: Not on file  . Number of children: Not on file  . Years of education: Not on file  . Highest education  level: Not on file  Occupational History  . Not on file  Tobacco Use  . Smoking status: Never Smoker  . Smokeless tobacco: Never Used  Vaping Use  . Vaping Use: Never used  Substance and Sexual Activity  . Alcohol use: No  . Drug use: No  . Sexual activity: Yes  Other Topics Concern  . Not on file  Social History Narrative  . Not on file   Social Determinants of Health   Financial Resource Strain:   . Difficulty of Paying Living Expenses: Not on file  Food Insecurity: No Food Insecurity  . Worried About Charity fundraiser in the Last Year: Never true  . Ran Out of Food in the Last Year: Never true  Transportation Needs: No Transportation Needs  . Lack of Transportation (Medical): No  . Lack of Transportation (Non-Medical): No  Physical Activity:   . Days of Exercise per Week: Not on file  . Minutes of Exercise per Session: Not on file  Stress:   . Feeling of Stress : Not on file  Social Connections:   . Frequency of  Communication with Friends and Family: Not on file  . Frequency of Social Gatherings with Friends and Family: Not on file  . Attends Religious Services: Not on file  . Active Member of Clubs or Organizations: Not on file  . Attends Archivist Meetings: Not on file  . Marital Status: Not on file    Family History: Family History  Problem Relation Age of Onset  . Hypertension Mother   . Diabetes Mother   . Kidney failure Mother   . Healthy Father   . Aneurysm Father        brain    Allergies: No Known Allergies  Medications Prior to Admission  Medication Sig Dispense Refill Last Dose  . acetaminophen (TYLENOL) 325 MG tablet Take 650 mg by mouth every 6 (six) hours as needed.   Past Month at Unknown time  . aspirin EC 81 MG tablet Take 1 tablet (81 mg total) by mouth daily. Take after 12 weeks for prevention of preeclampsia later in pregnancy 300 tablet 2 02/10/2020 at Unknown time  . Calcium Carbonate-Vit D-Min (CALCIUM 1200 PO) Take by  mouth.   02/10/2020 at Unknown time  . ferrous sulfate 325 (65 FE) MG EC tablet Take 325 mg by mouth every other day.   02/10/2020 at Unknown time  . metFORMIN (GLUCOPHAGE) 500 MG tablet Take 1 tablet (500 mg total) by mouth 2 (two) times daily with a meal. 60 tablet 5 02/10/2020 at Unknown time  . pantoprazole (PROTONIX) 40 MG tablet Take 1 tablet (40 mg total) by mouth daily. 30 tablet 3 02/10/2020 at Unknown time  . Prenatal Vit-Fe Fumarate-FA (PRENATAL MULTIVITAMIN) TABS tablet Take 1 tablet by mouth daily at 12 noon.   02/10/2020 at Unknown time  . vitamin B-12 (CYANOCOBALAMIN) 500 MCG tablet Take 1,000 mcg by mouth daily.   02/10/2020 at Unknown time  . vitamin C (ASCORBIC ACID) 250 MG tablet Take 250 mg by mouth every other day.    02/10/2020 at Unknown time  . glucose blood (FREESTYLE TEST STRIPS) test strip 1 each by Other route in the morning, at noon, in the evening, and at bedtime. 100 strip 2   . glucose monitoring kit (FREESTYLE) monitoring kit 1 each by Does not apply route as needed for other. 1 each 0   . Lancets (FREESTYLE) lancets Use to check blood sugars 4 times per day (fasting & 2 hours after each meal) 120 each 1      Review of Systems:  All systems reviewed and negative except as stated in HPI  PE: Blood pressure (!) 145/79, pulse 84, temperature 98.2 F (36.8 C), resp. rate 18, height '5\' 5"'  (1.651 m), weight 108.4 kg, last menstrual period 06/05/2019, SpO2 99 %.  Patient Vitals for the past 24 hrs:  BP Temp Pulse Resp SpO2 Height Weight  02/10/20 1901 (!) 145/79 -- 84 -- -- -- --  02/10/20 1900 -- -- -- -- 99 % -- --  02/10/20 1846 (!) 142/79 -- 87 -- -- -- --  02/10/20 1830 (!) 157/95 -- 85 -- 99 % -- --  02/10/20 1816 (!) 150/79 -- 85 -- -- -- --  02/10/20 1815 -- -- -- -- 99 % -- --  02/10/20 1813 -- -- -- -- 98 % -- --  02/10/20 1759 (!) 156/89 -- 84 -- -- -- --  02/10/20 1731 (!) 146/80 -- 92 -- -- -- --  02/10/20 1716 (!) 141/75 -- 82 -- -- -- --   02/10/20 1701  135/79 -- 84 -- -- -- --  02/10/20 1646 139/85 -- 87 -- -- -- --  02/10/20 1631 (!) 141/77 -- 82 -- -- -- --  02/10/20 1630 -- -- -- -- 96 % -- --  02/10/20 1615 135/76 -- 84 -- -- -- --  02/10/20 1601 135/83 -- 87 -- -- -- --  02/10/20 1600 -- -- -- -- 98 % -- --  02/10/20 1550 -- -- -- -- 98 % -- --  02/10/20 1546 136/81 -- 88 -- -- -- --  02/10/20 1530 (!) 144/81 -- 92 -- 100 % -- --  02/10/20 1520 -- -- -- -- 99 % -- --  02/10/20 1516 (!) 143/83 -- 89 -- -- -- --  02/10/20 1509 -- -- -- -- 99 % -- --  02/10/20 1501 (!) 154/89 -- 88 -- -- -- --  02/10/20 1500 -- -- -- -- 99 % -- --  02/10/20 1447 (!) 156/93 -- 93 18 99 % -- --  02/10/20 1436 -- -- -- -- 99 % -- --  02/10/20 1433 (!) 155/92 98.2 F (36.8 C) 90 18 -- '5\' 5"'  (1.651 m) 108.4 kg    General appearance: alert, cooperative and no distress Lungs: CTAB Heart: RRR Abdomen: soft, non-tender Extremities: Homans sign is negative, no sign of DVT Presentation: cephalic EFM: 161 bpm, mod variability, + accels, no decels Toco: UI  Prenatal labs: ABO, Rh: O/Positive/-- (06/02 1226) Antibody: Negative (06/02 1226) Rubella: 7.85 (06/02 1226) RPR: Non Reactive (10/12 0821)  HBsAg: Negative (06/02 1226)  HIV: Non Reactive (10/12 0821)   Prenatal Transfer Tool  Maternal Diabetes: Yes:  Diabetes Type:  Insulin/Medication controlled Genetic Screening: Normal Maternal Ultrasounds/Referrals: Normal Fetal Ultrasounds or other Referrals:  Referred to Materal Fetal Medicine  Maternal Substance Abuse:  No Significant Maternal Medications:  Meds include: Protonix Other:  Metformin Significant Maternal Lab Results: None  Results for orders placed or performed during the hospital encounter of 02/10/20 (from the past 24 hour(s))  Urinalysis, Routine w reflex microscopic   Collection Time: 02/10/20  2:41 PM  Result Value Ref Range   Color, Urine AMBER (A) YELLOW   APPearance HAZY (A) CLEAR   Specific Gravity, Urine  1.032 (H) 1.005 - 1.030   pH 5.0 5.0 - 8.0   Glucose, UA NEGATIVE NEGATIVE mg/dL   Hgb urine dipstick NEGATIVE NEGATIVE   Bilirubin Urine NEGATIVE NEGATIVE   Ketones, ur NEGATIVE NEGATIVE mg/dL   Protein, ur 30 (A) NEGATIVE mg/dL   Nitrite NEGATIVE NEGATIVE   Leukocytes,Ua NEGATIVE NEGATIVE   RBC / HPF 0-5 0 - 5 RBC/hpf   WBC, UA 0-5 0 - 5 WBC/hpf   Bacteria, UA RARE (A) NONE SEEN   Squamous Epithelial / LPF 0-5 0 - 5   Mucus PRESENT   Protein / creatinine ratio, urine   Collection Time: 02/10/20  3:18 PM  Result Value Ref Range   Creatinine, Urine 228.62 mg/dL   Total Protein, Urine 35 mg/dL   Protein Creatinine Ratio 0.15 0.00 - 0.15 mg/mg[Cre]  CBC   Collection Time: 02/10/20  3:44 PM  Result Value Ref Range   WBC 8.3 4.0 - 10.5 K/uL   RBC 3.66 (L) 3.87 - 5.11 MIL/uL   Hemoglobin 10.4 (L) 12.0 - 15.0 g/dL   HCT 31.9 (L) 36 - 46 %   MCV 87.2 80.0 - 100.0 fL   MCH 28.4 26.0 - 34.0 pg   MCHC 32.6 30.0 - 36.0 g/dL   RDW 15.1 11.5 -  15.5 %   Platelets 228 150 - 400 K/uL   nRBC 0.0 0.0 - 0.2 %  Comprehensive metabolic panel   Collection Time: 02/10/20  3:44 PM  Result Value Ref Range   Sodium 138 135 - 145 mmol/L   Potassium 4.1 3.5 - 5.1 mmol/L   Chloride 107 98 - 111 mmol/L   CO2 22 22 - 32 mmol/L   Glucose, Bld 86 70 - 99 mg/dL   BUN 10 6 - 20 mg/dL   Creatinine, Ser 0.64 0.44 - 1.00 mg/dL   Calcium 8.9 8.9 - 10.3 mg/dL   Total Protein 5.5 (L) 6.5 - 8.1 g/dL   Albumin 2.4 (L) 3.5 - 5.0 g/dL   AST 35 15 - 41 U/L   ALT 55 (H) 0 - 44 U/L   Alkaline Phosphatase 142 (H) 38 - 126 U/L   Total Bilirubin 0.5 0.3 - 1.2 mg/dL   GFR, Estimated >60 >60 mL/min   Anion gap 9 5 - 15   DG Chest 1 View  Result Date: 02/10/2020 CLINICAL DATA:  Shortness of breath EXAM: CHEST  1 VIEW COMPARISON:  September 17, 2016 FINDINGS: The cardiomediastinal silhouette is unchanged and normal in contour.Mildly tortuous thoracic aorta. No pleural effusion. No pneumothorax. No acute  pleuroparenchymal abnormality. Visualized abdomen is unremarkable. Degenerative changes of the RIGHT greater than LEFT acromioclavicular joint. IMPRESSION: No acute cardiopulmonary abnormality. Electronically Signed   By: Valentino Saxon MD   On: 02/10/2020 17:02    Patient Active Problem List   Diagnosis Date Noted  . Fetal heart rate decelerations affecting management of mother 02/10/2020  . Gestational hypertension 01/14/2020  . Gestational diabetes 01/05/2020  . Pregnancy resulting from in vitro fertilization 08/25/2019  . Group B streptococcal bacteriuria 08/25/2019  . Obesity 08/25/2019  . Acute nonintractable headache 08/17/2019  . Bilateral groin pain 08/17/2019  . Supervision of high risk pregnancy, antepartum 08/16/2019  . AMA (advanced maternal age) multigravida 35+ 08/16/2019  . Abnormal uterine bleeding 03/23/2019  . Bilateral fibrocystic breast changes 03/23/2019  . Cough 03/23/2019  . Depression 03/23/2019  . Chronic low back pain 03/23/2019  . Gastroesophageal reflux disease 03/23/2019  . History of cervical dysplasia 03/23/2019  . History of reduction mammoplasty 03/23/2019  . Iron deficiency anemia due to chronic blood loss 03/23/2019  . Menometrorrhagia 03/23/2019  . Pyelonephritis 03/23/2019  . Restless legs syndrome 03/23/2019  . Sleep apnea 03/23/2019  . Vitamin D deficiency 03/23/2019  . Migraine without aura and responsive to treatment 03/23/2019  . Posttraumatic stress disorder 03/23/2019  . Anxiety 11/21/2017  . Fibromyalgia 11/21/2017  . Pain syndrome, chronic 01/08/2017  . Uterine fibroid 03/25/2008   Assessment: [redacted] weeks gestation FHR decelerations   Plan: Admit to [facility] unit Continuous EFM Mngt per Dr. Milas Hock, CNM  02/10/2020, 7:21 PM

## 2020-02-10 NOTE — Progress Notes (Signed)
   PRENATAL VISIT NOTE  Subjective:  Melissa Medina is a 49 y.o. [name] at [redacted]w[redacted]d being seen today for ongoing prenatal care.  She is currently monitored for the following issues for this high-risk pregnancy and has Supervision of high risk pregnancy, antepartum; AMA (advanced maternal age) multigravida 85+; Acute nonintractable headache; Bilateral groin pain; Pregnancy resulting from in vitro fertilization; Group B streptococcal bacteriuria; Obesity; Abnormal uterine bleeding; Anxiety; Bilateral fibrocystic breast changes; Cough; Depression; Pain syndrome, chronic; Fibromyalgia; Chronic low back pain; Gastroesophageal reflux disease; History of cervical dysplasia; History of reduction mammoplasty; Iron deficiency anemia due to chronic blood loss; Menometrorrhagia; Pyelonephritis; Restless legs syndrome; Sleep apnea; Uterine fibroid; Vitamin D deficiency; Migraine without aura and responsive to treatment; Posttraumatic stress disorder; Gestational diabetes; and Gestational hypertension on their problem list.  Patient reports feeling generally unwell. Having headaches, no vision changes. Feeling very short of breath. Extremely fatigud.  Contractions: Irritability. Vag. Bleeding: None.  Movement: Present. Denies leaking of fluid.   Reports BP of 171/100 at home. All blood pressures have been higher than 130/90. Mostly around 150/90s. Blood sugars also diffusely elevated. Recently started on metformin.   The following portions of the patient's history were reviewed and updated as appropriate: allergies, current medications, past family history, past medical history, past social history, past surgical history and problem list.   Objective:   Vitals:   02/10/20 1044 02/10/20 1046  BP: (!) 158/92 (!) 152/79  Pulse: 94 86  Weight: 239 lb 6.4 oz (108.6 kg)     Fetal Status: Fetal Heart Rate (bpm): 153   Movement: Present     General:  Alert, oriented and cooperative. Patient is in no acute distress.   Skin: Skin is warm and dry. No rash noted.   Cardiovascular: Normal heart rate noted  Respiratory: Normal respiratory effort, no problems with respiration noted  Abdomen: Soft, gravid, appropriate for gestational age. No RUQ tenderness. Pain/Pressure: Present     Pelvic: Cervical exam deferred        Extremities: Normal range of motion.  Edema: Mild pitting, slight indentation  Mental Status: Normal mood and affect. Normal behavior. Normal judgment and thought content.   Assessment and Plan:  Pregnancy: [name] at [redacted]w[redacted]d 1. Supervision of high risk pregnancy, antepartum -multiple elevated BP on repeat checks in office, border on SR and has had severe range pressures at home. Had elevated LFT's on prior bloodwork. Given overall picture advised patient to be evaluated in MAU for PEC w SF. Discussed plan in detail with patient and she is in agreement.   -s/p BMZ 11/15, 11/16   2. Gestational diabetes mellitus (GDM) in third trimester controlled on oral hypoglycemic drug -Continue metformin  3. Gestational hypertension without significant proteinuria in third trimester As above   4. [redacted] weeks gestation of pregnancy   5. BMI 39.0-39.9,adult -Total weight gain 40 lbs   6. Multigravida of advanced maternal age in third trimester -has been taking asa   7. Pregnancy resulting from in vitro fertilization in third trimester   Preterm labor symptoms and general obstetric precautions including but not limited to vaginal bleeding, contractions, leaking of fluid and fetal movement were reviewed in detail with the patient. Please refer to After Visit Summary for other counseling recommendations.   No follow-ups on file.  Future Appointments  Date Time Provider [place]  02/14/2020 12:30 PM [facility] NURSE [facility]  02/14/2020 12:45 PM [facility]    Janet Berlin, MD

## 2020-02-10 NOTE — MAU Note (Signed)
Here for elevated 159/92.  Denies any headache just stated she has "burning in her eyes".  Also c/o some SOB. Reports feeling fetal movement but not as much as they say he should move. C/O some cramping on her left side.

## 2020-02-11 ENCOUNTER — Encounter (HOSPITAL_COMMUNITY): Payer: Self-pay | Admitting: *Deleted

## 2020-02-11 DIAGNOSIS — O368130 Decreased fetal movements, third trimester, not applicable or unspecified: Secondary | ICD-10-CM | POA: Diagnosis not present

## 2020-02-11 DIAGNOSIS — O368190 Decreased fetal movements, unspecified trimester, not applicable or unspecified: Secondary | ICD-10-CM

## 2020-02-11 DIAGNOSIS — O368330 Maternal care for abnormalities of the fetal heart rate or rhythm, third trimester, not applicable or unspecified: Secondary | ICD-10-CM

## 2020-02-11 DIAGNOSIS — O24415 Gestational diabetes mellitus in pregnancy, controlled by oral hypoglycemic drugs: Secondary | ICD-10-CM | POA: Diagnosis not present

## 2020-02-11 DIAGNOSIS — O368390 Maternal care for abnormalities of the fetal heart rate or rhythm, unspecified trimester, not applicable or unspecified: Secondary | ICD-10-CM | POA: Diagnosis not present

## 2020-02-11 DIAGNOSIS — O133 Gestational [pregnancy-induced] hypertension without significant proteinuria, third trimester: Secondary | ICD-10-CM

## 2020-02-11 DIAGNOSIS — Z3A35 35 weeks gestation of pregnancy: Secondary | ICD-10-CM

## 2020-02-11 LAB — GLUCOSE, CAPILLARY: Glucose-Capillary: 71 mg/dL (ref 70–99)

## 2020-02-11 LAB — TYPE AND SCREEN
ABO/RH(D): O POS
Antibody Screen: NEGATIVE

## 2020-02-11 MED ORDER — SODIUM CHLORIDE 0.9% FLUSH: 3.0000 mL | Freq: Two times a day (BID) | INTRAVENOUS | Status: DC

## 2020-02-11 MED ORDER — SODIUM CHLORIDE 0.9 % IV SOLN: 250.0000 mL | INTRAVENOUS | Status: DC | PRN

## 2020-02-11 MED ORDER — SODIUM CHLORIDE 0.9% FLUSH: 3.0000 mL | INTRAVENOUS | Status: DC | PRN

## 2020-02-11 NOTE — Discharge Instructions (Signed)
Preterm Labor and Birth Information Pregnancy normally lasts 39-41 weeks. Preterm labor is when labor starts early. It starts before you have been pregnant for 37 whole weeks. What are the risk factors for preterm labor? Preterm labor is more likely to occur in women who:  Have an infection while pregnant.  Have a cervix that is short.  Have gone into preterm labor before.  Have had surgery on their cervix.  Are younger than age 51.  Are older than age 24.  Are African American.  Are pregnant with two or more babies.  Take street drugs while pregnant.  Smoke while pregnant.  Do not gain enough weight while pregnant.  Got pregnant right after another pregnancy. What are the symptoms of preterm labor? Symptoms of preterm labor include:  Cramps. The cramps may feel like the cramps some women get during their period. The cramps may happen with watery poop (diarrhea).  Pain in the belly (abdomen).  Pain in the lower back.  Regular contractions or tightening. It may feel like your belly is getting tighter.  Pressure in the lower belly that seems to get stronger.  More fluid (discharge) leaking from the vagina. The fluid may be watery or bloody.  Water breaking. Why is it important to notice signs of preterm labor? Babies who are born early may not be fully developed. They have a higher chance for:  Long-term heart problems.  Long-term lung problems.  Trouble controlling body systems, like breathing.  Bleeding in the brain.  A condition called cerebral palsy.  Learning difficulties.  Death. These risks are highest for babies who are born before 24 weeks of pregnancy. How is preterm labor treated? Treatment depends on:  How long you were pregnant.  Your condition.  The health of your baby. Treatment may involve:  Having a stitch (suture) placed in your cervix. When you give birth, your cervix opens so the baby can come out. The stitch keeps the cervix  from opening too soon.  Staying at the hospital.  Taking or getting medicines, such as: ? Hormone medicines. ? Medicines to stop contractions. ? Medicines to help the baby's lungs develop. ? Medicines to prevent your baby from having cerebral palsy. What should I do if I am in preterm labor? If you think you are going into labor too soon, call your doctor right away. How can I prevent preterm labor?  Do not use any tobacco products. ? Examples of these are cigarettes, chewing tobacco, and e-cigarettes. ? If you need help quitting, ask your doctor.  Do not use street drugs.  Do not use any medicines unless you ask your doctor if they are safe for you.  Talk with your doctor before taking any herbal supplements.  Make sure you gain enough weight.  Watch for infection. If you think you might have an infection, get it checked right away.  If you have gone into preterm labor before, tell your doctor. This information is not intended to replace advice given to you by your health care provider. Make sure you discuss any questions you have with your health care provider. Document Revised: 07/03/2018 Document Reviewed: 08/02/2015 Elsevier Patient Education  [place].

## 2020-02-11 NOTE — Plan of Care (Signed)
  Problem: Nutrition: Goal: Adequate nutrition will be maintained Outcome: Completed/Met   Problem: Coping: Goal: Level of anxiety will decrease Outcome: Completed/Met   Problem: Elimination: Goal: Will not experience complications related to urinary retention Outcome: Completed/Met   Problem: Education: Goal: Knowledge of disease or condition will improve Outcome: Completed/Met   Problem: Coping: Goal: Ability to identify and develop effective coping behavior will improve Outcome: Completed/Met

## 2020-02-11 NOTE — Progress Notes (Signed)
Pt discharged home with husband.  Condition stable.  Reviewed discharge instructions with pt and husband together, including PreEclampsia teaching sheet.  Pt ambulated to car with husband, did not wait for staff to escort her.  No equipment for home ordered at discharge.

## 2020-02-11 NOTE — Discharge Summary (Signed)
Patient ID: Melissa Medina MRN: [id number] DOB/AGE: [birth year] 49 y.o.  Admit date: 02/10/2020 Discharge date: 02/11/2020  Admission Diagnoses:60w5dFetal heart rate deceleration  Discharge Diagnoses: Reassuring fetal surveillance  Prenatal Procedures: ultrasound  Consults: None  Hospital Course:  This is a 49y.o. [id number] IUP at 369w5ddmitted for monitoring overnight. Presenting from office for SOB in setting of gHTN. Pt reports onset last night. SOB is worse with lying down position, improves when she is upright. Denies CP, RUQ pain, HA, and visual disturbances. Reports +FM but thinks its less than usual. Hx of gHTN dx at 31wks, AMA, IVF with donor egg, and A2GDM.Workup in the MAU showed no PEC, pulmonary edema, normal BPP and AFI however variable and prolonged decels were noted after returning from ultrasound therefore pt admitted for obs.She was observed, fetal heart rate monitoring remained reassuring, and she had no signs/symptoms of progressing preterm labor or other maternal-fetal concerns. She was deemed stable for discharge to home with outpatient follow up.  Discharge Exam: Temp:  [98 F (36.7 C)-98.2 F (36.8 C)] 98.2 F (36.8 C) (11/19 0401) Pulse Rate:  [80-94] 80 (11/19 0401) Resp:  [16-18] 16 (11/19 0401) BP: (134-158)/(73-95) 134/73 (11/19 0401) SpO2:  [96 %-100 %] 100 % (11/19 0401) Weight:  [108.4 kg-108.6 kg] 108.4 kg (11/18 1433) Physical Examination: CONSTITUTIONAL: Well-developed, well-nourished female in no acute distress.  HENT:  Normocephalic, atraumatic, External right and left ear normal. Oropharynx is clear and moist EYES: Conjunctivae and EOM are normal. Pupils are equal, round, and reactive to light. No scleral icterus.  NECK: Normal range of motion, supple, no masses SKIN: Skin is warm and dry. No rash noted. Not diaphoretic. No erythema. No pallor. [place] and oriented to person, place, and time. Normal reflexes, muscle tone  coordination. No cranial nerve deficit noted. PSYCHIATRIC: Normal mood and affect. Normal behavior. Normal judgment and thought content. CARDIOVASCULAR: Normal heart rate noted, regular rhythm RESPIRATORY: Effort and breath sounds normal, no problems with respiration noted MUSCULOSKELETAL: Normal range of motion. No edema and no tenderness. 2+ distal pulses. ABDOMEN: Soft, nontender, nondistended, gravid. CERVIX:    Fetal monitoring: FHR: 140 bpm, Variability: moderate, Accelerations: Present, Decelerations: Absent  Uterine activity: 0 contractions per hour  Significant Diagnostic Studies:  Results for orders placed or performed during the hospital encounter of 02/10/20 (from the past 168 hour(s))  Urinalysis, Routine w reflex microscopic   Collection Time: 02/10/20  2:41 PM  Result Value Ref Range   Color, Urine AMBER (A) YELLOW   APPearance HAZY (A) CLEAR   Specific Gravity, Urine 1.032 (H) 1.005 - 1.030   pH 5.0 5.0 - 8.0   Glucose, UA NEGATIVE NEGATIVE mg/dL   Hgb urine dipstick NEGATIVE NEGATIVE   Bilirubin Urine NEGATIVE NEGATIVE   Ketones, ur NEGATIVE NEGATIVE mg/dL   Protein, ur 30 (A) NEGATIVE mg/dL   Nitrite NEGATIVE NEGATIVE   Leukocytes,Ua NEGATIVE NEGATIVE   RBC / HPF 0-5 0 - 5 RBC/hpf   WBC, UA 0-5 0 - 5 WBC/hpf   Bacteria, UA RARE (A) NONE SEEN   Squamous Epithelial / LPF 0-5 0 - 5   Mucus PRESENT   Protein / creatinine ratio, urine   Collection Time: 02/10/20  3:18 PM  Result Value Ref Range   Creatinine, Urine 228.62 mg/dL   Total Protein, Urine 35 mg/dL   Protein Creatinine Ratio 0.15 0.00 - 0.15 mg/mg[Cre]  CBC   Collection Time: 02/10/20  3:44 PM  Result Value Ref Range   WBC  8.3 4.0 - 10.5 K/uL   RBC 3.66 (L) 3.87 - 5.11 MIL/uL   Hemoglobin 10.4 (L) 12.0 - 15.0 g/dL   HCT 31.9 (L) 36 - 46 %   MCV 87.2 80.0 - 100.0 fL   MCH 28.4 26.0 - 34.0 pg   MCHC 32.6 30.0 - 36.0 g/dL   RDW 15.1 11.5 - 15.5 %   Platelets 228 150 - 400 K/uL   nRBC 0.0 0.0 - 0.2  %  Comprehensive metabolic panel   Collection Time: 02/10/20  3:44 PM  Result Value Ref Range   Sodium 138 135 - 145 mmol/L   Potassium 4.1 3.5 - 5.1 mmol/L   Chloride 107 98 - 111 mmol/L   CO2 22 22 - 32 mmol/L   Glucose, Bld 86 70 - 99 mg/dL   BUN 10 6 - 20 mg/dL   Creatinine, Ser 0.64 0.44 - 1.00 mg/dL   Calcium 8.9 8.9 - 10.3 mg/dL   Total Protein 5.5 (L) 6.5 - 8.1 g/dL   Albumin 2.4 (L) 3.5 - 5.0 g/dL   AST 35 15 - 41 U/L   ALT 55 (H) 0 - 44 U/L   Alkaline Phosphatase 142 (H) 38 - 126 U/L   Total Bilirubin 0.5 0.3 - 1.2 mg/dL   GFR, Estimated >60 >60 mL/min   Anion gap 9 5 - 15  Type and screen [place]   Collection Time: 02/10/20  3:44 PM  Result Value Ref Range   ABO/RH(D) O POS    Antibody Screen NEG    Sample Expiration      02/13/2020,2359 Performed at [facility], [address], [place], [place] [address]   Glucose, capillary   Collection Time: 02/10/20  9:35 PM  Result Value Ref Range   Glucose-Capillary 84 70 - 99 mg/dL  Glucose, capillary   Collection Time: 02/11/20  6:43 AM  Result Value Ref Range   Glucose-Capillary 71 70 - 99 mg/dL   Comment 1 Document in Chart     Discharge Condition: Stable  Disposition: Discharge disposition: 01-Home or Self Care        Discharge Instructions    Discharge patient   Complete by: As directed    Discharge disposition: 01-Home or Self Care   Discharge patient date: 02/11/2020     Allergies as of 02/11/2020   No Known Allergies     Medication List    TAKE these medications   acetaminophen 325 MG tablet Commonly known as: TYLENOL Take 650 mg by mouth every 6 (six) hours as needed.   aspirin EC 81 MG tablet Take 1 tablet (81 mg total) by mouth daily. Take after 12 weeks for prevention of preeclampsia later in pregnancy   CALCIUM 1200 PO Take by mouth.   ferrous sulfate 325 (65 FE) MG EC tablet Take 325 mg by mouth every other day.   freestyle lancets Use to check  blood sugars 4 times per day (fasting & 2 hours after each meal)   FREESTYLE TEST STRIPS test strip Generic drug: glucose blood 1 each by Other route in the morning, at noon, in the evening, and at bedtime.   glucose monitoring kit monitoring kit 1 each by Does not apply route as needed for other.   metFORMIN 500 MG tablet Commonly known as: GLUCOPHAGE Take 1 tablet (500 mg total) by mouth 2 (two) times daily with a meal.   pantoprazole 40 MG tablet Commonly known as: Protonix Take 1 tablet (40  mg total) by mouth daily.   prenatal multivitamin Tabs tablet Take 1 tablet by mouth daily at 12 noon.   vitamin B-12 500 MCG tablet Commonly known as: CYANOCOBALAMIN Take 1,000 mcg by mouth daily.   vitamin C 250 MG tablet Commonly known as: ASCORBIC ACID Take 250 mg by mouth every other day.       Follow-up [facility] Follow up in 3 day(s).   Specialty: Obstetrics and Gynecology Contact information: [place] [address] [phone]              Signed: Emeterio Reeve M.D. 02/11/2020, 7:39 AM

## 2020-02-14 ENCOUNTER — Other Ambulatory Visit: Payer: Self-pay

## 2020-02-14 ENCOUNTER — Ambulatory Visit: Payer: No Typology Code available for payment source | Admitting: *Deleted

## 2020-02-14 ENCOUNTER — Other Ambulatory Visit: Payer: Self-pay | Admitting: Obstetrics

## 2020-02-14 ENCOUNTER — Ambulatory Visit: Payer: No Typology Code available for payment source | Attending: Obstetrics and Gynecology

## 2020-02-14 ENCOUNTER — Encounter: Payer: Self-pay | Admitting: *Deleted

## 2020-02-14 DIAGNOSIS — O99213 Obesity complicating pregnancy, third trimester: Secondary | ICD-10-CM

## 2020-02-14 DIAGNOSIS — O368130 Decreased fetal movements, third trimester, not applicable or unspecified: Secondary | ICD-10-CM | POA: Diagnosis not present

## 2020-02-14 DIAGNOSIS — Z3A36 36 weeks gestation of pregnancy: Secondary | ICD-10-CM

## 2020-02-14 DIAGNOSIS — O0990 Supervision of high risk pregnancy, unspecified, unspecified trimester: Secondary | ICD-10-CM

## 2020-02-14 DIAGNOSIS — O09813 Supervision of pregnancy resulting from assisted reproductive technology, third trimester: Secondary | ICD-10-CM | POA: Diagnosis not present

## 2020-02-14 DIAGNOSIS — O09523 Supervision of elderly multigravida, third trimester: Secondary | ICD-10-CM | POA: Insufficient documentation

## 2020-02-14 DIAGNOSIS — Z362 Encounter for other antenatal screening follow-up: Secondary | ICD-10-CM

## 2020-02-14 DIAGNOSIS — O133 Gestational [pregnancy-induced] hypertension without significant proteinuria, third trimester: Secondary | ICD-10-CM

## 2020-02-14 DIAGNOSIS — D259 Leiomyoma of uterus, unspecified: Secondary | ICD-10-CM

## 2020-02-14 DIAGNOSIS — O3413 Maternal care for benign tumor of corpus uteri, third trimester: Secondary | ICD-10-CM

## 2020-02-14 DIAGNOSIS — E669 Obesity, unspecified: Secondary | ICD-10-CM

## 2020-02-14 NOTE — Procedures (Signed)
Chloey Ricard [birth year] [redacted]w[redacted]d  Fetus A Non-Stress Test Interpretation for 02/14/20  Indication: Advanced Maternal Age >40 years, GHTN  Fetal Heart Rate A Mode: External Baseline Rate (A): 130 bpm Variability: Moderate Accelerations: 15 x 15 Decelerations: None Multiple birth?: No  Uterine Activity Mode: Palpation, Toco Contraction Frequency (min): Occas. UI Contraction Quality: Mild Resting Tone Palpated: Relaxed Resting Time: Adequate  Interpretation (Fetal Testing) Nonstress Test Interpretation: Reactive Comments: Dr. Donalee Citrin reviewed tracing.

## 2020-02-16 ENCOUNTER — Encounter (HOSPITAL_COMMUNITY): Payer: Self-pay | Admitting: *Deleted

## 2020-02-16 ENCOUNTER — Other Ambulatory Visit (HOSPITAL_COMMUNITY)
Admission: RE | Admit: 2020-02-16 | Discharge: 2020-02-16 | Disposition: A | Discharge status: Home / Self Care | Payer: No Typology Code available for payment source | Source: Ambulatory Visit | Attending: Obstetrics and Gynecology | Admitting: Obstetrics and Gynecology

## 2020-02-16 ENCOUNTER — Other Ambulatory Visit: Payer: Self-pay

## 2020-02-16 ENCOUNTER — Telehealth (HOSPITAL_COMMUNITY): Payer: Self-pay | Admitting: *Deleted

## 2020-02-16 ENCOUNTER — Ambulatory Visit (INDEPENDENT_AMBULATORY_CARE_PROVIDER_SITE_OTHER): Admitting: Obstetrics and Gynecology

## 2020-02-16 ENCOUNTER — Encounter: Payer: Self-pay | Admitting: Obstetrics and Gynecology

## 2020-02-16 VITALS — BP 151/97 | HR 107 | Wt 230.5 lb

## 2020-02-16 DIAGNOSIS — O133 Gestational [pregnancy-induced] hypertension without significant proteinuria, third trimester: Secondary | ICD-10-CM

## 2020-02-16 DIAGNOSIS — O0990 Supervision of high risk pregnancy, unspecified, unspecified trimester: Secondary | ICD-10-CM | POA: Insufficient documentation

## 2020-02-16 DIAGNOSIS — O09523 Supervision of elderly multigravida, third trimester: Secondary | ICD-10-CM

## 2020-02-16 DIAGNOSIS — D259 Leiomyoma of uterus, unspecified: Secondary | ICD-10-CM

## 2020-02-16 DIAGNOSIS — R8271 Bacteriuria: Secondary | ICD-10-CM

## 2020-02-16 DIAGNOSIS — O24415 Gestational diabetes mellitus in pregnancy, controlled by oral hypoglycemic drugs: Secondary | ICD-10-CM

## 2020-02-16 DIAGNOSIS — O09813 Supervision of pregnancy resulting from assisted reproductive technology, third trimester: Secondary | ICD-10-CM

## 2020-02-16 DIAGNOSIS — Z3A36 36 weeks gestation of pregnancy: Secondary | ICD-10-CM

## 2020-02-16 LAB — POCT URINALYSIS DIP (DEVICE)
Bilirubin Urine: NEGATIVE
Glucose, UA: NEGATIVE mg/dL
Hgb urine dipstick: NEGATIVE
Leukocytes,Ua: NEGATIVE
Nitrite: NEGATIVE
Protein, ur: 100 mg/dL — AB
Specific Gravity, Urine: >=1.03 (ref 1.005–1.030)
Urobilinogen, UA: 0.2 mg/dL (ref 0.0–1.0)
pH: 5.5 (ref 5.0–8.0)

## 2020-02-16 NOTE — Telephone Encounter (Signed)
Preadmission screen  

## 2020-02-16 NOTE — Progress Notes (Signed)
Prenatal Visit Note Date: 02/16/2020 Clinic: [facility]  Subjective:  Melissa Medina is a 49 y.o. [name] at [redacted]w[redacted]d being seen today for ongoing prenatal care.  She is currently monitored for the following issues for this high-risk pregnancy and has Supervision of high risk pregnancy, antepartum; AMA (advanced maternal age) multigravida 29+; Acute nonintractable headache; Bilateral groin pain; Pregnancy resulting from in vitro fertilization; Group B streptococcal bacteriuria; Obesity; Anxiety; Bilateral fibrocystic breast changes; Depression; Pain syndrome, chronic; Fibromyalgia; Chronic low back pain; Gastroesophageal reflux disease; History of cervical dysplasia; Restless legs syndrome; Sleep apnea; Uterine fibroid; Vitamin D deficiency; Posttraumatic stress disorder; Gestational diabetes; and Gestational hypertension on their problem list.  Patient reports see below. no s/s of pre-eclampsia.   Contractions: Not present. Vag. Bleeding: None.  Movement: Present. Denies leaking of fluid.   The following portions of the patient's history were reviewed and updated as appropriate: allergies, current medications, past family history, past medical history, past social history, past surgical history and problem list. Problem list updated.  Objective:   Vitals:   02/16/20 1432 02/16/20 1440  BP: (!) 169/102 (!) 151/97  Pulse: (!) 101 (!) 107  Weight: 230 lb 8 oz (104.6 kg)     Fetal Status:     Movement: Present     General:  Alert, oriented and cooperative. Patient is in no acute distress.  Skin: Skin is warm and dry. No rash noted.   Cardiovascular: Normal heart rate noted  Respiratory: Normal respiratory effort, no problems with respiration noted  Abdomen: Soft, gravid, appropriate for gestational age. Pain/Pressure: Present     Pelvic:  Cervical exam deferred        Extremities: Normal range of motion.  Edema: Mild pitting, slight indentation  Mental  Status: Normal mood and affect. Normal behavior. Normal judgment and thought content.   Urinalysis:      Assessment and Plan:  Pregnancy: [name] at [redacted]w[redacted]d  1. Gestational hypertension, third trimester Precautions given. Will get surveillance labs today. bpp 10/10 yesterday with normal growth (50%, 2841gm, ac 53%). D/w her re: recommendation for 37wk delivery which she is amenable to and set up for sunday - CBC - Comprehensive metabolic panel - Protein / creatinine ratio, urine - Hepatitis panel, acute  2. Gestational diabetes mellitus (GDM) in third trimester controlled on oral hypoglycemic drug On metformin 500 w/ breakfast and 500 with dinner. Am fasting and 2h PP a few points above. I told her she can either go up to 1000 bidac or watch her diet more closely.   3. Supervision of high risk pregnancy, antepartum Long d/w patient. She is upset by not being scheduled with a female provider, inability to see the same provider, seemingly different messages from her visits.  - GC/Chlamydia probe amp ([place])not at [facility]  4. Uterine leiomyoma, unspecified location  5. Multigravida of advanced maternal age in third trimester  6. Pregnancy resulting from in vitro fertilization in third trimester S/p normal fetal echo  7. Group B streptococcal bacteriuria tx in labor  Preterm labor symptoms and general obstetric precautions including but not limited to vaginal bleeding, contractions, leaking of fluid and fetal movement were reviewed in detail with the patient. Please refer to After Visit Summary for other counseling recommendations.  Return in about 13 days (around 02/29/2020).for PP BP check   Aletha Halim, MD

## 2020-02-17 LAB — COMPREHENSIVE METABOLIC PANEL
ALT: 34 IU/L — ABNORMAL HIGH (ref 0–32)
AST: 25 IU/L (ref 0–40)
Albumin/Globulin Ratio: 1.5 (ref 1.2–2.2)
Albumin: 3.5 g/dL — ABNORMAL LOW (ref 3.8–4.9)
Alkaline Phosphatase: 206 IU/L — ABNORMAL HIGH (ref 44–121)
BUN/Creatinine Ratio: 15 (ref 9–23)
BUN: 10 mg/dL (ref 6–24)
Bilirubin Total: 0.5 mg/dL (ref 0.0–1.2)
CO2: 19 mmol/L — ABNORMAL LOW (ref 20–29)
Calcium: 9.1 mg/dL (ref 8.7–10.2)
Chloride: 104 mmol/L (ref 96–106)
Creatinine, Ser: 0.67 mg/dL (ref 0.57–1.00)
GFR calc Af Amer: 118 mL/min/{1.73_m2} (ref >59)
GFR calc non Af Amer: 102 mL/min/{1.73_m2} (ref >59)
Globulin, Total: 2.4 g/dL (ref 1.5–4.5)
Glucose: 107 mg/dL — ABNORMAL HIGH (ref 65–99)
Potassium: 4.2 mmol/L (ref 3.5–5.2)
Sodium: 136 mmol/L (ref 134–144)
Total Protein: 5.9 g/dL — ABNORMAL LOW (ref 6.0–8.5)

## 2020-02-17 LAB — CBC
Hematocrit: 35.9 % (ref 34.0–46.6)
Hemoglobin: 12.2 g/dL (ref 11.1–15.9)
MCH: 28.6 pg (ref 26.6–33.0)
MCHC: 34 g/dL (ref 31.5–35.7)
MCV: 84 fL (ref 79–97)
Platelets: 244 10*3/uL (ref 150–450)
RBC: 4.26 x10E6/uL (ref 3.77–5.28)
RDW: 14.2 % (ref 11.7–15.4)
WBC: 8.6 10*3/uL (ref 3.4–10.8)

## 2020-02-17 LAB — HEPATITIS PANEL, ACUTE
Hep A IgM: NEGATIVE
Hep B C IgM: NEGATIVE
Hep C Virus Ab: <0.1 s/co ratio (ref 0.0–0.9)
Hepatitis B Surface Ag: NEGATIVE

## 2020-02-17 LAB — PROTEIN / CREATININE RATIO, URINE
Creatinine, Urine: 257.9 mg/dL
Protein, Ur: 90.5 mg/dL
Protein/Creat Ratio: 351 mg/g creat — ABNORMAL HIGH (ref 0–200)

## 2020-02-18 ENCOUNTER — Other Ambulatory Visit (HOSPITAL_COMMUNITY)
Admission: RE | Admit: 2020-02-18 | Discharge: 2020-02-18 | Disposition: A | Discharge status: Home / Self Care | Payer: No Typology Code available for payment source | Source: Ambulatory Visit | Attending: Family Medicine | Admitting: Family Medicine

## 2020-02-18 DIAGNOSIS — Z01812 Encounter for preprocedural laboratory examination: Secondary | ICD-10-CM | POA: Insufficient documentation

## 2020-02-18 DIAGNOSIS — Z20822 Contact with and (suspected) exposure to covid-19: Secondary | ICD-10-CM | POA: Insufficient documentation

## 2020-02-18 LAB — GC/CHLAMYDIA PROBE AMP (~~LOC~~) NOT AT ARMC
Chlamydia: NEGATIVE
Comment: NEGATIVE
Comment: NORMAL
Neisseria Gonorrhea: NEGATIVE

## 2020-02-18 LAB — SARS CORONAVIRUS 2 (TAT 6-24 HRS): SARS Coronavirus 2: NEGATIVE

## 2020-02-19 ENCOUNTER — Other Ambulatory Visit: Payer: Self-pay | Admitting: Advanced Practice Midwife

## 2020-02-20 ENCOUNTER — Inpatient Hospital Stay (HOSPITAL_COMMUNITY): Payer: No Typology Code available for payment source

## 2020-02-20 ENCOUNTER — Other Ambulatory Visit: Payer: Self-pay

## 2020-02-20 ENCOUNTER — Encounter (HOSPITAL_COMMUNITY): Payer: Self-pay | Admitting: Family Medicine

## 2020-02-20 ENCOUNTER — Inpatient Hospital Stay (HOSPITAL_COMMUNITY): Payer: No Typology Code available for payment source | Admitting: Anesthesiology

## 2020-02-20 ENCOUNTER — Inpatient Hospital Stay (HOSPITAL_COMMUNITY)
Admission: RE | Admit: 2020-02-20 | Discharge: 2020-02-23 | DRG: 768 | Disposition: A | Discharge status: Home / Self Care | Payer: No Typology Code available for payment source | Attending: Obstetrics and Gynecology | Admitting: Obstetrics and Gynecology

## 2020-02-20 DIAGNOSIS — O1414 Severe pre-eclampsia complicating childbirth: Secondary | ICD-10-CM | POA: Diagnosis present

## 2020-02-20 DIAGNOSIS — Z3A37 37 weeks gestation of pregnancy: Secondary | ICD-10-CM

## 2020-02-20 DIAGNOSIS — O99214 Obesity complicating childbirth: Secondary | ICD-10-CM | POA: Diagnosis present

## 2020-02-20 DIAGNOSIS — O09529 Supervision of elderly multigravida, unspecified trimester: Secondary | ICD-10-CM

## 2020-02-20 DIAGNOSIS — O99824 Streptococcus B carrier state complicating childbirth: Secondary | ICD-10-CM | POA: Diagnosis present

## 2020-02-20 DIAGNOSIS — Z8632 Personal history of gestational diabetes: Secondary | ICD-10-CM | POA: Diagnosis present

## 2020-02-20 DIAGNOSIS — O1413 Severe pre-eclampsia, third trimester: Secondary | ICD-10-CM

## 2020-02-20 DIAGNOSIS — O24415 Gestational diabetes mellitus in pregnancy, controlled by oral hypoglycemic drugs: Secondary | ICD-10-CM

## 2020-02-20 DIAGNOSIS — O134 Gestational [pregnancy-induced] hypertension without significant proteinuria, complicating childbirth: Secondary | ICD-10-CM | POA: Diagnosis not present

## 2020-02-20 DIAGNOSIS — K219 Gastro-esophageal reflux disease without esophagitis: Secondary | ICD-10-CM | POA: Diagnosis present

## 2020-02-20 DIAGNOSIS — O24419 Gestational diabetes mellitus in pregnancy, unspecified control: Secondary | ICD-10-CM | POA: Diagnosis present

## 2020-02-20 DIAGNOSIS — O9962 Diseases of the digestive system complicating childbirth: Secondary | ICD-10-CM | POA: Diagnosis present

## 2020-02-20 DIAGNOSIS — R8271 Bacteriuria: Secondary | ICD-10-CM | POA: Diagnosis present

## 2020-02-20 DIAGNOSIS — O24425 Gestational diabetes mellitus in childbirth, controlled by oral hypoglycemic drugs: Secondary | ICD-10-CM | POA: Diagnosis present

## 2020-02-20 DIAGNOSIS — O1404 Mild to moderate pre-eclampsia, complicating childbirth: Secondary | ICD-10-CM | POA: Diagnosis present

## 2020-02-20 DIAGNOSIS — E669 Obesity, unspecified: Secondary | ICD-10-CM | POA: Diagnosis present

## 2020-02-20 LAB — CBC
HCT: 34.1 % — ABNORMAL LOW (ref 36.0–46.0)
HCT: 32.7 % — ABNORMAL LOW (ref 36.0–46.0)
Hemoglobin: 11.1 g/dL — ABNORMAL LOW (ref 12.0–15.0)
Hemoglobin: 10.6 g/dL — ABNORMAL LOW (ref 12.0–15.0)
MCH: 28.3 pg (ref 26.0–34.0)
MCH: 28 pg (ref 26.0–34.0)
MCHC: 32.6 g/dL (ref 30.0–36.0)
MCHC: 32.4 g/dL (ref 30.0–36.0)
MCV: 87 fL (ref 80.0–100.0)
MCV: 86.3 fL (ref 80.0–100.0)
Platelets: 226 10*3/uL (ref 150–400)
Platelets: 213 10*3/uL (ref 150–400)
RBC: 3.92 MIL/uL (ref 3.87–5.11)
RBC: 3.79 MIL/uL — ABNORMAL LOW (ref 3.87–5.11)
RDW: 15.1 % (ref 11.5–15.5)
RDW: 15 % (ref 11.5–15.5)
WBC: 7.4 10*3/uL (ref 4.0–10.5)
WBC: 8.4 10*3/uL (ref 4.0–10.5)
nRBC: 0 % (ref 0.0–0.2)
nRBC: 0 % (ref 0.0–0.2)

## 2020-02-20 LAB — COMPREHENSIVE METABOLIC PANEL
ALT: 34 U/L (ref 0–44)
AST: 28 U/L (ref 15–41)
Albumin: 2.4 g/dL — ABNORMAL LOW (ref 3.5–5.0)
Alkaline Phosphatase: 151 U/L — ABNORMAL HIGH (ref 38–126)
Anion gap: 11 (ref 5–15)
BUN: 8 mg/dL (ref 6–20)
CO2: 20 mmol/L — ABNORMAL LOW (ref 22–32)
Calcium: 8.6 mg/dL — ABNORMAL LOW (ref 8.9–10.3)
Chloride: 106 mmol/L (ref 98–111)
Creatinine, Ser: 0.57 mg/dL (ref 0.44–1.00)
GFR, Estimated: >60 mL/min (ref >60)
Glucose, Bld: 81 mg/dL (ref 70–99)
Potassium: 3.6 mmol/L (ref 3.5–5.1)
Sodium: 137 mmol/L (ref 135–145)
Total Bilirubin: 0.7 mg/dL (ref 0.3–1.2)
Total Protein: 6 g/dL — ABNORMAL LOW (ref 6.5–8.1)

## 2020-02-20 LAB — GLUCOSE, CAPILLARY
Glucose-Capillary: 73 mg/dL (ref 70–99)
Glucose-Capillary: 82 mg/dL (ref 70–99)
Glucose-Capillary: 75 mg/dL (ref 70–99)
Glucose-Capillary: 130 mg/dL — ABNORMAL HIGH (ref 70–99)

## 2020-02-20 LAB — TYPE AND SCREEN
ABO/RH(D): O POS
Antibody Screen: NEGATIVE

## 2020-02-20 LAB — RPR: RPR Ser Ql: NONREACTIVE

## 2020-02-20 MED ORDER — TERBUTALINE SULFATE 1 MG/ML IJ SOLN
0.2500 mg | Freq: Once | INTRAMUSCULAR | Status: DC | PRN
  Filled 2020-02-20: qty 1

## 2020-02-20 MED ORDER — EPHEDRINE 5 MG/ML INJ
10.0000 mg | INTRAVENOUS | Status: DC | PRN
  Filled 2020-02-20: qty 10

## 2020-02-20 MED ORDER — LACTATED RINGERS IV SOLN
500.0000 mL | INTRAVENOUS | Status: DC | PRN
  Administered 2020-02-20: 250 mL via INTRAVENOUS
  Administered 2020-02-20: 500 mL via INTRAVENOUS
  Administered 2020-02-21: 250 mL via INTRAVENOUS

## 2020-02-20 MED ORDER — SODIUM CHLORIDE (PF) 0.9 % IJ SOLN
INTRAMUSCULAR | Status: DC | PRN
  Administered 2020-02-20: 12 mL/h via EPIDURAL

## 2020-02-20 MED ORDER — LIDOCAINE HCL (PF) 1 % IJ SOLN: 30.0000 mL | INTRAMUSCULAR | Status: DC | PRN

## 2020-02-20 MED ORDER — HYDRALAZINE HCL 20 MG/ML IJ SOLN: 10.0000 mg | INTRAMUSCULAR | Status: DC | PRN

## 2020-02-20 MED ORDER — EPHEDRINE 5 MG/ML INJ
10.0000 mg | INTRAVENOUS | Status: DC | PRN
  Administered 2020-02-20: 10 mg via INTRAVENOUS

## 2020-02-20 MED ORDER — LACTATED RINGERS IV SOLN
INTRAVENOUS | Status: DC
  Administered 2020-02-20: 125 mL/h via INTRAVENOUS
  Administered 2020-02-21: 20 mL/h via INTRAVENOUS

## 2020-02-20 MED ORDER — PHENYLEPHRINE 40 MCG/ML (10ML) SYRINGE FOR IV PUSH (FOR BLOOD PRESSURE SUPPORT)
80.0000 ug | PREFILLED_SYRINGE | INTRAVENOUS | Status: AC | PRN
  Administered 2020-02-20 (×3): 80 ug via INTRAVENOUS

## 2020-02-20 MED ORDER — TERBUTALINE SULFATE 1 MG/ML IJ SOLN
0.2500 mg | Freq: Once | INTRAMUSCULAR | Status: AC | PRN
  Administered 2020-02-21: 0.25 mg via SUBCUTANEOUS

## 2020-02-20 MED ORDER — OXYCODONE-ACETAMINOPHEN 5-325 MG PO TABS: 2.0000 | ORAL_TABLET | ORAL | Status: DC | PRN

## 2020-02-20 MED ORDER — FENTANYL-BUPIVACAINE-NACL 0.5-0.125-0.9 MG/250ML-% EP SOLN: 12.0000 mL/h | EPIDURAL | Status: DC | PRN

## 2020-02-20 MED ORDER — PHENYLEPHRINE 40 MCG/ML (10ML) SYRINGE FOR IV PUSH (FOR BLOOD PRESSURE SUPPORT)
80.0000 ug | PREFILLED_SYRINGE | INTRAVENOUS | Status: DC | PRN
  Filled 2020-02-20: qty 10

## 2020-02-20 MED ORDER — LACTATED RINGERS IV SOLN
500.0000 mL | Freq: Once | INTRAVENOUS | Status: AC
  Administered 2020-02-20: 500 mL via INTRAVENOUS

## 2020-02-20 MED ORDER — LABETALOL HCL 5 MG/ML IV SOLN: 40.0000 mg | INTRAVENOUS | Status: DC | PRN

## 2020-02-20 MED ORDER — OXYTOCIN BOLUS FROM INFUSION: 333.0000 mL | Freq: Once | INTRAVENOUS | Status: DC

## 2020-02-20 MED ORDER — OXYTOCIN-SODIUM CHLORIDE 30-0.9 UT/500ML-% IV SOLN
1.0000 m[IU]/min | INTRAVENOUS | Status: DC
  Administered 2020-02-20: 4 m[IU]/min via INTRAVENOUS
  Administered 2020-02-20: 2 m[IU]/min via INTRAVENOUS
  Administered 2020-02-21: 10 m[IU]/min via INTRAVENOUS
  Filled 2020-02-20: qty 500

## 2020-02-20 MED ORDER — EPHEDRINE 5 MG/ML INJ: 10.0000 mg | INTRAVENOUS | Status: DC | PRN

## 2020-02-20 MED ORDER — LIDOCAINE HCL (PF) 1 % IJ SOLN
INTRAMUSCULAR | Status: DC | PRN
  Administered 2020-02-20: 5 mL via EPIDURAL

## 2020-02-20 MED ORDER — SOD CITRATE-CITRIC ACID 500-334 MG/5ML PO SOLN: 30.0000 mL | ORAL | Status: DC | PRN

## 2020-02-20 MED ORDER — LABETALOL HCL 5 MG/ML IV SOLN: 80.0000 mg | INTRAVENOUS | Status: DC | PRN

## 2020-02-20 MED ORDER — ACETAMINOPHEN 500 MG PO TABS
1000.0000 mg | ORAL_TABLET | Freq: Four times a day (QID) | ORAL | Status: DC | PRN
  Administered 2020-02-20 – 2020-02-21 (×3): 1000 mg via ORAL
  Filled 2020-02-20 (×3): qty 2

## 2020-02-20 MED ORDER — PHENYLEPHRINE 40 MCG/ML (10ML) SYRINGE FOR IV PUSH (FOR BLOOD PRESSURE SUPPORT): 80.0000 ug | PREFILLED_SYRINGE | INTRAVENOUS | Status: DC | PRN

## 2020-02-20 MED ORDER — OXYTOCIN-SODIUM CHLORIDE 30-0.9 UT/500ML-% IV SOLN: 2.5000 [IU]/h | INTRAVENOUS | Status: DC

## 2020-02-20 MED ORDER — ONDANSETRON HCL 4 MG/2ML IJ SOLN
4.0000 mg | Freq: Four times a day (QID) | INTRAMUSCULAR | Status: DC | PRN
  Administered 2020-02-20: 4 mg via INTRAVENOUS
  Filled 2020-02-20: qty 2

## 2020-02-20 MED ORDER — FENTANYL-BUPIVACAINE-NACL 0.5-0.125-0.9 MG/250ML-% EP SOLN
12.0000 mL/h | EPIDURAL | Status: DC | PRN
  Filled 2020-02-20: qty 250

## 2020-02-20 MED ORDER — FENTANYL CITRATE (PF) 100 MCG/2ML IJ SOLN
100.0000 ug | INTRAMUSCULAR | Status: DC | PRN
  Administered 2020-02-20: 100 ug via INTRAVENOUS
  Filled 2020-02-20: qty 2

## 2020-02-20 MED ORDER — ACETAMINOPHEN 325 MG PO TABS: 650.0000 mg | ORAL_TABLET | ORAL | Status: DC | PRN

## 2020-02-20 MED ORDER — MAGNESIUM SULFATE BOLUS VIA INFUSION
4.0000 g | Freq: Once | INTRAVENOUS | Status: AC
  Administered 2020-02-20: 4 g via INTRAVENOUS
  Filled 2020-02-20: qty 1000

## 2020-02-20 MED ORDER — ALBUMIN HUMAN 5 % IV SOLN
12.5000 g | Freq: Once | INTRAVENOUS | Status: AC
  Administered 2020-02-20: 12.5 g via INTRAVENOUS
  Filled 2020-02-20: qty 250

## 2020-02-20 MED ORDER — LACTATED RINGERS IV SOLN
500.0000 mL | Freq: Once | INTRAVENOUS | Status: AC
  Administered 2020-02-20: 250 mL via INTRAVENOUS

## 2020-02-20 MED ORDER — PENICILLIN G POT IN DEXTROSE 60000 UNIT/ML IV SOLN
3.0000 10*6.[IU] | INTRAVENOUS | Status: DC
  Administered 2020-02-20 – 2020-02-21 (×7): 3 10*6.[IU] via INTRAVENOUS
  Filled 2020-02-20 (×7): qty 50

## 2020-02-20 MED ORDER — MISOPROSTOL 25 MCG QUARTER TABLET
25.0000 ug | ORAL_TABLET | ORAL | Status: DC | PRN
  Administered 2020-02-20 (×2): 25 ug via VAGINAL
  Filled 2020-02-20 (×2): qty 1

## 2020-02-20 MED ORDER — SODIUM CHLORIDE 0.9 % IV SOLN
5.0000 10*6.[IU] | Freq: Once | INTRAVENOUS | Status: AC
  Administered 2020-02-20: 5 10*6.[IU] via INTRAVENOUS
  Filled 2020-02-20: qty 5

## 2020-02-20 MED ORDER — LABETALOL HCL 5 MG/ML IV SOLN
20.0000 mg | INTRAVENOUS | Status: DC | PRN
  Administered 2020-02-20: 20 mg via INTRAVENOUS
  Filled 2020-02-20: qty 4

## 2020-02-20 MED ORDER — MAGNESIUM SULFATE 40 GM/1000ML IV SOLN
2.0000 g/h | INTRAVENOUS | Status: DC
  Administered 2020-02-20 – 2020-02-21 (×2): 2 g/h via INTRAVENOUS
  Filled 2020-02-20 (×2): qty 1000

## 2020-02-20 MED ORDER — OXYCODONE-ACETAMINOPHEN 5-325 MG PO TABS: 1.0000 | ORAL_TABLET | ORAL | Status: DC | PRN

## 2020-02-20 MED ORDER — DIPHENHYDRAMINE HCL 50 MG/ML IJ SOLN: 12.5000 mg | INTRAMUSCULAR | Status: DC | PRN

## 2020-02-20 MED ORDER — ALBUMIN HUMAN 5 % IV SOLN
INTRAVENOUS | Status: AC
  Filled 2020-02-20: qty 250

## 2020-02-20 NOTE — Progress Notes (Signed)
LABOR PROGRESS NOTE  Melissa Medina is a 49 y.o. [name] at [redacted]w[redacted]d  admitted for IOL for PEC with out SF initially, then developed SF intrapartum. Currently on Magnesium, also received IV labetalol and tylenol. Patient reports that HA is resolved at this time.   Subjective: Patient comfortable with epidural, not feel any pressure or pain currently.   Objective: BP 124/81   Pulse 81   Temp 97.9 F (36.6 C) (Oral)   Resp 17   Ht 5\' 5"  (1.651 m)   Wt 105.5 kg   LMP 06/05/2019   SpO2 97%   BMI 38.70 kg/m  or  Vitals:   02/20/20 2121 02/20/20 2126 02/20/20 2131 02/20/20 2136  BP: 119/75 111/89 125/75 124/81  Pulse: 73 82 82 81  Resp:      Temp:      TempSrc:      SpO2:      Weight:      Height:        Pitocin currently off due to decelerations after epidural d/t hypotension, plan to restart reactive tracing Dilation: 4.5 Effacement (%): 60 Cervical Position: Posterior Station: -1 Presentation: Vertex Exam by:: West Pugh, RN FHT: baseline rate 140, moderate varibility, +accel, variable decel  Toco: 4-6   Labs: Lab Results  Component Value Date   WBC 8.4 02/20/2020   HGB 10.6 (L) 02/20/2020   HCT 32.7 (L) 02/20/2020   MCV 86.3 02/20/2020   PLT 213 02/20/2020    Patient Active Problem List   Diagnosis Date Noted  . Gestational hypertension 01/14/2020  . Gestational diabetes 01/05/2020  . Pregnancy resulting from in vitro fertilization 08/25/2019  . Group B streptococcal bacteriuria 08/25/2019  . Obesity 08/25/2019  . Acute nonintractable headache 08/17/2019  . Bilateral groin pain 08/17/2019  . Supervision of high risk pregnancy, antepartum 08/16/2019  . AMA (advanced maternal age) multigravida 35+ 08/16/2019  . Bilateral fibrocystic breast changes 03/23/2019  . Depression 03/23/2019  . Chronic low back pain 03/23/2019  . Gastroesophageal reflux disease 03/23/2019  . History of cervical dysplasia 03/23/2019  . Restless legs syndrome 03/23/2019  . Sleep  apnea 03/23/2019  . Vitamin D deficiency 03/23/2019  . Posttraumatic stress disorder 03/23/2019  . Anxiety 11/21/2017  . Fibromyalgia 11/21/2017  . Pain syndrome, chronic 01/08/2017  . Uterine fibroid 03/25/2008    Assessment / Plan: 49 y.o. [name] at [redacted]w[redacted]d here for IOL   Labor: Minimal progression, plan of care to turn pitocin on and titrate to active labor. Plan to recheck cervix in 2 hours with possible AROM and IUPC.  Fetal Wellbeing:  Cat II  Pain Control:  Epidural  Anticipated MOD:  SVD  Lajean Manes, CNM 02/20/2020, 9:42 PM

## 2020-02-20 NOTE — H&P (Signed)
Melissa Medina is a 49 y.o. G56P2002 female at 16w0dby embryo transfer on 06/24/19 presenting for IOL d/t pre-eclampsia w/o severe features.  Mild-mod headache, rates 7/10, hasn't taken anything for it. Denies visual changes, ruq/epigastric pain, n/v. Headaches entire pregnancy, more frequent lately, usually go away w/ apap.  Reports active fetal movement, contractions: irregular, vaginal bleeding: none, membranes: intact.  [facility]   Most recent u/s 11/22 @ 36.1wks, EFW 50% (2841g), AFI 10.84, vtx.   This pregnancy complicated by: AMA w/ IVF Pre-e w/o severe features (P:C ratio .093814d ago) GBS bacteruria A2DM on metformin 5020mBID PTSD  Prenatal History/Complications:  [id number] uncomplicated SVB, [id number] uncomplicated SVB, 341025EPast Medical History: Past Medical History:  Diagnosis Date  . Abnormal uterine bleeding 03/23/2019  . Anemia   . Anxiety   . Bilateral plantar fasciitis   . Chronic headaches   . Fibroid   . Fibromyalgia   . GERD (gastroesophageal reflux disease)   . Gestational diabetes   . Herniated disc, cervical   . History of reduction mammoplasty 03/23/2019  . Iron deficiency anemia due to chronic blood loss 03/23/2019  . Menometrorrhagia 03/23/2019  . Migraine   . Patellofemoral syndrome of left knee   . Patellofemoral syndrome of right knee   . Pregnancy induced hypertension   . PTSD (post-traumatic stress disorder)   . Pyelonephritis 03/23/2019  . Sleep apnea     Past Surgical History: Past Surgical History:  Procedure Laterality Date  . APPENDECTOMY     childhood  . BREAST REDUCTION SURGERY  2009  . CHOLECYSTECTOMY    . DILATION AND CURETTAGE OF UTERUS    . LIPOSUCTION MULTIPLE BODY PARTS    . TONSILLECTOMY     childhood    Obstetrical History: OB History    Gravida  3   Para  2   Term  2   Preterm      AB      Living  2     SAB      TAB      Ectopic      Multiple       Live Births  2          Allergies: No Known Allergies  Medications Prior to Admission  Medication Sig Dispense Refill Last Dose  . aspirin EC 81 MG tablet Take 1 tablet (81 mg total) by mouth daily. Take after 12 weeks for prevention of preeclampsia later in pregnancy 300 tablet 2 02/19/2020 at 1600  . Calcium Carbonate-Vit D-Min (CALCIUM 1200 PO) Take by mouth.   02/19/2020 at 1600  . ferrous sulfate 325 (65 FE) MG EC tablet Take 325 mg by mouth every other day.   02/19/2020 at 1600  . glucose blood (FREESTYLE TEST STRIPS) test strip 1 each by Other route in the morning, at noon, in the evening, and at bedtime. 100 strip 2 02/19/2020 at 1600  . glucose monitoring kit (FREESTYLE) monitoring kit 1 each by Does not apply route as needed for other. 1 each 0 02/19/2020 at 1600  . Lancets (FREESTYLE) lancets Use to check blood sugars 4 times per day (fasting & 2 hours after each meal) 120 each 1 02/19/2020 at 1600  . metFORMIN (GLUCOPHAGE) 500 MG tablet Take 1 tablet (500 mg total) by mouth 2 (two) times daily with a meal. 60 tablet 5 02/19/2020 at 1900  . Prenatal Vit-Fe Fumarate-FA (PRENATAL MULTIVITAMIN) TABS tablet Take  1 tablet by mouth daily at 12 noon.   02/19/2020 at 1600  . vitamin B-12 (CYANOCOBALAMIN) 500 MCG tablet Take 1,000 mcg by mouth daily.   02/19/2020 at 1600  . vitamin C (ASCORBIC ACID) 250 MG tablet Take 250 mg by mouth every other day.    02/19/2020 at 1600  . acetaminophen (TYLENOL) 325 MG tablet Take 650 mg by mouth every 6 (six) hours as needed.     . pantoprazole (PROTONIX) 40 MG tablet Take 1 tablet (40 mg total) by mouth daily. 30 tablet 3     Review of Systems  Pertinent pos/neg as indicated in HPI  Blood pressure (!) 155/91, pulse 84, temperature 98.2 F (36.8 C), temperature source Oral, resp. rate 16, height '5\' 5"'  (1.651 m), weight 105.5 kg, last menstrual period 06/05/2019, unknown if currently breastfeeding. General appearance: alert, cooperative and no  distress Lungs: clear to auscultation bilaterally Heart: regular rate and rhythm Abdomen: gravid, soft, non-tender, large incision from hip to hip- states she had a tummy tuck. Dependent edema all below incision in striae formation Extremities: tr edema DTR's 2+  Fetal monitoring: FHR: 135 bpm, variability: moderate,  Accelerations: Present,  decelerations:  Absent Uterine activity: irregular Dilation: Closed Effacement (%): Thick Station: -3 Exam by:: Nadara Mustard, RN Presentation: cephalic by bedside u/s by me   Prenatal labs: ABO, Rh: --/--/O POS (11/28 0730) Antibody: NEG (11/28 0730) Rubella: 7.85 (06/02 1226) RPR: Non Reactive (10/12 0821)  HBsAg: Negative (11/24 1557)  HIV: Non Reactive (10/12 0821)  GBS:   POS bacteruria 2hr GTT: 87/177/173  Prenatal Transfer Tool  Maternal Diabetes: Yes:  Diabetes Type:  Insulin/Medication controlled Genetic Screening: Normal Maternal Ultrasounds/Referrals: Normal Fetal Ultrasounds or other Referrals:  None Maternal Substance Abuse:  No Significant Maternal Medications:  None Significant Maternal Lab Results: Group B Strep positive  Results for orders placed or performed during the hospital encounter of 02/20/20 (from the past 24 hour(s))  CBC   Collection Time: 02/20/20  7:30 AM  Result Value Ref Range   WBC 7.4 4.0 - 10.5 K/uL   RBC 3.92 3.87 - 5.11 MIL/uL   Hemoglobin 11.1 (L) 12.0 - 15.0 g/dL   HCT 34.1 (L) 36 - 46 %   MCV 87.0 80.0 - 100.0 fL   MCH 28.3 26.0 - 34.0 pg   MCHC 32.6 30.0 - 36.0 g/dL   RDW 15.1 11.5 - 15.5 %   Platelets 226 150 - 400 K/uL   nRBC 0.0 0.0 - 0.2 %  Comprehensive metabolic panel   Collection Time: 02/20/20  7:30 AM  Result Value Ref Range   Sodium 137 135 - 145 mmol/L   Potassium 3.6 3.5 - 5.1 mmol/L   Chloride 106 98 - 111 mmol/L   CO2 20 (L) 22 - 32 mmol/L   Glucose, Bld 81 70 - 99 mg/dL   BUN 8 6 - 20 mg/dL   Creatinine, Ser 0.57 0.44 - 1.00 mg/dL   Calcium 8.6 (L) 8.9 - 10.3  mg/dL   Total Protein 6.0 (L) 6.5 - 8.1 g/dL   Albumin 2.4 (L) 3.5 - 5.0 g/dL   AST 28 15 - 41 U/L   ALT 34 0 - 44 U/L   Alkaline Phosphatase 151 (H) 38 - 126 U/L   Total Bilirubin 0.7 0.3 - 1.2 mg/dL   GFR, Estimated >60 >60 mL/min   Anion gap 11 5 - 15  Type and screen   Collection Time: 02/20/20  7:30 AM  Result Value  Ref Range   ABO/RH(D) O POS    Antibody Screen NEG    Sample Expiration      02/23/2020,2359 Performed at [facility], [place] [address] [place], [place] [address]   Glucose, capillary   Collection Time: 02/20/20  8:07 AM  Result Value Ref Range   Glucose-Capillary 73 70 - 99 mg/dL     Assessment:  51w0dSIUP  [name]  IOL for pre-e w/o severe features  AMA  A2DM  Cat 1 FHR  GBS  Pos  Plan:  Admit to L&D  IV pain meds/epidural prn active labor  Cytotec then foley bulb when able  Mild-mod headache, will treat w/ apap and monitor  CBGs q4hr IOL/late phase, then q2hr active phase  PCN per protocol  Anticipate NSVB   Plans to breastfeed  Contraception: none  Circumcision: yes  KRoma SchanzCNM, WHNP-BC 02/20/2020, 9:24 AM

## 2020-02-20 NOTE — Progress Notes (Signed)
LABOR PROGRESS NOTE  Melissa Medina is a 49 y.o. [name] at [redacted]w[redacted]d  admitted for IOL 2/2 pre-E w/o severe features.   Subjective: Doing well. Resting comfortably.   Objective: BP 129/72   Pulse 86   Temp 98.3 F (36.8 C) (Oral)   Resp 16   Ht 5\' 5"  (1.651 m)   Wt 105.5 kg   LMP 06/05/2019   BMI 38.70 kg/m  or  Vitals:   02/20/20 1000 02/20/20 1100 02/20/20 1200 02/20/20 1300  BP: (!) 152/96 (!) 147/93 124/70 129/72  Pulse: 82 85 79 86  Resp:   16 16  Temp:    98.3 F (36.8 C)  TempSrc:    Oral  Weight:      Height:       Dilation: 1 Effacement (%): 30 Cervical Position: Posterior Station: -3 Presentation: Vertex Exam by:: Nadara Mustard, RN FHT: baseline rate 130 bpm, moderate varibility, 15 x 15 acel, no decel Toco: 2-6 mins  Labs: Lab Results  Component Value Date   WBC 7.4 02/20/2020   HGB 11.1 (L) 02/20/2020   HCT 34.1 (L) 02/20/2020   MCV 87.0 02/20/2020   PLT 226 02/20/2020    Patient Active Problem List   Diagnosis Date Noted  . Gestational hypertension 01/14/2020  . Gestational diabetes 01/05/2020  . Pregnancy resulting from in vitro fertilization 08/25/2019  . Group B streptococcal bacteriuria 08/25/2019  . Obesity 08/25/2019  . Acute nonintractable headache 08/17/2019  . Bilateral groin pain 08/17/2019  . Supervision of high risk pregnancy, antepartum 08/16/2019  . AMA (advanced maternal age) multigravida 35+ 08/16/2019  . Bilateral fibrocystic breast changes 03/23/2019  . Depression 03/23/2019  . Chronic low back pain 03/23/2019  . Gastroesophageal reflux disease 03/23/2019  . History of cervical dysplasia 03/23/2019  . Restless legs syndrome 03/23/2019  . Sleep apnea 03/23/2019  . Vitamin D deficiency 03/23/2019  . Posttraumatic stress disorder 03/23/2019  . Anxiety 11/21/2017  . Fibromyalgia 11/21/2017  . Pain syndrome, chronic 01/08/2017  . Uterine fibroid 03/25/2008    Assessment / Plan: 49 y.o. [name] at [redacted]w[redacted]d here for IOL  2/2 pre-E w/o SF.  Pre-E w/o SF Intermittent headache resolved with tylenol now.  BPs wnl. Continue to monitor.  A2GDM Most recent CBG 82 CBG monitoring q4h during latent labor.   Labor: s/p cytotec x1. FB placed. Consider starting pitocin when appropriate.  Fetal Wellbeing: Cat I Pain Control:  Maternally supported, consider IV pain medication when needed.  Anticipated MOD: Vaginal  [name], DO 02/20/2020, 1:59 PM PGY-2, [place]

## 2020-02-20 NOTE — Anesthesia Preprocedure Evaluation (Signed)
Anesthesia Evaluation  Patient identified by MRN, date of birth, ID band Patient awake    Reviewed: Allergy & Precautions, Patient's Chart, lab work & pertinent test results  Airway Mallampati: II       Dental   Pulmonary sleep apnea ,    Pulmonary exam normal        Cardiovascular hypertension, Normal cardiovascular exam     Neuro/Psych  Headaches, PSYCHIATRIC DISORDERS Anxiety Depression    GI/Hepatic GERD  ,  Endo/Other  diabetes, Gestational  Renal/GU      Musculoskeletal  (+) Fibromyalgia -  Abdominal   Peds  Hematology   Anesthesia Other Findings   Reproductive/Obstetrics (+) Pregnancy                            Anesthesia Physical Anesthesia Plan  ASA: III  Anesthesia Plan: Epidural   Post-op Pain Management:    Induction:   PONV Risk Score and Plan:   Airway Management Planned: Natural Airway  Additional Equipment: None  Intra-op Plan:   Post-operative Plan:   Informed Consent: I have reviewed the patients History and Physical, chart, labs and discussed the procedure including the risks, benefits and alternatives for the proposed anesthesia with the patient or authorized representative who has indicated his/her understanding and acceptance.       Plan Discussed with:   Anesthesia Plan Comments: (Lab Results      Component                Value               Date                      WBC                      8.4                 02/20/2020                HGB                      10.6 (L)            02/20/2020                HCT                      32.7 (L)            02/20/2020                MCV                      86.3                02/20/2020                PLT                      213                 02/20/2020           )       Anesthesia Quick Evaluation

## 2020-02-20 NOTE — Progress Notes (Signed)
LABOR PROGRESS NOTE  Stacia Feazell is a 49 y.o. [name] at [redacted]w[redacted]d  admitted for  IOL 2/2 pre-E w/o severe features.  Subjective: Endorsing headache. Denies vision changes, RUQ pain.   Objective: BP (!) 148/77   Pulse 87   Temp 98.3 F (36.8 C) (Oral)   Resp 18   Ht 5\' 5"  (1.651 m)   Wt 105.5 kg   LMP 06/05/2019   BMI 38.70 kg/m  or  Vitals:   02/20/20 1500 02/20/20 1506 02/20/20 1517 02/20/20 1602  BP: (!) 162/96 (!) 162/96 (!) 138/95 (!) 148/77  Pulse:  79 87 87  Resp:  20 20 18   Temp:      TempSrc:      Weight:      Height:       Dilation: 4 Effacement (%): 50 Cervical Position: Posterior Station: -2 Presentation: Vertex Exam by:: Davina Galloway,RN FHT: baseline rate 125 bpm, moderate varibility, 15 x 15 acel, no decel Toco: 3 mins  Labs: Lab Results  Component Value Date   WBC 8.4 02/20/2020   HGB 10.6 (L) 02/20/2020   HCT 32.7 (L) 02/20/2020   MCV 86.3 02/20/2020   PLT 213 02/20/2020    Patient Active Problem List   Diagnosis Date Noted  . Gestational hypertension 01/14/2020  . Gestational diabetes 01/05/2020  . Pregnancy resulting from in vitro fertilization 08/25/2019  . Group B streptococcal bacteriuria 08/25/2019  . Obesity 08/25/2019  . Acute nonintractable headache 08/17/2019  . Bilateral groin pain 08/17/2019  . Supervision of high risk pregnancy, antepartum 08/16/2019  . AMA (advanced maternal age) multigravida 35+ 08/16/2019  . Bilateral fibrocystic breast changes 03/23/2019  . Depression 03/23/2019  . Chronic low back pain 03/23/2019  . Gastroesophageal reflux disease 03/23/2019  . History of cervical dysplasia 03/23/2019  . Restless legs syndrome 03/23/2019  . Sleep apnea 03/23/2019  . Vitamin D deficiency 03/23/2019  . Posttraumatic stress disorder 03/23/2019  . Anxiety 11/21/2017  . Fibromyalgia 11/21/2017  . Pain syndrome, chronic 01/08/2017  . Uterine fibroid 03/25/2008    Assessment / Plan: 49 y.o. [name] at [redacted]w[redacted]d here  for IOL 2/2 pre-E w/ SF.  Pre-E w/ SF Endorsing headache- repeat tylenol 1 g BPs continue to elevate, intermittent severe range pressure readings. Start mag and IV labetalol  A2GDM Most recent CBG 75 CBG monitoring q4h during latent labor.  Labor: s/p FB, cytotec. Start pitocin when appropriate.  Fetal Wellbeing:  Cat I  Pain Control:  Maternally supported. Planning for epidural  Anticipated MOD: Vaginal  [name], DO 02/20/2020, 6:22 PM PGY-2, [place]

## 2020-02-20 NOTE — Anesthesia Procedure Notes (Signed)
Epidural Patient location during procedure: OB Start time: 02/20/2020 8:25 PM End time: 02/20/2020 8:31 PM  Staffing Anesthesiologist: Effie Berkshire, MD Performed: anesthesiologist   Preanesthetic Checklist Completed: patient identified, IV checked, site marked, risks and benefits discussed, surgical consent, monitors and equipment checked, pre-op evaluation and timeout performed  Epidural Patient position: sitting Prep: DuraPrep Patient monitoring: heart rate, continuous pulse ox and blood pressure Approach: midline Location: L3-L4 Injection technique: LOR saline  Needle:  Needle type: Tuohy  Needle gauge: 17 G Needle length: 9 cm Catheter type: closed end flexible Catheter size: 20 Guage Test dose: negative and 1.5% lidocaine  Assessment Events: blood not aspirated, injection not painful, no injection resistance and no paresthesia  Additional Notes LOR @ 6  Patient identified. Risks/Benefits/Options discussed with patient including but not limited to bleeding, infection, nerve damage, paralysis, failed block, incomplete pain control, headache, blood pressure changes, nausea, vomiting, reactions to medications, itching and postpartum back pain. Confirmed with bedside nurse the patient's most recent platelet count. Confirmed with patient that they are not currently taking any anticoagulation, have any bleeding history or any family history of bleeding disorders. Patient expressed understanding and wished to proceed. All questions were answered. Sterile technique was used throughout the entire procedure. Please see nursing notes for vital signs. Test dose was given through epidural catheter and negative prior to continuing to dose epidural or start infusion. Warning signs of high block given to the patient including shortness of breath, tingling/numbness in hands, complete motor block, or any concerning symptoms with instructions to call for help. Patient was given instructions on  fall risk and not to get out of bed. All questions and concerns addressed with instructions to call with any issues or inadequate analgesia.    Reason for block:procedure for pain

## 2020-02-21 ENCOUNTER — Encounter (HOSPITAL_COMMUNITY): Payer: Self-pay | Admitting: Family Medicine

## 2020-02-21 ENCOUNTER — Encounter: Payer: Self-pay | Admitting: Obstetrics and Gynecology

## 2020-02-21 DIAGNOSIS — R7401 Elevation of levels of liver transaminase levels: Secondary | ICD-10-CM | POA: Insufficient documentation

## 2020-02-21 DIAGNOSIS — O1414 Severe pre-eclampsia complicating childbirth: Secondary | ICD-10-CM

## 2020-02-21 DIAGNOSIS — Z3A37 37 weeks gestation of pregnancy: Secondary | ICD-10-CM

## 2020-02-21 DIAGNOSIS — O99824 Streptococcus B carrier state complicating childbirth: Secondary | ICD-10-CM

## 2020-02-21 DIAGNOSIS — O134 Gestational [pregnancy-induced] hypertension without significant proteinuria, complicating childbirth: Secondary | ICD-10-CM

## 2020-02-21 LAB — CBC
HCT: 34 % — ABNORMAL LOW (ref 36.0–46.0)
Hemoglobin: 11 g/dL — ABNORMAL LOW (ref 12.0–15.0)
MCH: 28.2 pg (ref 26.0–34.0)
MCHC: 32.4 g/dL (ref 30.0–36.0)
MCV: 87.2 fL (ref 80.0–100.0)
Platelets: 212 10*3/uL (ref 150–400)
RBC: 3.9 MIL/uL (ref 3.87–5.11)
RDW: 15.1 % (ref 11.5–15.5)
WBC: 11.4 10*3/uL — ABNORMAL HIGH (ref 4.0–10.5)
nRBC: 0 % (ref 0.0–0.2)

## 2020-02-21 LAB — COMPREHENSIVE METABOLIC PANEL
ALT: 42 U/L (ref 0–44)
AST: 36 U/L (ref 15–41)
Albumin: 2.4 g/dL — ABNORMAL LOW (ref 3.5–5.0)
Alkaline Phosphatase: 160 U/L — ABNORMAL HIGH (ref 38–126)
Anion gap: 11 (ref 5–15)
BUN: 5 mg/dL — ABNORMAL LOW (ref 6–20)
CO2: 21 mmol/L — ABNORMAL LOW (ref 22–32)
Calcium: 7.7 mg/dL — ABNORMAL LOW (ref 8.9–10.3)
Chloride: 101 mmol/L (ref 98–111)
Creatinine, Ser: 0.72 mg/dL (ref 0.44–1.00)
GFR, Estimated: >60 mL/min (ref >60)
Glucose, Bld: 98 mg/dL (ref 70–99)
Potassium: 3.8 mmol/L (ref 3.5–5.1)
Sodium: 133 mmol/L — ABNORMAL LOW (ref 135–145)
Total Bilirubin: 0.9 mg/dL (ref 0.3–1.2)
Total Protein: 5.5 g/dL — ABNORMAL LOW (ref 6.5–8.1)

## 2020-02-21 LAB — GLUCOSE, CAPILLARY
Glucose-Capillary: 100 mg/dL — ABNORMAL HIGH (ref 70–99)
Glucose-Capillary: 105 mg/dL — ABNORMAL HIGH (ref 70–99)
Glucose-Capillary: 109 mg/dL — ABNORMAL HIGH (ref 70–99)
Glucose-Capillary: 93 mg/dL (ref 70–99)

## 2020-02-21 MED ORDER — TRANEXAMIC ACID-NACL 1000-0.7 MG/100ML-% IV SOLN
1000.0000 mg | INTRAVENOUS | Status: AC
  Administered 2020-02-21: 1000 mg via INTRAVENOUS

## 2020-02-21 MED ORDER — MAGNESIUM SULFATE 40 GM/1000ML IV SOLN
2.0000 g/h | INTRAVENOUS | Status: AC
  Administered 2020-02-22: 2 g/h via INTRAVENOUS
  Filled 2020-02-21: qty 1000

## 2020-02-21 MED ORDER — DIBUCAINE (PERIANAL) 1 % EX OINT: 1.0000 "application " | TOPICAL_OINTMENT | CUTANEOUS | Status: DC | PRN

## 2020-02-21 MED ORDER — SIMETHICONE 80 MG PO CHEW: 80.0000 mg | CHEWABLE_TABLET | ORAL | Status: DC | PRN

## 2020-02-21 MED ORDER — OXYCODONE HCL 5 MG PO TABS: 10.0000 mg | ORAL_TABLET | ORAL | Status: DC | PRN

## 2020-02-21 MED ORDER — ONDANSETRON HCL 4 MG/2ML IJ SOLN: 4.0000 mg | INTRAMUSCULAR | Status: DC | PRN

## 2020-02-21 MED ORDER — LACTATED RINGERS IV SOLN: INTRAVENOUS | Status: DC

## 2020-02-21 MED ORDER — COCONUT OIL OIL: 1.0000 "application " | TOPICAL_OIL | Status: DC | PRN

## 2020-02-21 MED ORDER — LACTATED RINGERS IV BOLUS: 1000.0000 mL | Freq: Once | INTRAVENOUS | Status: DC

## 2020-02-21 MED ORDER — POLYETHYLENE GLYCOL 3350 17 G PO PACK
17.0000 g | PACK | Freq: Every day | ORAL | Status: DC
  Administered 2020-02-22 – 2020-02-23 (×2): 17 g via ORAL
  Filled 2020-02-21 (×2): qty 1

## 2020-02-21 MED ORDER — WITCH HAZEL-GLYCERIN EX PADS: 1.0000 "application " | MEDICATED_PAD | CUTANEOUS | Status: DC | PRN

## 2020-02-21 MED ORDER — PRENATAL MULTIVITAMIN CH
1.0000 | ORAL_TABLET | Freq: Every day | ORAL | Status: DC
  Administered 2020-02-23: 1 via ORAL
  Filled 2020-02-21: qty 1

## 2020-02-21 MED ORDER — ONDANSETRON HCL 4 MG PO TABS: 4.0000 mg | ORAL_TABLET | ORAL | Status: DC | PRN

## 2020-02-21 MED ORDER — OXYCODONE HCL 5 MG PO TABS: 5.0000 mg | ORAL_TABLET | ORAL | Status: DC | PRN

## 2020-02-21 MED ORDER — BENZOCAINE-MENTHOL 20-0.5 % EX AERO: 1.0000 "application " | INHALATION_SPRAY | CUTANEOUS | Status: DC | PRN

## 2020-02-21 MED ORDER — ACETAMINOPHEN 325 MG PO TABS
650.0000 mg | ORAL_TABLET | Freq: Four times a day (QID) | ORAL | Status: DC
  Administered 2020-02-21 – 2020-02-23 (×6): 650 mg via ORAL
  Filled 2020-02-21 (×7): qty 2

## 2020-02-21 MED ORDER — LACTATED RINGERS AMNIOINFUSION
INTRAVENOUS | Status: DC
  Administered 2020-02-21: 300 mL/h via INTRAUTERINE

## 2020-02-21 MED ORDER — TRANEXAMIC ACID-NACL 1000-0.7 MG/100ML-% IV SOLN
INTRAVENOUS | Status: AC
  Filled 2020-02-21: qty 100

## 2020-02-21 MED ORDER — IBUPROFEN 600 MG PO TABS
600.0000 mg | ORAL_TABLET | Freq: Four times a day (QID) | ORAL | Status: DC
  Administered 2020-02-21 – 2020-02-23 (×7): 600 mg via ORAL
  Filled 2020-02-21 (×8): qty 1

## 2020-02-21 MED ORDER — DIPHENHYDRAMINE HCL 25 MG PO CAPS: 25.0000 mg | ORAL_CAPSULE | Freq: Four times a day (QID) | ORAL | Status: DC | PRN

## 2020-02-21 MED ORDER — LACTATED RINGERS IV BOLUS: 1000.0000 mL | INTRAVENOUS | Status: DC

## 2020-02-21 MED ORDER — TETANUS-DIPHTH-ACELL PERTUSSIS 5-2.5-18.5 LF-MCG/0.5 IM SUSY: 0.5000 mL | PREFILLED_SYRINGE | Freq: Once | INTRAMUSCULAR | Status: DC

## 2020-02-21 MED ORDER — SENNOSIDES-DOCUSATE SODIUM 8.6-50 MG PO TABS
2.0000 | ORAL_TABLET | ORAL | Status: DC
  Administered 2020-02-21 – 2020-02-23 (×2): 2 via ORAL
  Filled 2020-02-21 (×2): qty 2

## 2020-02-21 NOTE — BH Specialist Note (Deleted)
Integrated Behavioral Health via Telemedicine Visit  02/21/2020 Melissa Medina [id number]  Number of [place] visits: *** Session Start time: 10:15***  Session End time: 11:15*** Total time: {IBH Total Time:21014050}  Referring Provider: *** Patient/Family location: Home*** [facility] Provider location: [facility] [place] [facility]  All persons participating in visit: Patient *** and [place] ***  Types of Service: {CHL AMB TYPE OF SERVICE:[phone]}  I connected with Dayana Schermer and/or Amoree Satterly's {family members:20773} by {CHL AMB IBH TELEMEDICINE MODES:[phone]} and verified that I am speaking with the correct person using two identifiers.    Discussed confidentiality: {YES/NO:21197}  I discussed the limitations of telemedicine and the availability of in person appointments.  Discussed there is a possibility of technology failure and discussed alternative modes of communication if that failure occurs.  I discussed that engaging in this telemedicine visit, they consent to the provision of behavioral healthcare and the services will be billed under their insurance.  Patient and/or legal guardian expressed understanding and consented to Telemedicine visit: {YES/NO:21197}  Presenting Concerns: Patient and/or family reports the following symptoms/concerns: *** Duration of problem: ***; Severity of problem: {Mild/Moderate/Severe:20260}  Patient and/or Family's Strengths/Protective Factors: {CHL AMB BH PROTECTIVE FACTORS:[id number]}  Goals Addressed: Patient will: 1.  Reduce symptoms of: {IBH Symptoms:21014056}  2.  Increase knowledge and/or ability of: {IBH Patient Tools:21014057}  3.  Demonstrate ability to: {IBH Goals:21014053}  Progress towards Goals: {CHL AMB BH PROGRESS TOWARDS GOALS:[phone]}  Interventions: Interventions utilized:  {IBH Interventions:21014054} Standardized Assessments completed:  {IBH Screening Tools:21014051}  Patient and/or Family Response: ***  Assessment: Patient currently experiencing ***.   Patient may benefit from ***.  Plan: 1. Follow up with behavioral health clinician on : *** 2. Behavioral recommendations: *** 3. Referral(s): {IBH Referrals:21014055}  I discussed the assessment and treatment plan with the patient and/or parent/guardian. They were provided an opportunity to ask questions and all were answered. They agreed with the plan and demonstrated an understanding of the instructions.   They were advised to call back or seek an in-person evaluation if the symptoms worsen or if the condition fails to improve as anticipated.  Garlan Fair, LCSW   Depression screen [facility] 2/9 02/03/2020 01/20/2020 01/04/2020 12/03/2019 10/20/2019  Decreased Interest 3 3 3 2 1   Down, Depressed, Hopeless 3 3 2 2 1   PHQ - 2 Score 6 6 5 4 2   Altered sleeping 3 3 0 3 2  Tired, decreased energy 3 3 3 3 1   Change in appetite 3 3 3 3 1   Feeling bad or failure about yourself  2 2 2 2 2   Trouble concentrating 2 3 2 2 2   Moving slowly or fidgety/restless 2 2 1 1  0  Suicidal thoughts 2 0 0 0 0  PHQ-9 Score 23 22 16 18 10    GAD 7 : Generalized Anxiety Score 02/03/2020 01/20/2020 01/04/2020 12/03/2019  Nervous, Anxious, on Edge 3 3 2 3   Control/stop worrying 3 3 3 3   Worry too much - different things 3 3 3 2   Trouble relaxing 3 3 3 2   Restless 3 3 2 2   Easily annoyed or irritable 3 3 3 2   Afraid - awful might happen 3 3 3 2   Total GAD 7 Score 21 21 19 16     ***

## 2020-02-21 NOTE — Discharge Summary (Signed)
Postpartum Discharge Summary  Date of Service updated 02/23/2020    Patient Name: Melissa Medina DOB: [birth year] MRN: [id number]  Date of admission: 02/20/2020 Delivery date:02/21/2020  Delivering provider: Aletha Halim  Date of discharge: 02/23/2020  Admitting diagnosis: Gestational hypertension [O13.9] Intrauterine pregnancy: [redacted]w[redacted]d    Secondary diagnosis:  Principal Problem:   Forceps delivery Active Problems:   AMA (advanced maternal age) multigravida 35+   Group B streptococcal bacteriuria   Gestational diabetes   Severe preeclampsia, third trimester   Type 3a perineal laceration  Additional problems: as noted above    Discharge diagnosis: Forceps delivery                                           Post partum procedures:Magnesium infusion Augmentation: Pitocin, Cytotec and IP Foley Complications: 3a perineal laceration in s/o forceps delivery  Hospital course: Induction of Labor With Vaginal Delivery   49y.o. yo [name] at 377w1das admitted to the hospital 02/20/2020 for induction of [facility] with severe features.  Indication for induction: gHTN, found to have preeclampsia with severe features.  Patient had a labor course complicated by persistent Category 2 FHT with need for forceps assisted vaginal delivery as follows: Membrane Rupture Time/Date:  ,   Delivery Method:Vaginal, Forceps  Episiotomy: None  Lacerations:  3rd degree  Details of delivery can be found in separate delivery note. Patient had a routine postpartum course. Patient is discharged home 02/23/20.  Newborn Data: Birth date:02/21/2020  Birth time:1:50 PM  Gender:Female  Living status:Living  Apgars:6 ,9  Weight:2892 g   Magnesium Sulfate received: Yes: Seizure prophylaxis BMZ received: No Rhophylac:N/A MMR:N/A T-DaP:Given prenatally Flu: Yes Transfusion:No  Physical exam  Vitals:   02/22/20 2300 02/23/20 0514 02/23/20 0642 02/23/20 0705  BP:  130/81 (!) 152/83 (!) 145/80 (!) 142/74  Pulse: 90 82  88  Resp: '18 18  17  ' Temp: 98.1 F (36.7 C) 98.2 F (36.8 C)  98.1 F (36.7 C)  TempSrc: Oral Oral  Oral  SpO2: 100% 100%  100%  Weight:      Height:       General: alert, cooperative and no distress Lochia: appropriate Uterine Fundus: firm Incision: N/A DVT Evaluation: No evidence of DVT seen on physical exam. Labs: Lab Results  Component Value Date   WBC 11.4 (H) 02/21/2020   HGB 11.0 (L) 02/21/2020   HCT 34.0 (L) 02/21/2020   MCV 87.2 02/21/2020   PLT 212 02/21/2020   CMP Latest Ref Rng & Units 02/21/2020  Glucose 70 - 99 mg/dL 98  BUN 6 - 20 mg/dL 5(L)  Creatinine 0.44 - 1.00 mg/dL 0.72  Sodium 135 - 145 mmol/L 133(L)  Potassium 3.5 - 5.1 mmol/L 3.8  Chloride 98 - 111 mmol/L 101  CO2 22 - 32 mmol/L 21(L)  Calcium 8.9 - 10.3 mg/dL 7.7(L)  Total Protein 6.5 - 8.1 g/dL 5.5(L)  Total Bilirubin 0.3 - 1.2 mg/dL 0.9  Alkaline Phos 38 - 126 U/L 160(H)  AST 15 - 41 U/L 36  ALT 0 - 44 U/L 42   Edinburgh Score: No flowsheet data found.   After visit meds:  Allergies as of 02/23/2020   No Known Allergies     Medication List    STOP taking these medications   acetaminophen 325 MG tablet Commonly known as: TYLENOL  aspirin EC 81 MG tablet   ferrous sulfate 325 (65 FE) MG EC tablet   metFORMIN 500 MG tablet Commonly known as: GLUCOPHAGE   pantoprazole 40 MG tablet Commonly known as: Protonix   vitamin B-12 500 MCG tablet Commonly known as: CYANOCOBALAMIN     TAKE these medications   CALCIUM 1200 PO Take by mouth.   freestyle lancets Use to check blood sugars 4 times per day (fasting & 2 hours after each meal)   FREESTYLE TEST STRIPS test strip Generic drug: glucose blood 1 each by Other route in the morning, at noon, in the evening, and at bedtime.   glucose monitoring kit monitoring kit 1 each by Does not apply route as needed for other.   ibuprofen 600 MG tablet Commonly known as: ADVIL  Take 1 tablet (600 mg total) by mouth every 6 (six) hours.   NIFEdipine 30 MG 24 hr tablet Commonly known as: ADALAT CC Take 1 tablet (30 mg total) by mouth every morning.   prenatal multivitamin Tabs tablet Take 1 tablet by mouth daily at 12 noon.   vitamin C 250 MG tablet Commonly known as: ASCORBIC ACID Take 250 mg by mouth every other day.        Discharge home in stable condition Infant Feeding: Breast Infant Disposition:home with mother Discharge instruction: per After Visit Summary and Postpartum booklet. Activity: Advance as tolerated. Pelvic rest for 6 weeks.  Diet: routine diet Future Appointments: Future Appointments  Date Time Provider [place]  02/28/2020 10:15 AM Janet Berlin, MD [facility]  03/02/2020 10:15 AM [place] [facility]  03/21/2020  8:50 AM [facility]  03/21/2020 10:55 AM [place], Gildardo Cranker, MD [facility]   Follow up Visit:  [place] Follow up in 1 week(s).   Specialty: Obstetrics and Gynecology Why: BP check Contact information: [place] [address] [phone]             Message sent to [facility] on 02/21/20 for postpartum appt.  Please schedule this patient for a In person postpartum visit in 4 weeks with the following provider: Any provider. Additional Postpartum F/U:2 hour GTT, BP check 1 week and perineal laceration check 1 week  High risk pregnancy complicated by: AMA, gHTN >Severe Preeclampsia, A2GDM Delivery mode:  Vaginal, Forceps  Anticipated Birth Control:  Unsure   02/23/2020 Emeterio Reeve, MD

## 2020-02-21 NOTE — Progress Notes (Signed)
Vitals:   02/21/20 0430 02/21/20 0500 02/21/20 0530 02/21/20 0600  BP: 115/63 (!) 149/94 137/88 (!) 92/50  Pulse: 93 94 96 99  Resp: 18 18 18 20   Temp:      TempSrc:      SpO2:      Weight:      Height:       Patient doing well, no complaints, denies pain or pressure  IUPC and FSE continue to be in place.   Pitocin discontinued @ 0525 due to late and variable decelerations, amnioinfusion that was started around 0330 continues  FHR: 125/moderate/+accels/ occasional late and variable decelerations that resolved after discontinuing pitocin and position changes.   Dilation: (P) 4.5 Effacement (%): (P) 90 Cervical Position: Posterior Station: (P) -2 Presentation: Vertex Exam by:: (P) Wende Bushy CNM   Cervical dilation unchanged and slight change in effacement.  MVUs 180-220   Discussed plan of care with Dr Kennon Rounds. Recommends turning pitocin back up and restarting at 18milli-unit/min.  RN notified of pitocin restart dose change.   Lajean Manes, CNM 02/21/20, 6:32 AM

## 2020-02-21 NOTE — Discharge Instructions (Signed)
Postpartum Care After Vaginal Delivery This sheet gives you information about how to care for yourself from the time you deliver your baby to up to 6-12 weeks after delivery (postpartum period). Your health care provider may also give you more specific instructions. If you have problems or questions, contact your health care provider. Follow these instructions at home: Vaginal bleeding  It is normal to have vaginal bleeding (lochia) after delivery. Wear a sanitary pad for vaginal bleeding and discharge. ? During the first week after delivery, the amount and appearance of lochia is often similar to a menstrual period. ? Over the next few weeks, it will gradually decrease to a dry, yellow-brown discharge. ? For most women, lochia stops completely by 4-6 weeks after delivery. Vaginal bleeding can vary from woman to woman.  Change your sanitary pads frequently. Watch for any changes in your flow, such as: ? A sudden increase in volume. ? A change in color. ? Large blood clots.  If you pass a blood clot from your vagina, save it and call your health care provider to discuss. Do not flush blood clots down the toilet before talking with your health care provider.  Do not use tampons or douches until your health care provider says this is safe.  If you are not breastfeeding, your period should return 6-8 weeks after delivery. If you are feeding your child breast milk only (exclusive breastfeeding), your period may not return until you stop breastfeeding. Perineal care  Keep the area between the vagina and the anus (perineum) clean and dry as told by your health care provider. Use medicated pads and pain-relieving sprays and creams as directed.  If you had a cut in the perineum (episiotomy) or a tear in the vagina, check the area for signs of infection until you are healed. Check for: ? More redness, swelling, or pain. ? Fluid or blood coming from the cut or tear. ? Warmth. ? Pus or a bad  smell.  You may be given a squirt bottle to use instead of wiping to clean the perineum area after you go to the bathroom. As you start healing, you may use the squirt bottle before wiping yourself. Make sure to wipe gently.  To relieve pain caused by an episiotomy, a tear in the vagina, or swollen veins in the anus (hemorrhoids), try taking a warm sitz bath 2-3 times a day. A sitz bath is a warm water bath that is taken while you are sitting down. The water should only come up to your hips and should cover your buttocks. Breast care  Within the first few days after delivery, your breasts may feel heavy, full, and uncomfortable (breast engorgement). Milk may also leak from your breasts. Your health care provider can suggest ways to help relieve the discomfort. Breast engorgement should go away within a few days.  If you are breastfeeding: ? Wear a bra that supports your breasts and fits you well. ? Keep your nipples clean and dry. Apply creams and ointments as told by your health care provider. ? You may need to use breast pads to absorb milk that leaks from your breasts. ? You may have uterine contractions every time you breastfeed for up to several weeks after delivery. Uterine contractions help your uterus return to its normal size. ? If you have any problems with breastfeeding, work with your health care provider or Science writer.  If you are not breastfeeding: ? Avoid touching your breasts a lot. Doing this can make  your breasts produce more milk. ? Wear a good-fitting bra and use cold packs to help with swelling. ? Do not squeeze out (express) milk. This causes you to make more milk. Intimacy and sexuality  Ask your health care provider when you can engage in sexual activity. This may depend on: ? Your risk of infection. ? How fast you are healing. ? Your comfort and desire to engage in sexual activity.  You are able to get pregnant after delivery, even if you have not had  your period. If desired, talk with your health care provider about methods of birth control (contraception). Medicines  Take over-the-counter and prescription medicines only as told by your health care provider.  If you were prescribed an antibiotic medicine, take it as told by your health care provider. Do not stop taking the antibiotic even if you start to feel better. Activity  Gradually return to your normal activities as told by your health care provider. Ask your health care provider what activities are safe for you.  Rest as much as possible. Try to rest or take a nap while your baby is sleeping. Eating and drinking   Drink enough fluid to keep your urine pale yellow.  Eat high-fiber foods every day. These may help prevent or relieve constipation. High-fiber foods include: ? Whole grain cereals and breads. ? Brown rice. ? Beans. ? Fresh fruits and vegetables.  Do not try to lose weight quickly by cutting back on calories.  Take your prenatal vitamins until your postpartum checkup or until your health care provider tells you it is okay to stop. Lifestyle  Do not use any products that contain nicotine or tobacco, such as cigarettes and e-cigarettes. If you need help quitting, ask your health care provider.  Do not drink alcohol, especially if you are breastfeeding. General instructions  Keep all follow-up visits for you and your baby as told by your health care provider. Most women visit their health care provider for a postpartum checkup within the first 3-6 weeks after delivery. Contact a health care provider if:  You feel unable to cope with the changes that your child brings to your life, and these feelings do not go away.  You feel unusually sad or worried.  Your breasts become red, painful, or hard.  You have a fever.  You have trouble holding urine or keeping urine from leaking.  You have little or no interest in activities you used to enjoy.  You have not  breastfed at all and you have not had a menstrual period for 12 weeks after delivery.  You have stopped breastfeeding and you have not had a menstrual period for 12 weeks after you stopped breastfeeding.  You have questions about caring for yourself or your baby.  You pass a blood clot from your vagina. Get help right away if:  You have chest pain.  You have difficulty breathing.  You have sudden, severe leg pain.  You have severe pain or cramping in your lower abdomen.  You bleed from your vagina so much that you fill more than one sanitary pad in one hour. Bleeding should not be heavier than your heaviest period.  You develop a severe headache.  You faint.  You have blurred vision or spots in your vision.  You have bad-smelling vaginal discharge.  You have thoughts about hurting yourself or your baby. If you ever feel like you may hurt yourself or others, or have thoughts about taking your own life, get help  right away. You can go to the nearest emergency department or call:  Your local emergency services (911 in the [place]  A suicide crisis helpline, such as the [facility] at 408-358-7232. This is open 24 hours a day. Summary  The period of time right after you deliver your newborn up to 6-12 weeks after delivery is called the postpartum period.  Gradually return to your normal activities as told by your health care provider.  Keep all follow-up visits for you and your baby as told by your health care provider. This information is not intended to replace advice given to you by your health care provider. Make sure you discuss any questions you have with your health care provider. Document Revised: 03/14/2017 Document Reviewed: 12/23/2016 Elsevier Patient Education  2020 [place] of a Perineal Tear A perineal tear is a cut or tear (laceration) in the tissue between the opening of the vagina and the anus (perineum). Some women  develop a perineal tear during a vaginal birth. This can happen as the baby emerges from the birth canal and the perineum is stretched. There are four degrees of perineal tears based on how deep and long the laceration is:  First degree. This involves a shallow tear at the edge of the vaginal opening that extends slightly into the perineal skin.  Second degree. This involves tearing described in first degree perineal tear, and an additional deeper tear of the vaginal opening and perineal tissues. It may also include tearing of a muscle just under the perineal skin.  Third degree. This involves tearing described in first and second degree perineal tears, with the addition that tearing in the third degree extends into the muscle of the anus (anal sphincter).  Fourth degree. This involves all levels of tears described in first, second, and third degree perineal tears, with the tear in the fourth degree extending into the rectum. First and second degree perineal tears may or may not be stitched closed, depending on their location and appearance. Third and fourth degree perineal tears are stitched closed immediately after the baby's birth. What are the risks? Depending on the type of perineal tear you have, you may be at risk for:  Bleeding.  Developing a collection of blood in the perineal tear area (hematoma).  Pain. This may include pain when you urinate, or pain when you have a bowel movement.  Infection at the site of the tear.  Fever.  Trouble controlling your urination or bowels (incontinence).  Painful sex. How to care for a perineal tear Wound care  Take a sitz bath as told by your health care provider. A sitz bath is a warm water bath that is taken while you are sitting down. The water should only come up to your hips and should cover your buttocks. This can speed up healing. 1. Partially fill a bathtub with warm water. You will only need the water to be deep enough to cover your  hips and buttocks when you are sitting in it. 2. If your health care provider told you to put medicine in the water, follow the directions exactly as told. 3. Sit in the water and open the tub drain a little. 4. Turn on the warm water again to keep the tub at the correct level. Keep the water running constantly. 5. Soak in the water for 15-20 minutes or as told by your health care provider. 6. After the sitz bath, pat the affected area dry first.  Do not rub it. 7. Be careful when you stand up after the sitz bath because you may feel dizzy.  Wash your hands before and after applying medicine to the area.  Wear a sanitary pad as told by your health care provider. Change the pad as often as told by your health care provider.  Leave stitches (sutures), skin glue, or adhesive strips in place. These skin closures may need to stay in place for 2 weeks or longer. If adhesive strip edges start to loosen and curl up, you may trim the loose edges. Do not remove adhesive strips completely unless your health care provider tells you to do that.  Check your wound every day for signs of infection. Check for: ? Redness, swelling, or pain. ? Fluid or blood. ? Warmth. ? Pus or a bad smell. Managing pain  If directed, put ice on the painful area: ? Put ice in a plastic bag. ? Place a towel between your skin and the bag. ? Leave the ice on for 20 minutes, 2-3 times a day.  Apply a numbing spray to the perineal tear site as told by your health care provider. This may help with discomfort.  Take and apply over-the-counter and prescription medicines only as told by your health care provider.  If told, put about 3 witch hazel-containing hemorrhoid treatment pads on top of your sanitary pad. The witch hazel in the hemorrhoid pads helps with swelling and discomfort.  Sit on an inflatable ring or pillow. This may provide comfort. General instructions  Squeeze warm water on your perineum after urinating. This  should be done from front to back with a squeeze bottle. Pat the area to dry it.  Do not have sex, use tampons, or place anything in your vagina for at least 6 weeks or as told by your health care provider.  Keep all follow-up visits as told by your health care provider. These include any postpartum visits. This is important. Contact a health care provider if:  Your pain is not relieved with medicines.  You have painful urination.  You have redness, swelling, or pain around your tear.  You have fluid or blood coming from your tear.  Your tear feels warm to the touch.  You have pus or a bad smell coming from your tear.  You have a fever. Get help right away if:  Your tear opens.  You cannot urinate.  You have an increase in bleeding.  You have severe pain. Summary  A perineal tear is a cut or tear (laceration) in the tissue between the opening of the vagina and the anus (perineum).  There are four degrees of perineal tears based on how deep and long the laceration is.  First and second-degree perineal tears may or may not be stitched closed, depending on their location and appearance. Third and fourth- degree perineal tears are stitched closed immediately after the baby's birth.  Follow your health care provider's instructions for caring for your perineal tear. Know how to manage pain and how to care for your wound. Know when to call your health care provider and when to seek immediate emergency care. This information is not intended to replace advice given to you by your health care provider. Make sure you discuss any questions you have with your health care provider. Document Revised: 02/21/2017 Document Reviewed: 04/15/2016 Elsevier Patient Education  2020 [employer].

## 2020-02-21 NOTE — Progress Notes (Signed)
Labor Progress Note Melissa Medina is a 49 y.o. [name] at [redacted]w[redacted]d presented for gHTN but now on magnesium given preeclampsia with severe features based on blood pressures.  S: No concerns at this time.  O:  BP (!) 155/102   Pulse 98   Temp 98.4 F (36.9 C) (Oral)   Resp 15   Ht 5\' 5"  (1.651 m)   Wt 105.5 kg   LMP 06/05/2019   SpO2 97%   BMI 38.70 kg/m  EFM: baseline 125/minimal >moderate variability/no accels/recurrent variable decels with contractions  CVE: Dilation: 10 Effacement (%):  (Swelling noted on exam.  OP) Cervical Position: Middle Station: 0, Plus 1 Presentation: Vertex Exam by:: Melissa Medina   A&P: 49 y.o. [id number] [redacted]w[redacted]d presenting for IOL for gHTN but now on magnesium given preeclampsia with severe features based on blood pressures. #Labor: Progressing well. Noted to have complete cervical dilation at 1022. Given no initial urge to push, pt labored down for 1 hour. Pitocin restarted at 1100 to facilitate pushing. Will plan to initiate pushing now and monitor FHT closely. #Pain: epidural in place #FWB: Category 2 strip given recurrent variable decels with contractions. Reassuringly, variability now improved and good recovery s/p contractions. Will continue amnioinfusion and monitor strip closely in second stage. #GBS positive; on PCN #Preeclampsia with SF: repeat preeclampsia labs stable this AM. Blood pressures most recently in mild range. No concerning signs/symptoms. #A2GDM: BG 100 > 93. Will continue q2hr BG checks.  Randa Ngo, MD 11:55 AM

## 2020-02-21 NOTE — Lactation Note (Signed)
This note was copied from a baby's chart. Lactation Consultation Note  Patient Name: Melissa Medina [id number] Date: 02/21/2020  Mom is a G3.  Mom did not breastfeed with her first baby, but breastfed with her second baby for 4 years.  Mom had a breast reduction between this pregnancy and last one.  Mom reports she does not see any colostrum.  Assist with hand expression.  Small drops removed and showed dad how to put the drops in her mouth.  Urged them to do that to help with bloods sugars. Able to hand express small drops easily.  Dad reports she recently fed formula. Mom had planned to do both breastfeeding and formula feeding. Mom reports no noticeable breast changes during pregnancy.  Encouraged mom to initiate pumping with DEBP.  Mom does not have a breastpump for home use.  Mom reports she would like to wait until after she gets off meds.  Encouraged her to start sooner.  Reminded her that quicker she starts pumping more stimulation she will have.  Let RN know.  Urged to feed on cue and 8-12 or more times day.  Massage/hand express and post pump past breastfeedings and feed back all expressed mothers milk and formula as required or requested.  Reviewed and [facility] consultation Breastfeeding handout.  Urged to call lactation as needed.   Maternal Data    Feeding Feeding Type: Bottle Fed - Formula Nipple Type: Slow - flow  LATCH Score                   Interventions    Lactation Tools Discussed/Used     Consult Status      Melissa Medina 02/21/2020, 11:12 PM

## 2020-02-21 NOTE — Progress Notes (Signed)
POC to labor down for one hour as FHT's permit.  Pt does not feel pressure at this time.  Dr Ilda Basset answered questions for mother and FOB.

## 2020-02-21 NOTE — Progress Notes (Addendum)
L&D Note  02/21/2020 - 10:38 AM  49 y.o. [id number] [redacted]w[redacted]d. Pregnancy complicated by AMA, IVF pregnancy, gHTN and now with severe pre-eclampsia, GDMa2, GBS ps  Patient Active Problem List   Diagnosis Date Noted  . Elevated ALT measurement 02/21/2020  . Gestational hypertension 01/14/2020  . Gestational diabetes 01/05/2020  . Pregnancy resulting from in vitro fertilization 08/25/2019  . Group B streptococcal bacteriuria 08/25/2019  . Obesity 08/25/2019  . Acute nonintractable headache 08/17/2019  . Bilateral groin pain 08/17/2019  . Supervision of high risk pregnancy, antepartum 08/16/2019  . AMA (advanced maternal age) multigravida 35+ 08/16/2019  . Bilateral fibrocystic breast changes 03/23/2019  . Depression 03/23/2019  . Chronic low back pain 03/23/2019  . Gastroesophageal reflux disease 03/23/2019  . History of cervical dysplasia 03/23/2019  . Restless legs syndrome 03/23/2019  . Sleep apnea 03/23/2019  . Vitamin D deficiency 03/23/2019  . Posttraumatic stress disorder 03/23/2019  . Anxiety 11/21/2017  . Fibromyalgia 11/21/2017  . Pain syndrome, chronic 01/08/2017  . Uterine fibroid 03/25/2008    Ms. Melissa Medina is admitted for IOL for gHTN   Subjective:  Pitocin d/c and a dose of terb given due to variables with pit at 12.   No s/s of severe pre-eclampsia. Feeling some more pressure with UCs.   Objective:   Vitals:   02/21/20 0830 02/21/20 0900 02/21/20 0930 02/21/20 1000  BP: 111/63 128/77 139/77 (!) 154/99  Pulse: 100 95 93 97  Resp: 15 15 15 15   Temp: 98.5 F (36.9 C)     TempSrc: Oral     SpO2:      Weight:      Height:        Current Vital Signs 24h Vital Sign Ranges  T 98.5 F (36.9 C) Temp  Avg: 98.1 F (36.7 C)  Min: 97.8 F (36.6 C)  Max: 98.5 F (36.9 C)  BP (!) 154/99 BP  Min: 76/36  Max: 163/99  HR 97 Pulse  Avg: 86.8  Min: 62  Max: 100  RR 15 Resp  Avg: 17.6  Min: 15  Max: 20  SaO2 97 %   SpO2  Avg: 97.5 %  Min: 97 %  Max: 98 %        24 Hour I/O Current Shift I/O  Time Ins Outs 11/28 0701 - 11/29 0700 In: 4463.9 [P.O.:558; I.V.:3173.8] Out: 2135 [Urine:2135] 11/29 0701 - 11/29 1900 In: 685.7 [I.V.:685.7] Out: 150 [Urine:150]   UOP: 75-160mL/h  FHR: 125 baseline, +scalp stim, ?accel, one subtle late just now, mod variability Toco: q57m Gen: NAD SVE: complete, unable to tell position. Mild caput to +1 with BPD at zero. Pelvis feels adequate. EFW 3100gm  Labs:  Recent Labs  Lab 02/20/20 0730 02/20/20 1530 02/21/20 0839  WBC 7.4 8.4 11.4*  HGB 11.1* 10.6* 11.0*  HCT 34.1* 32.7* 34.0*  PLT 226 213 212   Recent Labs  Lab 02/16/20 1557 02/20/20 0730 02/21/20 0839  NA 136 137 133*  K 4.2 3.6 3.8  CL 104 106 101  CO2 19* 20* 21*  BUN 10 8 5*  CREATININE 0.67 0.57 0.72  CALCIUM 9.1 8.6* 7.7*  PROT 5.9* 6.0* 5.5*  BILITOT 0.5 0.7 0.9  ALKPHOS 206* 151* 160*  ALT 34* 34 42  AST 25 28 36  GLUCOSE 107* 81 98   Results for Melissa Medina, Melissa Medina (MRN [id number]) as of 02/21/2020 10:42  Ref. Range 02/20/2020 20:03 02/21/2020 00:05 02/21/2020 03:59 02/21/2020 08:01  Glucose-Capillary Latest Ref Range: 70 -  99 mg/dL 130 (H) 105 (H) 100 (H) 93    Medications Current Facility-Administered Medications  Medication Dose Route Frequency Provider Last Rate Last Admin  . acetaminophen (TYLENOL) tablet 1,000 mg  1,000 mg Oral Q6H PRN Wells Guiles R, CNM   1,000 mg at 02/21/20 0340  . diphenhydrAMINE (BENADRYL) injection 12.5 mg  12.5 mg Intravenous Q15 min PRN Donnamae Jude, MD      . ePHEDrine injection 10 mg  10 mg Intravenous PRN Donnamae Jude, MD      . ePHEDrine injection 10 mg  10 mg Intravenous PRN Effie Berkshire, MD   10 mg at 02/20/20 2159  . fentaNYL (SUBLIMAZE) injection 100 mcg  100 mcg Intravenous Q1H PRN Serita Grammes D, CNM   100 mcg at 02/20/20 1430  . fentaNYL 2 mcg/mL w/ bupivacaine 0.125% in NS 250 mL epidural infusion ([facility])  12 mL/hr Epidural Continuous PRN Effie Berkshire, MD      .  labetalol (NORMODYNE) injection 20 mg  20 mg Intravenous PRN Roma Schanz, CNM   20 mg at 02/20/20 1726   And  . labetalol (NORMODYNE) injection 40 mg  40 mg Intravenous PRN Roma Schanz, CNM       And  . labetalol (NORMODYNE) injection 80 mg  80 mg Intravenous PRN Roma Schanz, CNM       And  . hydrALAZINE (APRESOLINE) injection 10 mg  10 mg Intravenous PRN Roma Schanz, CNM      . lactated ringers amnioinfusion   Intrauterine Continuous Lajean Manes, CNM 150 mL/hr at 02/21/20 0345 Rate Change at 02/21/20 0345  . lactated ringers infusion 500-1,000 mL  500-1,000 mL Intravenous PRN Myrtis Ser, CNM 999 mL/hr at 02/21/20 0515 250 mL at 02/21/20 0515  . lactated ringers infusion   Intravenous Continuous Roma Schanz, [place] 70 mL/hr at 02/21/20 0758 New Bag/Given (Non-Interop) at 02/21/20 0758  . lidocaine (PF) (XYLOCAINE) 1 % injection 30 mL  30 mL Subcutaneous PRN Serita Grammes D, CNM      . magnesium sulfate 40 grams in SWI 1000 mL OB infusion  2 g/hr Intravenous Continuous Roma Schanz, CNM 50 mL/hr at 02/21/20 1000 2 g/hr at 02/21/20 1000  . misoprostol (CYTOTEC) tablet 25 mcg  25 mcg Vaginal Q4H PRN Myrtis Ser, CNM   25 mcg at 02/20/20 1315  . ondansetron (ZOFRAN) injection 4 mg  4 mg Intravenous Q6H PRN Serita Grammes D, CNM   4 mg at 02/20/20 2120  . oxyCODONE-acetaminophen (PERCOCET/ROXICET) 5-325 MG per tablet 1 tablet  1 tablet Oral Q4H PRN Myrtis Ser, CNM      . oxyCODONE-acetaminophen (PERCOCET/ROXICET) 5-325 MG per tablet 2 tablet  2 tablet Oral Q4H PRN Myrtis Ser, CNM      . oxytocin (PITOCIN) IV BOLUS FROM BAG  333 mL Intravenous Once Myrtis Ser, CNM      . oxytocin (PITOCIN) IV infusion 30 units in NS 500 mL - Premix  2.5 Units/hr Intravenous Continuous Myrtis Ser, CNM      . oxytocin (PITOCIN) IV infusion 30 units in NS 500 mL - Premix  1-40 milli-units/min Intravenous Titrated Autry-Lott, Simone, DO    Stopped at 02/21/20 5009  . penicillin G potassium 3 Million Units in dextrose 80mL IVPB  3 Million Units Intravenous Q4H Roma Schanz, CNM 100 mL/hr at 02/21/20 0758 3 Million Units at 02/21/20 0758  . PHENYLephrine 40 mcg/ml in normal saline  Adult IV Push Syringe (For Blood Pressure Support)  80 mcg Intravenous PRN Donnamae Jude, MD      . PHENYLephrine 40 mcg/ml in normal saline Adult IV Push Syringe (For Blood Pressure Support)  80 mcg Intravenous PRN Donnamae Jude, MD      . sodium citrate-citric acid (ORACIT) solution 30 mL  30 mL Oral Q2H PRN Myrtis Ser, CNM      . terbutaline (BRETHINE) injection 0.25 mg  0.25 mg Subcutaneous Once PRN Myrtis Ser, CNM       Facility-Administered Medications Ordered in Other Encounters  Medication Dose Route Frequency Provider Last Rate Last Admin  . fentaNYL citrate (PF) (SUBLIMAZE) 500 mcg, bupivacaine HCl (PF) (SENSORCAINE-MPF) 41.67 mL in sodium chloride (PF) 0.9 % 250 mL epidural   Epidural Continuous PRN Effie Berkshire, MD 12 mL/hr at 02/20/20 2031 12 mL/hr at 02/20/20 2031  . lidocaine (PF) (XYLOCAINE) 1 % injection   Epidural Anesthesia Intra-op Effie Berkshire, MD   5 mL at 02/20/20 2028    Assessment & Plan:  Pt doing well *IUP: category II but overall reassuring. Has FSE in place. Continue aminoinfusion.  *Labor: labor down for an hour or so and recheck and plan to start pushing at that time. D/w her may need operative delivery if has decels with pushing but baby would need to come down more. Restart pitocin prn after laboring down.  *Severe pre-eclampsia: continue Mg. BPs mild range. Normal labs this shift. UOP adequate *GDMa2 (metformin): normal CBGs *GBS pos: s/p adequate tx with pcn *Analgesia: epidural in place.   Durene Romans MD Attending Center for [facility])

## 2020-02-21 NOTE — Progress Notes (Signed)
CNM to bedside to reassess patient. Patient continues to be comfortable with epidural.   CNM at bedside from 2355 to 0035  Vitals:   02/20/20 2330 02/21/20 0000 02/21/20 0030 02/21/20 0100  BP: (!) 109/57 (!) 116/50 135/90 (!) 138/94  Pulse: 86 94 91 96  Resp:   18 18  Temp:   98 F (36.7 C)   TempSrc:   Oral   SpO2:      Weight:      Height:       Dilation: 4.5 Effacement (%): 60 Cervical Position: Posterior Station: -2, -3 Presentation: Vertex Exam by:: Darrol Poke CNM   Continue MAG  No cervical change, currently at 4 milliunit/min of pitocin.  Continue to titrate pitocin to active labor and recheck cervix in 2-3 hours  Hopeful SVD   Lajean Manes, CNM 02/21/20, 1:22 AM

## 2020-02-22 LAB — CULTURE, BETA STREP (GROUP B ONLY): Strep Gp B Culture: POSITIVE — AB

## 2020-02-22 LAB — GLUCOSE, CAPILLARY: Glucose-Capillary: 133 mg/dL — ABNORMAL HIGH (ref 70–99)

## 2020-02-22 NOTE — Progress Notes (Signed)
Post Partum Day 1 Subjective: no complaints, up ad lib, voiding, tolerating PO and denies HA, RUQ pain, vision changes, pt with some flushing but likely due to Magnesium   Objective: Blood pressure 118/72, pulse 87, temperature 97.9 F (36.6 C), temperature source Oral, resp. rate 18, height 5\' 5"  (1.651 m), weight 105.5 kg, last menstrual period 06/05/2019, SpO2 98 %, unknown if currently breastfeeding.  Physical Exam:  General: alert and cooperative Lochia: appropriate Uterine Fundus: firm DVT Evaluation: No evidence of DVT seen on physical exam.  Recent Labs    02/20/20 1530 02/21/20 0839  HGB 10.6* 11.0*  HCT 32.7* 34.0*    Assessment/Plan: Plan for discharge tomorrow, Breastfeeding and Contraception undecided  Continue Mag until 1729, asx, BP normotensive w/o meds   LOS: 2 days   Cherre Blanc 02/22/2020, 9:02 AM

## 2020-02-22 NOTE — Lactation Note (Signed)
This note was copied from a baby's chart. Lactation Consultation Note  Patient Name: Melissa Medina ZGYFV'C Date: 02/22/2020 Reason for consult: Follow-up assessment  Follow up visit with 60 hours old infant with 3.84% weight loss. Mother has been supplementing with 29 mL of formula via bottle. Discussed pacing while bottle-feeding formula, demonstrated technique. Talked about upright position and frequent burping.   RN proactively set up DEBP and LC demonstrated how to use it for stimulation and supplementation. Reviewed milk storage and proper care of parts. Reviewed the importance of breast stimulation for lactogenesis II.     Promoted maternal rest, hydration and food intake. Encouraged to contact [facility] for support when ready to breastfeed baby and recommended to request help for questions or concerns.   All questions answered at this time. Mother is pumping.   Maternal Data Formula Feeding for Exclusion: Yes Reason for exclusion: Mother's choice to formula and breast feed on admission  Feeding Feeding Type: Bottle Fed - Formula Nipple Type: Slow - flow  Interventions Interventions: DEBP;Hand pump;Expressed milk  Lactation Tools Discussed/Used Tools: Pump Breast pump type: Double-Electric Breast Pump Pump Review: Setup, frequency, and cleaning;Milk Storage Initiated by:: Mikle Sternberg IBCLC Date initiated:: 02/22/20   Consult Status Consult Status: Follow-up Date: 02/23/20 Follow-up type: In-patient    Noelle Hoogland A Higuera Ancidey 02/22/2020, 9:05 AM

## 2020-02-22 NOTE — Progress Notes (Signed)
CSW acknowledged consult and completed chart review. CSW will complete assessment with MOB once magnesium is discontinued.   Abundio Miu, [place] Worker [facility] Cell#: [phone]

## 2020-02-22 NOTE — Anesthesia Postprocedure Evaluation (Signed)
Anesthesia Post Note  Patient: Melissa Medina  Procedure(s) Performed: AN AD HOC LABOR EPIDURAL     Patient location during evaluation: Mother Baby Anesthesia Type: Epidural Level of consciousness: awake and alert Pain management: pain level controlled Vital Signs Assessment: post-procedure vital signs reviewed and stable Respiratory status: spontaneous breathing, nonlabored ventilation and respiratory function stable Cardiovascular status: stable Postop Assessment: no headache, no backache and epidural receding Anesthetic complications: no   No complications documented.  Last Vitals:  Vitals:   02/22/20 0620 02/22/20 0754  BP:  118/72  Pulse:  87  Resp: 18 18  Temp:  36.6 C  SpO2:  98%    Last Pain:  Vitals:   02/22/20 0754  TempSrc: Oral  PainSc:    Pain Goal: Patients Stated Pain Goal: 2 (02/21/20 1600)                 Eiliana Drone

## 2020-02-23 LAB — SURGICAL PATHOLOGY

## 2020-02-23 MED ORDER — NIFEDIPINE ER OSMOTIC RELEASE 30 MG PO TB24
30.0000 mg | ORAL_TABLET | Freq: Two times a day (BID) | ORAL | Status: DC
  Administered 2020-02-23: 30 mg via ORAL
  Filled 2020-02-23: qty 1

## 2020-02-23 MED ORDER — IBUPROFEN 600 MG PO TABS
600.0000 mg | ORAL_TABLET | Freq: Four times a day (QID) | ORAL | 0 refills | Status: DC
Start: 2020-02-23 — End: 2020-06-12

## 2020-02-23 MED ORDER — NIFEDIPINE ER 30 MG PO TB24
30.0000 mg | ORAL_TABLET | Freq: Every morning | ORAL | 1 refills | Status: DC
Start: 2020-02-23 — End: 2020-06-12

## 2020-02-23 NOTE — Progress Notes (Signed)
CSW received consult for hx of Anxiety and PTSD.  CSW met with MOB to offer support and complete assessment.    CSW met with MOB at bedside and introduced self, FOB present. MOB granted CSW verbal permission to speak in front of FOB about anything. CSW explained reason for consult. MOB was welcoming, pleasant, open and remained engaged during assessment. CSW and MOB discussed MOB's mental health history. MOB reported that she was diagnosed with anxiety and PTSD in 2007. 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 CSW thanked MOB for serving our country. MOB reported that she was on medication prior to pregnancy and discontinued medication during pregnancy. MOB was unable to recall the name of the medication and reported that it wasn't really helpful. MOB reported that she is unsure if she will restart medication as she plans to breast feed. MOB reported [facility] MOB reported that she hasn't really done therapy throughout her pregnancy. CSW inquired about how MOB was feeling without medication and therapy during pregnancy, MOB reported that she was feeling good. MOB denied any current symptoms of anxiety and PTSD. MOB reported that she did experience anxiety during her last trimester worrying about baby. CSW inquired about MOB's coping skills. MOB did not elaborate on coping skills but reported that she has learned to control her anxiety. CSW inquired about how MOB was feeling emotionally after giving birth, MOB reported that she was feeling normal and shared she cried a few times. CSW acknowledged and normalized MOB's emotions. CSW inquired about MOB's support system, MOB reported that her husband and daughters are supports. MOB reported that they have most items needed to care for infant including a car seat, crib and basinet. MOB reported that they just need to get smaller diapers and will be able to get those. MOB presented calm and did not demonstrate  any acute mental health signs/symptoms. CSW assessed for safety, MOB denied SI and HI. CSW did not assess for domestic violence as FOB was present.   CSW provided education regarding the baby blues period vs. perinatal mood disorders, discussed treatment and gave resources for mental health follow up if concerns arise.  CSW recommends self-evaluation during the postpartum time period using the New Mom Checklist from Postpartum Progress and encouraged MOB to contact a medical professional if symptoms are noted at any time.    CSW provided review of Sudden Infant Death Syndrome (SIDS) precautions.    CSW identifies no further need for intervention and no barriers to discharge at this time.  Gable Odonohue, LCSW Clinical Social Worker [facility] Cell#: [phone]    

## 2020-02-23 NOTE — Progress Notes (Signed)
Discharge instructions and prescriptions given to pt. Discussed post vaginal delivery care, signs and symptoms to report to the MD, upcoming appointments, and meds. Pt verbalizes understanding and has no questions or concerns at this time. Pt discharged home with baby in stable condition.

## 2020-02-23 NOTE — Lactation Note (Signed)
This note was copied from a baby's chart. Lactation Consultation Note  Patient Name: Melissa Medina [id number] Date: 02/23/2020 Reason for consult: Follow-up assessment;Early term 37-38.6wks;Breast reduction;Maternal endocrine disorder Type of Endocrine Disorder?: Diabetes   Mom and baby to d/c today. Mom continues to bf with cues followed by formula supplementation. She has +uterine cramps with bf and denies breast pain. We discussed the possibility of low milk supply related to multiple maternal risk factors. This LC recommends that mother and baby f/u with an [facility] p d/c to assess adequacy of supply or amount of supplementation needed to support infant's growth and development. This [place] sent a referral to [facility] outpatient [place].   Consult Status Consult Status: Complete Date: 02/23/20 Follow-up type: In-patient    Gwynne Edinger 02/23/2020, 9:32 AM

## 2020-02-24 ENCOUNTER — Inpatient Hospital Stay (HOSPITAL_COMMUNITY)
Admission: AD | Admit: 2020-02-24 | Discharge: 2020-02-24 | Disposition: A | Discharge status: Home / Self Care | Payer: No Typology Code available for payment source | Attending: Obstetrics & Gynecology | Admitting: Obstetrics & Gynecology

## 2020-02-24 ENCOUNTER — Other Ambulatory Visit: Payer: Self-pay

## 2020-02-24 ENCOUNTER — Inpatient Hospital Stay (HOSPITAL_COMMUNITY): Payer: No Typology Code available for payment source

## 2020-02-24 ENCOUNTER — Encounter (HOSPITAL_COMMUNITY): Payer: Self-pay | Admitting: Obstetrics & Gynecology

## 2020-02-24 DIAGNOSIS — R519 Headache, unspecified: Secondary | ICD-10-CM | POA: Diagnosis not present

## 2020-02-24 DIAGNOSIS — M549 Dorsalgia, unspecified: Secondary | ICD-10-CM | POA: Diagnosis not present

## 2020-02-24 DIAGNOSIS — R109 Unspecified abdominal pain: Secondary | ICD-10-CM | POA: Insufficient documentation

## 2020-02-24 DIAGNOSIS — O99893 Other specified diseases and conditions complicating puerperium: Secondary | ICD-10-CM

## 2020-02-24 DIAGNOSIS — O9089 Other complications of the puerperium, not elsewhere classified: Secondary | ICD-10-CM | POA: Insufficient documentation

## 2020-02-24 DIAGNOSIS — O165 Unspecified maternal hypertension, complicating the puerperium: Secondary | ICD-10-CM

## 2020-02-24 LAB — COMPREHENSIVE METABOLIC PANEL
ALT: 41 U/L (ref 0–44)
AST: 44 U/L — ABNORMAL HIGH (ref 15–41)
Albumin: 2.4 g/dL — ABNORMAL LOW (ref 3.5–5.0)
Alkaline Phosphatase: 142 U/L — ABNORMAL HIGH (ref 38–126)
Anion gap: 12 (ref 5–15)
BUN: 6 mg/dL (ref 6–20)
CO2: 22 mmol/L (ref 22–32)
Calcium: 8.8 mg/dL — ABNORMAL LOW (ref 8.9–10.3)
Chloride: 104 mmol/L (ref 98–111)
Creatinine, Ser: 0.57 mg/dL (ref 0.44–1.00)
GFR, Estimated: >60 mL/min (ref >60)
Glucose, Bld: 92 mg/dL (ref 70–99)
Potassium: 3.8 mmol/L (ref 3.5–5.1)
Sodium: 138 mmol/L (ref 135–145)
Total Bilirubin: 0.5 mg/dL (ref 0.3–1.2)
Total Protein: 5.5 g/dL — ABNORMAL LOW (ref 6.5–8.1)

## 2020-02-24 LAB — CBC
HCT: 27.1 % — ABNORMAL LOW (ref 36.0–46.0)
Hemoglobin: 8.8 g/dL — ABNORMAL LOW (ref 12.0–15.0)
MCH: 28.5 pg (ref 26.0–34.0)
MCHC: 32.5 g/dL (ref 30.0–36.0)
MCV: 87.7 fL (ref 80.0–100.0)
Platelets: 228 10*3/uL (ref 150–400)
RBC: 3.09 MIL/uL — ABNORMAL LOW (ref 3.87–5.11)
RDW: 15.8 % — ABNORMAL HIGH (ref 11.5–15.5)
WBC: 9.6 10*3/uL (ref 4.0–10.5)
nRBC: 0 % (ref 0.0–0.2)

## 2020-02-24 LAB — URINALYSIS, ROUTINE W REFLEX MICROSCOPIC
Bacteria, UA: NONE SEEN
Bilirubin Urine: NEGATIVE
Glucose, UA: NEGATIVE mg/dL
Ketones, ur: NEGATIVE mg/dL
Leukocytes,Ua: NEGATIVE
Nitrite: NEGATIVE
Protein, ur: NEGATIVE mg/dL
Specific Gravity, Urine: 1.006 (ref 1.005–1.030)
pH: 7 (ref 5.0–8.0)

## 2020-02-24 MED ORDER — CYCLOBENZAPRINE HCL 5 MG PO TABS
10.0000 mg | ORAL_TABLET | Freq: Once | ORAL | Status: AC
  Administered 2020-02-24: 10 mg via ORAL
  Filled 2020-02-24: qty 2

## 2020-02-24 MED ORDER — OXYCODONE HCL 5 MG PO TABS
5.0000 mg | ORAL_TABLET | Freq: Once | ORAL | Status: AC
  Administered 2020-02-24: 5 mg via ORAL
  Filled 2020-02-24: qty 1

## 2020-02-24 MED ORDER — CYCLOBENZAPRINE HCL 10 MG PO TABS: 10.0000 mg | ORAL_TABLET | Freq: Three times a day (TID) | ORAL | 2 refills | Status: DC | PRN

## 2020-02-24 MED ORDER — NIFEDIPINE ER OSMOTIC RELEASE 30 MG PO TB24
30.0000 mg | ORAL_TABLET | Freq: Once | ORAL | Status: AC
  Administered 2020-02-24: 30 mg via ORAL
  Filled 2020-02-24: qty 1

## 2020-02-24 NOTE — MAU Note (Signed)
Pt s/p vaginal delivery on 11/29, states she has had pain in her left lower back since discharge , also reports a headache since delivery. B/p at home 145/95

## 2020-02-24 NOTE — Discharge Instructions (Signed)
Postpartum Hypertension Postpartum hypertension is high blood pressure that remains higher than normal after childbirth. You may not realize that you have postpartum hypertension if your blood pressure is not being checked regularly. In most cases, postpartum hypertension will go away on its own, usually within a week of delivery. However, for some women, medical treatment is required to prevent serious complications, such as seizures or stroke. What are the causes? This condition may be caused by one or more of the following:  Hypertension that existed before pregnancy (chronic hypertension).  Hypertension that comes on as a result of pregnancy (gestational hypertension).  Hypertensive disorders during pregnancy (preeclampsia) or seizures in women who have high blood pressure during pregnancy (eclampsia).  A condition in which the liver, platelets, and red blood cells are damaged during pregnancy (HELLP syndrome).  A condition in which the thyroid produces too much hormones (hyperthyroidism).  Other rare problems of the nerves (neurological disorders) or blood disorders. In some cases, the cause may not be known. What increases the risk? The following factors may make you more likely to develop this condition:  Chronic hypertension. In some cases, this may not have been diagnosed before pregnancy.  Obesity.  Type 2 diabetes.  Kidney disease.  History of preeclampsia or eclampsia.  Other medical conditions that change the level of hormones in the body (hormonal imbalance). What are the signs or symptoms? As with all types of hypertension, postpartum hypertension may not have any symptoms. Depending on how high your blood pressure is, you may experience:  Headaches. These may be mild, moderate, or severe. They may also be steady, constant, or sudden in onset (thunderclap headache).  Changes in your ability to see (visual changes).  Dizziness.  Shortness of breath.  Swelling  of your hands, feet, lower legs, or face. In some cases, you may have swelling in more than one of these locations.  Heart palpitations or a racing heartbeat.  Difficulty breathing while lying down.  Decrease in the amount of urine that you pass. Other rare signs and symptoms may include:  Sweating more than usual. This lasts longer than a few days after delivery.  Chest pain.  Sudden dizziness when you get up from sitting or lying down.  Seizures.  Nausea or vomiting.  Abdominal pain. How is this diagnosed? This condition may be diagnosed based on the results of a physical exam, blood pressure measurements, and blood and urine tests. You may also have other tests, such as a CT scan or an MRI, to check for other problems of postpartum hypertension. How is this treated? If blood pressure is high enough to require treatment, your options may include:  Medicines to reduce blood pressure (antihypertensives). Tell your health care provider if you are breastfeeding or if you plan to breastfeed. There are many antihypertensive medicines that are safe to take while breastfeeding.  Stopping medicines that may be causing hypertension.  Treating medical conditions that are causing hypertension.  Treating the complications of hypertension, such as seizures, stroke, or kidney problems. Your health care provider will also continue to monitor your blood pressure closely until it is within a safe range for you. Follow these instructions at home:  Take over-the-counter and prescription medicines only as told by your health care provider.  Return to your normal activities as told by your health care provider. Ask your health care provider what activities are safe for you.  Do not use any products that contain nicotine or tobacco, such as cigarettes and e-cigarettes. If  you need help quitting, ask your health care provider.  Keep all follow-up visits as told by your health care provider. This  is important. Contact a health care provider if:  Your symptoms get worse.  You have new symptoms, such as: ? A headache that does not get better. ? Dizziness. ? Visual changes. Get help right away if:  You suddenly develop swelling in your hands, ankles, or face.  You have sudden, rapid weight gain.  You develop difficulty breathing, chest pain, racing heartbeat, or heart palpitations.  You develop severe pain in your abdomen.  You have any symptoms of a stroke. "BE FAST" is an easy way to remember the main warning signs of a stroke: ? B - Balance. Signs are dizziness, sudden trouble walking, or loss of balance. ? E - Eyes. Signs are trouble seeing or a sudden change in vision. ? F - Face. Signs are sudden weakness or numbness of the face, or the face or eyelid drooping on one side. ? A - Arms. Signs are weakness or numbness in an arm. This happens suddenly and usually on one side of the body. ? S - Speech. Signs are sudden trouble speaking, slurred speech, or trouble understanding what people say. ? T - Time. Time to call emergency services. Write down what time symptoms started.  You have other signs of a stroke, such as: ? A sudden, severe headache with no known cause. ? Nausea or vomiting. ? Seizure. These symptoms may represent a serious problem that is an emergency. Do not wait to see if the symptoms will go away. Get medical help right away. Call your local emergency services (911 in the [place] Do not drive yourself to the hospital. Summary  Postpartum hypertension is high blood pressure that remains higher than normal after childbirth.  In most cases, postpartum hypertension will go away on its own, usually within a week of delivery.  For some women, medical treatment is required to prevent serious complications, such as seizures or stroke. This information is not intended to replace advice given to you by your health care provider. Make sure you discuss any questions  you have with your health care provider. Document Revised: 04/17/2018 Document Reviewed: 12/30/2016 Elsevier Patient Education  [place]. Acute Back Pain, Adult Acute back pain is sudden and usually short-lived. It is often caused by an injury to the muscles and tissues in the back. The injury may result from:  A muscle or ligament getting overstretched or torn (strained). Ligaments are tissues that connect bones to each other. Lifting something improperly can cause a back strain.  Wear and tear (degeneration) of the spinal disks. Spinal disks are circular tissue that provides cushioning between the bones of the spine (vertebrae).  Twisting motions, such as while playing sports or doing yard work.  A hit to the back.  Arthritis. You may have a physical exam, lab tests, and imaging tests to find the cause of your pain. Acute back pain usually goes away with rest and home care. Follow these instructions at home: Managing pain, stiffness, and swelling  Take over-the-counter and prescription medicines only as told by your health care provider.  Your health care provider may recommend applying ice during the first 24-48 hours after your pain starts. To do this: ? Put ice in a plastic bag. ? Place a towel between your skin and the bag. ? Leave the ice on for 20 minutes, 2-3 times a day.  If directed, apply heat to  the affected area as often as told by your health care provider. Use the heat source that your health care provider recommends, such as a moist heat pack or a heating pad. ? Place a towel between your skin and the heat source. ? Leave the heat on for 20-30 minutes. ? Remove the heat if your skin turns bright red. This is especially important if you are unable to feel pain, heat, or cold. You have a greater risk of getting burned. Activity   Do not stay in bed. Staying in bed for more than 1-2 days can delay your recovery.  Sit up and stand up straight. Avoid leaning  forward when you sit, or hunching over when you stand. ? If you work at a desk, sit close to it so you do not need to lean over. Keep your chin tucked in. Keep your neck drawn back, and keep your elbows bent at a right angle. Your arms should look like the letter "L." ? Sit high and close to the steering wheel when you drive. Add lower back (lumbar) support to your car seat, if needed.  Take short walks on even surfaces as soon as you are able. Try to increase the length of time you walk each day.  Do not sit, drive, or stand in one place for more than 30 minutes at a time. Sitting or standing for long periods of time can put stress on your back.  Do not drive or use heavy machinery while taking prescription pain medicine.  Use proper lifting techniques. When you bend and lift, use positions that put less stress on your back: ? [place] your knees. ? Keep the load close to your body. ? Avoid twisting.  Exercise regularly as told by your health care provider. Exercising helps your back heal faster and helps prevent back injuries by keeping muscles strong and flexible.  Work with a physical therapist to make a safe exercise program, as recommended by your health care provider. Do any exercises as told by your physical therapist. Lifestyle  Maintain a healthy weight. Extra weight puts stress on your back and makes it difficult to have good posture.  Avoid activities or situations that make you feel anxious or stressed. Stress and anxiety increase muscle tension and can make back pain worse. Learn ways to manage anxiety and stress, such as through exercise. General instructions  Sleep on a firm mattress in a comfortable position. Try lying on your side with your knees slightly bent. If you lie on your back, put a pillow under your knees.  Follow your treatment plan as told by your health care provider. This may include: ? Cognitive or behavioral therapy. ? Acupuncture or massage  therapy. ? Meditation or yoga. Contact a health care provider if:  You have pain that is not relieved with rest or medicine.  You have increasing pain going down into your legs or buttocks.  Your pain does not improve after 2 weeks.  You have pain at night.  You lose weight without trying.  You have a fever or chills. Get help right away if:  You develop new bowel or bladder control problems.  You have unusual weakness or numbness in your arms or legs.  You develop nausea or vomiting.  You develop abdominal pain.  You feel faint. Summary  Acute back pain is sudden and usually short-lived.  Use proper lifting techniques. When you bend and lift, use positions that put less stress on your back.  Take  over-the-counter and prescription medicines and apply heat or ice as directed by your health care provider. This information is not intended to replace advice given to you by your health care provider. Make sure you discuss any questions you have with your health care provider. Document Revised: 06/30/2018 Document Reviewed: 10/23/2016 Elsevier Patient Education  [place].

## 2020-02-24 NOTE — MAU Provider Note (Signed)
Chief Complaint:  Back Pain and Headache   First Provider Initiated Contact with Patient 02/24/20 0140     HPI: Melissa Medina is a 49 y.o. G3P2003 who presents to maternity admissions reporting severe left flank pain, dribbling of urine (incontinence), and headache. Was treated for severe preeclampsia during intrapartum period.  Sent home on Procardia XL but pharmacy did not have it ready, so hasn't taken any yet. . She reports vaginal bleeding, no vaginal itching/burning, dizziness, n/v, or fever/chills.    Back Pain This is a new problem. The current episode started yesterday. The problem occurs constantly. The problem is unchanged. The pain is present in the lumbar spine (left flank). The quality of the pain is described as aching and cramping. The pain does not radiate. The pain is moderate. Stiffness is present all day. Associated symptoms include headaches. Pertinent negatives include no abdominal pain, dysuria, fever, paresis or paresthesias.  Headache  This is a recurrent problem. The current episode started yesterday. The problem occurs constantly. The pain does not radiate. The pain quality is similar to prior headaches. The quality of the pain is described as aching. Associated symptoms include back pain. Pertinent negatives include no abdominal pain, dizziness, fever, nausea or photophobia. Nothing aggravates the symptoms. She has tried acetaminophen for the symptoms. The treatment provided no relief.   RN Note: Pt s/p vaginal delivery on 11/29, states she has had pain in her left lower back since discharge , also reports a headache since delivery. B/p at home 145/95  Past Medical History: Past Medical History:  Diagnosis Date  . Abnormal uterine bleeding 03/23/2019  . Anemia   . Anxiety   . Bilateral plantar fasciitis   . Chronic headaches   . Fibroid   . Fibromyalgia   . GERD (gastroesophageal reflux disease)   . Gestational diabetes   . Herniated disc, cervical   . History  of reduction mammoplasty 03/23/2019  . Iron deficiency anemia due to chronic blood loss 03/23/2019  . Menometrorrhagia 03/23/2019  . Migraine   . Patellofemoral syndrome of left knee   . Patellofemoral syndrome of right knee   . Pregnancy induced hypertension   . PTSD (post-traumatic stress disorder)   . Pyelonephritis 03/23/2019  . Sleep apnea     Past obstetric history: OB History  Gravida Para Term Preterm AB Living  '3 3 2     3  ' SAB TAB Ectopic Multiple Live Births          2    # Outcome Date GA Lbr Len/2nd Weight Sex Delivery Anes PTL Lv  3 Term 1993   3402 g  Vag-Spont   LIV  2 Term 1990   3402 g  Vag-Spont   LIV  1 Para             Past Surgical History: Past Surgical History:  Procedure Laterality Date  . APPENDECTOMY     childhood  . BREAST REDUCTION SURGERY  2009  . CHOLECYSTECTOMY    . DILATION AND CURETTAGE OF UTERUS    . LIPOSUCTION MULTIPLE BODY PARTS    . TONSILLECTOMY     childhood    Family History: Family History  Problem Relation Age of Onset  . Hypertension Mother   . Diabetes Mother   . Kidney failure Mother   . Healthy Father   . Aneurysm Father        brain    Social History: Social History   Tobacco Use  . Smoking status: Never  Smoker  . Smokeless tobacco: Never Used  Vaping Use  . Vaping Use: Never used  Substance Use Topics  . Alcohol use: No  . Drug use: No    Allergies: No Known Allergies  Meds:  Medications Prior to Admission  Medication Sig Dispense Refill Last Dose  . acetaminophen (TYLENOL) 500 MG tablet Take 1,000 mg by mouth every 6 (six) hours as needed.   02/24/2020 at 0100  . Calcium Carbonate-Vit D-Min (CALCIUM 1200 PO) Take by mouth.   02/23/2020 at Unknown time  . Prenatal Vit-Fe Fumarate-FA (PRENATAL MULTIVITAMIN) TABS tablet Take 1 tablet by mouth daily at 12 noon.   02/23/2020 at Unknown time  . vitamin C (ASCORBIC ACID) 250 MG tablet Take 250 mg by mouth every other day.    02/23/2020 at Unknown time  .  glucose blood (FREESTYLE TEST STRIPS) test strip 1 each by Other route in the morning, at noon, in the evening, and at bedtime. 100 strip 2   . glucose monitoring kit (FREESTYLE) monitoring kit 1 each by Does not apply route as needed for other. 1 each 0   . ibuprofen (ADVIL) 600 MG tablet Take 1 tablet (600 mg total) by mouth every 6 (six) hours. 30 tablet 0   . Lancets (FREESTYLE) lancets Use to check blood sugars 4 times per day (fasting & 2 hours after each meal) 120 each 1   . NIFEdipine (ADALAT CC) 30 MG 24 hr tablet Take 1 tablet (30 mg total) by mouth every morning. 30 tablet 1     I have reviewed patient's Past Medical Hx, Surgical Hx, Family Hx, Social Hx, medications and allergies.  ROS:  Review of Systems  Constitutional: Negative for fever.  Eyes: Negative for photophobia.  Gastrointestinal: Negative for abdominal pain and nausea.  Genitourinary: Negative for dysuria.  Musculoskeletal: Positive for back pain.  Neurological: Positive for headaches. Negative for dizziness and paresthesias.   Other systems negative     Physical Exam   Patient Vitals for the past 24 hrs:  BP Temp Temp src Pulse Resp SpO2  02/24/20 0201 (!) 156/88 -- -- (!) 102 -- 98 %  02/24/20 0156 -- -- -- -- -- 98 %  02/24/20 0151 -- -- -- -- -- 98 %  02/24/20 0146 (!) 159/94 -- -- (!) 106 -- 99 %  02/24/20 0141 -- -- -- -- -- 98 %  02/24/20 0136 -- -- -- -- -- 99 %  02/24/20 0131 -- -- -- -- -- 98 %  02/24/20 0130 (!) 155/93 -- -- (!) 114 -- --  02/24/20 0129 (!) 155/93 -- -- -- -- --  02/24/20 0124 -- 98.8 F (37.1 C) Oral (!) 118 19 100 %   Constitutional: Well-developed, well-nourished female in no acute distress.  Cardiovascular: normal rate and rhythm, no ectopy audible, S1 & S2 heard, no murmur Respiratory: normal effort, no distress. Lungs CTAB with no wheezes or crackles GI: Abd soft, non-tender.  Nondistended.  No rebound, No guarding.  Bowel Sounds audible  MS: Extremities nontender,  1+ edema, normal ROM Neurologic: Alert and oriented x 4.   Grossly nonfocal.  DTRs 3+ no clonus GU: Neg CVAT on right, mild cvat on left Skin:  Warm and Dry Psych:  Affect appropriate.  PELVIC EXAM: deferred  Labs: --/--/O POS (11/28 0730) Results for orders placed or performed during the hospital encounter of 02/24/20 (from the past 24 hour(s))  Urinalysis, Routine w reflex microscopic Urine, Catheterized     Status: Abnormal  Collection Time: 02/24/20  1:50 AM  Result Value Ref Range   Color, Urine STRAW (A) YELLOW   APPearance CLEAR CLEAR   Specific Gravity, Urine 1.006 1.005 - 1.030   pH 7.0 5.0 - 8.0   Glucose, UA NEGATIVE NEGATIVE mg/dL   Hgb urine dipstick MODERATE (A) NEGATIVE   Bilirubin Urine NEGATIVE NEGATIVE   Ketones, ur NEGATIVE NEGATIVE mg/dL   Protein, ur NEGATIVE NEGATIVE mg/dL   Nitrite NEGATIVE NEGATIVE   Leukocytes,Ua NEGATIVE NEGATIVE   RBC / HPF 11-20 0 - 5 RBC/hpf   WBC, UA 0-5 0 - 5 WBC/hpf   Bacteria, UA NONE SEEN NONE SEEN  CBC     Status: Abnormal   Collection Time: 02/24/20  2:03 AM  Result Value Ref Range   WBC 9.6 4.0 - 10.5 K/uL   RBC 3.09 (L) 3.87 - 5.11 MIL/uL   Hemoglobin 8.8 (L) 12.0 - 15.0 g/dL   HCT 27.1 (L) 36 - 46 %   MCV 87.7 80.0 - 100.0 fL   MCH 28.5 26.0 - 34.0 pg   MCHC 32.5 30.0 - 36.0 g/dL   RDW 15.8 (H) 11.5 - 15.5 %   Platelets 228 150 - 400 K/uL   nRBC 0.0 0.0 - 0.2 %  Comprehensive metabolic panel     Status: Abnormal   Collection Time: 02/24/20  2:03 AM  Result Value Ref Range   Sodium 138 135 - 145 mmol/L   Potassium 3.8 3.5 - 5.1 mmol/L   Chloride 104 98 - 111 mmol/L   CO2 22 22 - 32 mmol/L   Glucose, Bld 92 70 - 99 mg/dL   BUN 6 6 - 20 mg/dL   Creatinine, Ser 0.57 0.44 - 1.00 mg/dL   Calcium 8.8 (L) 8.9 - 10.3 mg/dL   Total Protein 5.5 (L) 6.5 - 8.1 g/dL   Albumin 2.4 (L) 3.5 - 5.0 g/dL   AST 44 (H) 15 - 41 U/L   ALT 41 0 - 44 U/L   Alkaline Phosphatase 142 (H) 38 - 126 U/L   Total Bilirubin 0.5 0.3 - 1.2  mg/dL   GFR, Estimated >60 >60 mL/min   Anion gap 12 5 - 15    Imaging:  US RENAL  Result Date: 02/24/2020 CLINICAL DATA:  Left-sided flank pain, 3 days postpartum EXAM: RENAL / URINARY TRACT ULTRASOUND COMPLETE COMPARISON:  None. FINDINGS: Right Kidney: Renal measurements: 12.0 x 4.5 x 5.3 cm. = volume: 150 mL. Echogenicity within normal limits. No mass or hydronephrosis visualized. Left Kidney: Renal measurements: 11.2 x 5.9 x 5.2 cm = volume: 180 mL. Echogenicity within normal limits. No mass or hydronephrosis visualized. Bladder: Appears normal for degree of bladder distention. Other: None. IMPRESSION: No acute abnormality noted. Electronically Signed   By: Inez Catalina M.D.   On: 02/24/2020 03:11      MAU Course/MDM: I have ordered labs as follows:  CBC, CMET, UA (cath) >> no sign of UTI or stone Imaging ordered: Renal US which was negative. DDx:  Pyelonephritis, renal stone, muscle spasm  Results reviewed.   Consult Dr Elonda Husky.  Recommends giving Procardia XL now, analgesic.   Treatments in MAU included  Flexeril, Procardia XL, and Percocet Finally got some relief from headache.  Assessment: Postpartum Day #3 Recent severe preeclampsia, delivered Left flank pain Headache  Plan: Discharge home per Dr Elonda Husky Recommend followup in office with BP check Rx sent for Flexeril  for headache prn at home  Pt stable at time of discharge  Encouraged to return here or to other Urgent Care/ED if she develops worsening of symptoms, increase in pain, fever, or other concerning symptoms.   Hansel Feinstein CNM, MSN Certified Nurse-Midwife 02/24/2020 2:25 AM

## 2020-02-24 NOTE — MAU Note (Signed)
In and out cath for approx 30 cc clear urine

## 2020-02-28 ENCOUNTER — Ambulatory Visit (INDEPENDENT_AMBULATORY_CARE_PROVIDER_SITE_OTHER): Payer: No Typology Code available for payment source | Admitting: Obstetrics and Gynecology

## 2020-02-28 ENCOUNTER — Encounter: Payer: Self-pay | Admitting: Obstetrics and Gynecology

## 2020-02-28 ENCOUNTER — Other Ambulatory Visit: Payer: Self-pay

## 2020-02-28 DIAGNOSIS — O165 Unspecified maternal hypertension, complicating the puerperium: Secondary | ICD-10-CM

## 2020-02-28 MED ORDER — SENNA 8.6 MG PO TABS: 1.0000 | ORAL_TABLET | Freq: Every day | ORAL | 0 refills | Status: DC

## 2020-02-28 MED ORDER — DOCUSATE SODIUM 100 MG PO CAPS: 100.0000 mg | ORAL_CAPSULE | Freq: Two times a day (BID) | ORAL | 2 refills | Status: DC | PRN

## 2020-02-28 MED ORDER — POLYETHYLENE GLYCOL 3350 17 GM/SCOOP PO POWD: 1.0000 | Freq: Once | ORAL | 0 refills | Status: AC

## 2020-02-28 NOTE — Patient Instructions (Signed)
Take miralax one scoop one to two times daily. Titrate miralax with a goal of one soft bowel movement daily. You can also take colace daily to help with keeping stool soft. If miralax does not work, then use senna.   Please continue taking your Procardia daily.

## 2020-02-28 NOTE — Progress Notes (Signed)
   History:  Ms. Melissa Medina is a 49 y.o. G3P2003 who presents to clinic today for postpartum BP check and laceration check.    Delivered 11/29. Forceps delivery w 3a laceration. PEC w SF, received magnesium therapy. Was not discharged home on meds as she remained normotensive.   Seen in MAU on 12/2, started on procardia because she was noted to have MR pressures. Supposed to take it everyday, last dose was yesterday morning   Reports some bleeding, oozing from wound. Feels like she is leaking. Reports constipation, not taking any bowel regimen.   No fevers or chills. No foul smelling drainage. Baby is doing well.    The following portions of the patient's history were reviewed and updated as appropriate: allergies, current medications, family history, past medical history, social history, past surgical history and problem list.  Review of Systems:  Review of Systems  Constitutional: Negative for chills and fever.  Eyes: Negative for blurred vision.  Respiratory: Negative for cough.   Cardiovascular: Negative for chest pain.  Gastrointestinal: Positive for constipation. Negative for abdominal pain, blood in stool, nausea and vomiting.  Genitourinary: Negative for dysuria, frequency and urgency.  Neurological: Negative for dizziness and headaches.      Objective:  Physical Exam BP (!) 139/98   Pulse (!) 110   Wt 210 lb 1.6 oz (95.3 kg)   BMI 34.96 kg/m  Physical Exam Vitals and nursing note reviewed.  Constitutional:      Appearance: Normal appearance.  Cardiovascular:     Rate and Rhythm: Normal rate and regular rhythm.  Pulmonary:     Effort: Pulmonary effort is normal.  Genitourinary:    General: Normal vulva.     Comments: Laceration appears well approximated overall along rectum and perineum. There is a 1x1cm defect in left vaginal suture site with area of clot. Area is indurated but not red, no drainage, only mildly tender to palpation.  Skin:    General: Skin  is warm and dry.  Neurological:     Mental Status: She is alert.  Psychiatric:        Mood and Affect: Mood normal.        Behavior: Behavior normal.       Labs and Imaging No results found for this or any previous visit (from the past 24 hour(s)).  No results found.   Assessment & Plan:   Postpartum HTN -continue procardia. Blood pressure slightly elevated but did not take her BP meds today. Discussed importance of taking medication. Discussed signs and symptoms of PEC with SF and she verbalizes understanding.  3A Perineal Laceration -healing well overall. Does have small defect but do not feel that it is infected. Had Dr. Ilda Basset assess area as well and in agreeance. Encouraged use of sitz baths, etc. Discussed timeline for healing.  -also discussed bowel regimen, plan to start taking miralax and colace.  Plan for f/u in one week to recheck BP and laceration.   Melissa Berlin, MD 02/28/2020 11:53 AM    Melissa Skeans, MD [facility] Family Medicine Fellow, [facility] for [facility], [place]

## 2020-03-02 ENCOUNTER — Ambulatory Visit

## 2020-03-02 ENCOUNTER — Encounter

## 2020-03-02 ENCOUNTER — Telehealth: Payer: Self-pay | Admitting: Lactation Services

## 2020-03-02 ENCOUNTER — Other Ambulatory Visit: Payer: Self-pay

## 2020-03-02 NOTE — BH Specialist Note (Signed)
Integrated Behavioral Health via Telemedicine Visit  03/02/2020 Melissa Medina [id number]  Number of [place] visits: 2 (first visit under 15 minutes) Session Start time: 9:48  Session End time: 10:12 Total time: 24  Referring Provider: Unknown Jim, MD Patient/Family location: Home [name] Provider location: Center for [place] at [facility]  All persons participating in visit: Patient Melissa Medina and Melissa Medina   Types of Service: Individual psychotherapy  I connected with Melissa Medina and/or Melissa Medina's n/a by Telephone and verified that I am speaking with the correct person using two identifiers.    Discussed confidentiality: Yes  I discussed the limitations of telemedicine and the availability of in person appointments.  Discussed there is a possibility of technology failure and discussed alternative modes of communication if that failure occurs.  I discussed that engaging in this telemedicine visit, they consent to the provision of behavioral healthcare and the services will be billed under their insurance.  Patient and/or legal guardian expressed understanding and consented to Telemedicine visit: Yes   Presenting Concerns: Patient and/or family reports the following symptoms/concerns: Pt states her primary concern today is feeling lonely postpartum after husband was called back to work, and no family local; is interested in finding out childcare options for returning to work after maternity leave of 12 weeks; would like to discuss with medical provider the safety of [name] medications while breastfeeding before returning tto the [place] for [facility] medication management Duration of problem: Ongoing; Severity of problem: moderately severe  Patient and/or Family's Strengths/Protective Factors: Concrete supports in place (healthy food, safe environments, etc.) and Sense of purpose  Goals Addressed: Patient will: 1.  Reduce  symptoms of: anxiety, depression and stress  2.  Increase knowledge and/or ability of: healthy habits  3.  Demonstrate ability to: Increase healthy adjustment to current life circumstances and Increase adequate support systems for patient/family  Progress towards Goals: Revised  Interventions: Interventions utilized:  Solution-Focused Strategies, Psychoeducation and/or Health Education and Link to [employer] Assessments completed: PHQ9/GAD7 given in past two weeks  Patient and/or Family Response: Pt agrees to revised treatment plan  Assessment: Patient currently experiencing PTSD.   Patient may benefit from psychoeducation and brief therapeutic interventions regarding coping with symptoms of depression and anxiety, related to PTSD .  Plan: 1. Follow up with behavioral health clinician on : Three weeks, 03/28/2020 virtual visit 2. Behavioral recommendations:  -Register for online postpartum support group at [employer].postpartum.net (Look for [employer] mom support) -Consider www.WomenInsider.com.ee for additional childcare options -Continue taking prenatal vitamin until postpartum visit -[facility] medication management 3. Referral(s): Integrated Orthoptist (In Clinic) and [employer] Resources:  Childcare; new mom support  I discussed the assessment and treatment plan with the patient and/or parent/guardian. They were provided an opportunity to ask questions and all were answered. They agreed with the plan and demonstrated an understanding of the instructions.   They were advised to call back or seek an in-person evaluation if the symptoms worsen or if the condition fails to improve as anticipated.  Melissa Fair, LCSW   Depression screen [facility] 2/9 02/28/2020 02/03/2020 01/20/2020 01/04/2020 12/03/2019  Decreased Interest 2 3 3 3 2   Down, Depressed, Hopeless 2 3 3 2 2   PHQ - 2 Score 4 6 6 5 4   Altered sleeping 2 3 3  0 3   Tired, decreased energy 2 3 3 3 3   Change in appetite 2 3 3  3  3  Feeling bad or failure about yourself  2 2 2 2 2   Trouble concentrating 2 2 3 2 2   Moving slowly or fidgety/restless 2 2 2 1 1   Suicidal thoughts 0 2 0 0 0  PHQ-9 Score 16 23 22 16 18    GAD 7 : Generalized Anxiety Score 02/28/2020 02/03/2020 01/20/2020 01/04/2020  Nervous, Anxious, on Edge 2 3 3 2   Control/stop worrying 2 3 3 3   Worry too much - different things 2 3 3 3   Trouble relaxing 2 3 3 3   Restless 2 3 3 2   Easily annoyed or irritable 2 3 3 3   Afraid - awful might happen 2 3 3 3   Total GAD 7 Score 14 21 21  19

## 2020-03-02 NOTE — Telephone Encounter (Signed)
Received a call from Melissa Medina with [facility]. She reports patient post delivery on 11/29. She reports that PPD Screen was 17 and answered never to # 10.   Patient had appt scheduled with Vesta Mixer, LCSW today and had to change to 12/18 due to infants pediatrician appt.   She reports husband left for a long term assignment. She reports she has no other family in the area. She reports her friends work full time. Her older children live out of state. She is able to call them on the phone.   [name] is asking for to see if [name] is able to reach out to patient sooner. She is also requesting a call back for follow up.   Called and spoke with patient. She is concerned she does not have any support in the area. She is feeling lonely. She feels very emotional and feels empty. She does not have thoughts of harming herself or the infant.   She is breast feeding and formula feeding infant. Reviewed breast feeding support groups and how to sign up on PrintStats.tn. reviewed that may be one way to meet other mothers with children. She has a church but has not been attending, reviewed reaching out to her pastor is she would like.   Reviewed with mother that there is a [facility] Care on [place] that is open 24 hours a day or a local ER if she needed assistance right away. She reports she has PTSD from her time in the [place].   Reviewed with patient to reach out to the office via phone or My Chart if she has questions or concerns. Patient voiced understanding.

## 2020-03-06 ENCOUNTER — Other Ambulatory Visit: Payer: Self-pay

## 2020-03-06 ENCOUNTER — Ambulatory Visit (INDEPENDENT_AMBULATORY_CARE_PROVIDER_SITE_OTHER)

## 2020-03-06 VITALS — BP 128/90 | HR 108 | Wt 205.0 lb

## 2020-03-06 DIAGNOSIS — Z0130 Encounter for examination of blood pressure without abnormal findings: Secondary | ICD-10-CM

## 2020-03-06 NOTE — Progress Notes (Signed)
Pt here for BP check following severe preeclampsia in third trimester; delivered 02/21/20. Reports taking Nifedipine 30 mg daily. BP today is 128/90. Endorses occasional headaches relieved by ibuprofen; denies other s/s of hypertension. [place], MD to bedside to assess vaginal laceration and review BP; per MD laceration appears well healing, pt to continue BP med and follow up at [facility] visit.  Nifedipine 30 mg.  Reports some concern regarding breastfeeding and baby's weight gain. Encouraged pt to schedule with lactation consultant. [place] office to schedule.   Apolonio Schneiders RN 03/07/20

## 2020-03-07 ENCOUNTER — Ambulatory Visit (INDEPENDENT_AMBULATORY_CARE_PROVIDER_SITE_OTHER): Payer: No Typology Code available for payment source | Admitting: Clinical

## 2020-03-07 DIAGNOSIS — F4310 Post-traumatic stress disorder, unspecified: Secondary | ICD-10-CM | POA: Diagnosis not present

## 2020-03-07 NOTE — Patient Instructions (Addendum)
Center for [facility] at [facility] [place], [place] [address] [phone] (main office) [phone] ([place] office)  Www.postpartum.net (Support groups, including [employer] Mom Postpartum Support Group)   [employer]  (Childcare options, Early childcare development, etc.) www.guilfordchilddev.org

## 2020-03-07 NOTE — Progress Notes (Signed)
I agree with the care plan as documented in the RN note.    Gloristine Turrubiates, MD OB Family Medicine Fellow, [facility], [facility]  

## 2020-03-14 NOTE — BH Specialist Note (Signed)
Integrated Behavioral Health via Telemedicine Visit  03/14/2020 Melissa Medina [id number]  Pt did not arrive to video visit and did not answer the phone ; Left HIPPA-compliant message to call back [name] from Center for [employer] at [facility] at [phone] (main office) or [phone] ([place] office).  ; left MyChart message for patient.    Caroleen Hamman Roby Donaway, LCSW

## 2020-03-21 ENCOUNTER — Other Ambulatory Visit

## 2020-03-21 ENCOUNTER — Ambulatory Visit: Admitting: Obstetrics and Gynecology

## 2020-03-27 ENCOUNTER — Other Ambulatory Visit

## 2020-03-28 ENCOUNTER — Ambulatory Visit: Admitting: Clinical

## 2020-03-28 DIAGNOSIS — Z91199 Patient's noncompliance with other medical treatment and regimen due to unspecified reason: Secondary | ICD-10-CM

## 2020-03-28 DIAGNOSIS — Z5329 Procedure and treatment not carried out because of patient's decision for other reasons: Secondary | ICD-10-CM

## 2020-03-30 ENCOUNTER — Ambulatory Visit: Admitting: Obstetrics & Gynecology

## 2020-03-30 ENCOUNTER — Other Ambulatory Visit

## 2020-04-10 ENCOUNTER — Other Ambulatory Visit: Admitting: Obstetrics and Gynecology

## 2020-04-10 ENCOUNTER — Encounter: Payer: Self-pay | Admitting: *Deleted

## 2020-04-10 NOTE — Progress Notes (Signed)
Tried to reach patient via phone.  Left voicemail message and sent My Chart message.  Patient has appointments tomorrow morning.  Office not opening until 10 am.  Lab appointment in scheduled for 8:20 am.  Told patient to come in for 9:55 am doctor appointment at 10 am tomorrow and we would reschedule her lab appointment when she arrives for her doctor appointment.

## 2020-04-11 ENCOUNTER — Other Ambulatory Visit: Payer: Self-pay

## 2020-04-11 ENCOUNTER — Telehealth (INDEPENDENT_AMBULATORY_CARE_PROVIDER_SITE_OTHER): Payer: No Typology Code available for payment source | Admitting: Advanced Practice Midwife

## 2020-04-11 ENCOUNTER — Other Ambulatory Visit

## 2020-04-11 DIAGNOSIS — O99893 Other specified diseases and conditions complicating puerperium: Secondary | ICD-10-CM

## 2020-04-11 DIAGNOSIS — F53 Postpartum depression: Secondary | ICD-10-CM | POA: Diagnosis not present

## 2020-04-11 DIAGNOSIS — O24415 Gestational diabetes mellitus in pregnancy, controlled by oral hypoglycemic drugs: Secondary | ICD-10-CM

## 2020-04-11 DIAGNOSIS — R32 Unspecified urinary incontinence: Secondary | ICD-10-CM

## 2020-04-11 NOTE — Patient Instructions (Signed)
Pelvic Floor Dysfunction  Pelvic floor dysfunction (PFD) is a condition that results when the group of muscles and connective tissues that support the organs in the pelvis (pelvic floor muscles) do not work well. These muscles and their connections form a sling that supports the colon and bladder. In men, these muscles also support the prostate gland. In women, they also support the uterus. PFD causes pelvic floor muscles to be too weak, too tight, or a combination of both. In PFD, muscle movements are not coordinated. This condition may cause bowel or bladder problems. It may also cause pain. What are the causes? This condition may be caused by an injury to the pelvic area or by a weakening of pelvic muscles. This often results from pregnancy and childbirth or other types of strain. In many cases, the exact cause is not known. What increases the risk? The following factors may make you more likely to develop this condition:  Having a condition of chronic bladder tissue inflammation (interstitial cystitis).  Being an older person.  Being overweight.  Radiation treatment for cancer in the pelvic region.  Previous pelvic surgery, such as removal of the uterus (hysterectomy) or prostate gland (prostatectomy). What are the signs or symptoms? Symptoms of this condition vary and may include:  Bladder symptoms, such as: ? Trouble starting urination and emptying the bladder. ? Frequent urinary tract infections. ? Leaking urine when coughing, laughing, or exercising (stress incontinence). ? Having to pass urine urgently or frequently. ? Pain when passing urine.  Bowel symptoms, such as: ? Constipation. ? Urgent or frequent bowel movements. ? Incomplete bowel movements. ? Painful bowel movements. ? Leaking stool or gas.  Unexplained genital or rectal pain.  Genital or rectal muscle spasms.  Low back pain. In women, symptoms of PFD may also include:  A heavy, full, or aching feeling in  the vagina.  A bulge that protrudes into the vagina.  Pain during or after sexual intercourse. How is this diagnosed? This condition may be diagnosed based on:  Your symptoms and medical history.  A physical exam. During the exam, your health care provider may check your pelvic muscles for tightness, spasm, pain, or weakness. This may include a rectal exam and a pelvic exam for women. In some cases, you may have diagnostic tests, such as:  Electrical muscle function tests.  Urine flow testing.  X-ray tests of bowel function.  Ultrasound of the pelvic organs. How is this treated? Treatment for this condition depends on your symptoms. Treatment options include:  Physical therapy. This may include Kegel exercises to help relax or strengthen the pelvic floor muscles.  Biofeedback. This type of therapy provides feedback on how tight your pelvic floor muscles are so that you can learn to control them.  Internal or external massage therapy.  A treatment that involves electrical stimulation of the pelvic floor muscles to help control pain (transcutaneous electrical nerve stimulation, or TENS).  Sound wave therapy (ultrasound) to reduce muscle spasms.  Medicines, such as: ? Muscle relaxants. ? Bladder control medicines. Surgery to reconstruct or support pelvic floor muscles may be an option if other treatments do not help. Follow these instructions at home: Activity  Do your usual activities as told by your health care provider. Ask your health care provider if you should modify any activities.  Do pelvic floor strengthening or relaxing exercises at home as told by your physical therapist. Lifestyle  Maintain a healthy weight.  Eat foods that are high in fiber, such as  beans, whole grains, and fresh fruits and vegetables.  Limit foods that are high in fat and processed sugars, such as fried or sweet foods.  Manage stress with relaxation techniques such as yoga or  meditation. General instructions  If you have problems with leakage: ? Use absorbable pads or wear padded underwear. ? Wash frequently with mild soap. ? Keep your genital and anal area as clean and dry as possible. ? Ask your health care provider if you should try a barrier cream to prevent skin irritation.  Take warm baths to relieve pelvic muscle tension or spasms.  Take over-the-counter and prescription medicines only as told by your health care provider.  Keep all follow-up visits as told by your health care provider. This is important. Contact a health care provider if you:  Are not improving with home care.  Have signs or symptoms of PFD that get worse at home.  Develop new signs or symptoms at home.  Have signs of a urinary tract infection, such as: ? Fever. ? Chills. ? Urinary frequency. ? A burning feeling when urinating.  Have not had a bowel movement in 3 days (constipation). Summary  Pelvic floor dysfunction results when the muscles and connective tissues in your pelvic floor do not work well.  These muscles and their connections form a sling that supports your colon and bladder. In men, these muscles also support the prostate gland. In women, they also support the uterus.  PFD may be caused by an injury to the pelvic area or by a weakening of pelvic muscles.  PFD causes pelvic floor muscles to be too weak, too tight, or a combination of both. Symptoms may vary from person to person.  In most cases, PFD can be treated with physical therapies and medicines. Surgery may be an option if other treatments do not help. This information is not intended to replace advice given to you by your health care provider. Make sure you discuss any questions you have with your health care provider. Document Revised: 09/29/2017 Document Reviewed: 09/29/2017 Elsevier Patient Education  [place].

## 2020-04-11 NOTE — Progress Notes (Signed)
I connected with Melissa Medina on 04/11/20 at 10:00 AM EST by: Mychart video and verified that I am speaking with the correct person using two identifiers.  Patient is located at home and provider is located at [employer] for [employer] at [employer] [facility] .     The purpose of this virtual visit is to provide medical care while limiting exposure to the novel coronavirus. I discussed the limitations, risks, security and privacy concerns of performing an evaluation and management service by [facility] and the availability of in person appointments. I also discussed with the patient that there may be a patient responsible charge related to this service. By engaging in this virtual visit, you consent to the provision of healthcare.  Additionally, you authorize for your insurance to be billed for the services provided during this visit.  The patient expressed understanding and agreed to proceed.   The following staff members participated in the virtual visit: Louisa Second, RN  Post Partum Visit Note Subjective:   Melissa Medina is a 50 y.o. G61P2003 female being evaluated for postpartum followup.  She is 7 weeks 1 day postpartum following a forceps delivery at [redacted]w[redacted]d.  I have fully reviewed the prenatal and intrapartum course; pregnancy complicated by the following: has Supervision of high risk pregnancy, antepartum; AMA (advanced maternal age) multigravida 78+; Acute nonintractable headache; Bilateral groin pain; Pregnancy resulting from in vitro fertilization; Group B streptococcal bacteriuria; Obesity; Anxiety; Bilateral fibrocystic breast changes; Depression; Pain syndrome, chronic; Fibromyalgia; Chronic low back pain; Gastroesophageal reflux disease; History of cervical dysplasia; Restless legs syndrome; Sleep apnea; Uterine fibroid; Vitamin D deficiency; Posttraumatic stress disorder; Gestational diabetes; Severe preeclampsia, third trimester; Elevated ALT measurement; Forceps delivery;  and Type 3a perineal laceration on their problem list..  Postpartum course has been complicated by urinary incontinence and patient concerns regarding healing of her pelvic laceration. Baby is doing well. Baby is feeding by both breast and bottle - [name] Start. Bleeding no bleeding. Bowel function is normal. Bladder function is abnormal: pt reports incontinence of urine. Patient is not sexually active. Contraception method is none. Postpartum depression screening: positive. Previously referred to [facility] [place], [facility].   The pregnancy intention screening data noted above was reviewed.   The following portions of the patient's history were reviewed and updated as appropriate: allergies, current medications, past family history, past medical history, past social history, past surgical history and problem list.  Review of Systems A comprehensive review of systems was negative.   Objective:  There were no vitals filed for this visit. Self-Obtained       Assessment:    Complete postpartum exam. Planned via MyChart per patient request due to recent inclement weather but patient unable to achieve MyChart access. Visit transitioned to telehealth.  Plan:  Essential components of care per ACOG recommendations:  1.  Mood and well being: Patient with positive depression screening today. Reviewed local resources for support.  - Patient does not use tobacco.  - hx of drug use? No    2. Infant care and feeding:  -Patient currently breastmilk feeding? Yes If breastmilk feeding discussed return to work and pumping. If needed, patient was provided letter for work to allow for every 2-3 hr pumping breaks, and to be granted a private location to express breastmilk and refrigerated area to store breastmilk. Reviewed importance of draining breast regularly to support lactation. - Patient requests additional meetings with Dennison Mascot, will schedule followup  3. Sleep and fatigue -Encouraged  family/partner/community support of 4 hrs of uninterrupted sleep to help with mood and fatigue  4. Physical Recovery  - Discussed patients delivery and complications - Patient had a 3a degree laceration, perineal healing reviewed. Patient expressed understanding - Patient has urinary incontinence? Yes Patient was referred to pelvic floor PT  - Patient is not safe to resume physical and sexual activity  6.  Health Maintenance - PCP with [facility] - Patient discontinued Procardia 30 XL (previously prescribed) - Discussed handoff of HTN management to PCP  7. Chronic Disease - PCP follow up  30 minutes of non-face-to-face time spent with the patient    Mallie Snooks, MSN, CNM Certified Nurse Midwife, [facility] for [employer], [place] 04/11/20 2:57 PM

## 2020-04-13 ENCOUNTER — Other Ambulatory Visit

## 2020-04-26 ENCOUNTER — Other Ambulatory Visit: Payer: Self-pay | Admitting: *Deleted

## 2020-04-26 DIAGNOSIS — Z8632 Personal history of gestational diabetes: Secondary | ICD-10-CM

## 2020-05-02 ENCOUNTER — Other Ambulatory Visit

## 2020-05-09 ENCOUNTER — Encounter: Payer: Self-pay | Admitting: Certified Nurse Midwife

## 2020-05-09 ENCOUNTER — Other Ambulatory Visit: Payer: Self-pay

## 2020-05-09 ENCOUNTER — Other Ambulatory Visit

## 2020-05-09 ENCOUNTER — Other Ambulatory Visit (HOSPITAL_COMMUNITY)
Admission: RE | Admit: 2020-05-09 | Discharge: 2020-05-09 | Disposition: A | Discharge status: Home / Self Care | Payer: No Typology Code available for payment source | Source: Ambulatory Visit | Attending: Certified Nurse Midwife | Admitting: Certified Nurse Midwife

## 2020-05-09 ENCOUNTER — Ambulatory Visit (INDEPENDENT_AMBULATORY_CARE_PROVIDER_SITE_OTHER): Payer: No Typology Code available for payment source | Admitting: Certified Nurse Midwife

## 2020-05-09 VITALS — BP 143/98 | HR 79 | Wt 207.6 lb

## 2020-05-09 DIAGNOSIS — K5900 Constipation, unspecified: Secondary | ICD-10-CM | POA: Diagnosis not present

## 2020-05-09 DIAGNOSIS — N819 Female genital prolapse, unspecified: Secondary | ICD-10-CM | POA: Diagnosis not present

## 2020-05-09 DIAGNOSIS — R102 Pelvic and perineal pain: Secondary | ICD-10-CM | POA: Diagnosis not present

## 2020-05-09 DIAGNOSIS — R32 Unspecified urinary incontinence: Secondary | ICD-10-CM

## 2020-05-09 DIAGNOSIS — Z8632 Personal history of gestational diabetes: Secondary | ICD-10-CM

## 2020-05-09 MED ORDER — DOCUSATE SODIUM 100 MG PO CAPS: 100.0000 mg | ORAL_CAPSULE | Freq: Two times a day (BID) | ORAL | 2 refills | Status: DC | PRN

## 2020-05-09 NOTE — Patient Instructions (Signed)
Pelvic Organ Prolapse Pelvic organ prolapse is a condition in women that involves the stretching, bulging, or dropping of pelvic organs into an abnormal position, past the opening of the vagina. It happens when the muscles and tissues that surround and support pelvic structures become weak or stretched. Pelvic organ prolapse can involve the:  Vagina (vaginal prolapse).  Uterus (uterine prolapse).  Bladder (cystocele).  Rectum (rectocele).  Intestines (enterocele). When organs other than the vagina are involved, they often bulge into the vagina or protrude from the vagina, depending on how severe the prolapse is. What are the causes? This condition may be caused by:  Pregnancy, labor, and childbirth.  Past pelvic surgery.  Lower levels of the hormone estrogen due to menopause.  Consistently lifting more than 50 lb (23 kg).  Obesity.  Long-term difficulty passing stool (chronic constipation).  Long-term, or chronic, cough.  Fluid buildup in the abdomen due to certain conditions. What are the signs or symptoms? Symptoms of this condition include:  Leaking a little urine (loss of bladder control) when you cough, sneeze, strain, and exercise (stress incontinence). This may be worse immediately after childbirth. It may gradually improve over time.  Feeling pressure in your pelvis or vagina. This pressure may increase when you cough or when you are passing stool.  A bulge that protrudes from the opening of your vagina.  Difficulty passing urine or stool.  Pain in your lower back.  Pain or discomfort during sex, or decreased interest in sex.  Repeated bladder infections (urinary tract infections).  Difficulty inserting a tampon. In some people, this condition causes no symptoms. How is this diagnosed? This condition may be diagnosed based on a vaginal and rectal exam. During the exam, you may be asked to cough and strain while you are lying down, sitting, and standing up.  Your health care provider will determine if other tests are required, such as bladder function tests. How is this treated? Treatment for this condition may depend on your symptoms. Treatment may include:  Lifestyle changes, such as drinking plenty of fluids and eating foods that are high in fiber.  Emptying your bladder at scheduled times (bladder training therapy). This can help reduce or avoid urinary incontinence.  Estrogen. This may help mild prolapse by increasing the strength and tone of pelvic floor muscles.  Kegel exercises. These may help mild cases of prolapse by strengthening and tightening the muscles of the pelvic floor.  A soft, flexible device that helps support the vaginal walls and keep pelvic organs in place (pessary). This is inserted into your vagina by your health care provider.  Surgery. This is often the only form of treatment for severe prolapse. Follow these instructions at home: Eating and drinking  Avoid drinking beverages that contain caffeine or alcohol.  Increase your intake of high-fiber foods to decrease constipation and straining during bowel movements. Activity  Lose weight if recommended by your health care provider.  Avoid heavy lifting and straining with exercise and work. Do not hold your breath when you perform mild to moderate lifting and exercise activities. Limit your activities as directed by your health care provider.  Do Kegel exercises as directed by your health care provider. To do this: ? Squeeze your pelvic floor muscles tight. You should feel a tight lift in your rectal area and a tightness in your vaginal area. Keep your stomach, buttocks, and legs relaxed. ? Hold the muscles tight for up to 10 seconds. Then relax your muscles. ? Repeat this exercise  50 times a day, or as much as told by your health care provider. Continue to do this exercise for at least 4-6 weeks, or for as long as told by your health care provider. General  instructions  Take over-the-counter and prescription medicines only as told by your health care provider.  Wear a sanitary pad or adult diapers if you have urinary incontinence.  If you have a pessary, take care of it as told by your health care provider.  Keep all follow-up visits. This is important. Contact a health care provider if you:  Have symptoms that interfere with your daily activities or sex life.  Need medicine to help with the discomfort.  Notice bleeding from your vagina that is not related to your menstrual period.  Have a fever.  Have pain or bleeding when you urinate.  Have bleeding when you pass stool.  Pass urine when you have sex.  Have chronic constipation.  Have a pessary that falls out.  Have a foul-smelling vaginal discharge.  Have an unusual, low pain in your abdomen. Get help right away if you:  Cannot pass urine. Summary  Pelvic organ prolapse is the stretching, bulging, or dropping of pelvic organs into an abnormal position. It happens when the muscles and tissues that surround and support pelvic structures become weak or stretched.  When organs other than the vagina are involved, they often bulge into the vagina or protrude from it, depending on how severe the prolapse is.  In most cases, this condition needs to be treated only if it produces symptoms. Treatment may include lifestyle changes, estrogen, Kegel exercises, pessary insertion, or surgery.  Avoid heavy lifting and straining with exercise and work. Do not hold your breath when you perform mild to moderate lifting and exercise activities. Limit your activities as directed by your health care provider. This information is not intended to replace advice given to you by your health care provider. Make sure you discuss any questions you have with your health care provider. Document Revised: 09/06/2019 Document Reviewed: 09/06/2019 Elsevier Patient Education  [place].

## 2020-05-09 NOTE — Progress Notes (Signed)
History:  Ms. Melissa Medina is a 50 y.o. G3P2003 who presents to clinic today for follow up from delivery. Patient delivered vaginally in 02/21/2020 with forceps and had 3A laceration. Patient reports that since delivery she has been having urinary incontinence, pelvic pain and constipation.   Patient reports that she had some spotting three weeks ago but denies currently having any bleeding. She is currently breastfeeding with complaints or concerns. Patient has appointment with PCP next month and [facility]   The following portions of the patient's history were reviewed and updated as appropriate: allergies, current medications, family history, past medical history, social history, past surgical history and problem list.  Review of Systems:  Review of Systems  Constitutional: Negative.   Respiratory: Negative.   Cardiovascular: Negative.   Gastrointestinal: Positive for constipation. Negative for diarrhea, nausea and vomiting.       Pelvic pain   Genitourinary: Negative for dysuria, frequency and urgency.       Urinary incontinence   Musculoskeletal: Negative.   Neurological: Negative.   Psychiatric/Behavioral: Negative.      Objective:  Physical Exam BP (!) 143/98   Pulse 79   Wt 207 lb 9.6 oz (94.2 kg)   LMP 04/18/2020   BMI 34.55 kg/m  Physical Exam Vitals reviewed.  Constitutional:      Appearance: Normal appearance.  HENT:     Head: Normocephalic.  Cardiovascular:     Rate and Rhythm: Normal rate and regular rhythm.  Pulmonary:     Effort: Pulmonary effort is normal. No respiratory distress.     Breath sounds: Normal breath sounds. No wheezing.  Abdominal:     General: There is no distension.     Palpations: Abdomen is soft. There is no mass.     Tenderness: There is no right CVA tenderness, left CVA tenderness or guarding.  Genitourinary:    Comments: Bimanual exam: Cervix 0/long/high, firm, anterior, neg CMT, uterus nontender,  nonenlarged, adnexa without tenderness, enlargement, or mass, Cystocele noted with examination.  Musculoskeletal:     Right lower leg: No edema.     Left lower leg: No edema.  Skin:    General: Skin is warm and dry.  Neurological:     Mental Status: She is alert and oriented to person, place, and time.  Psychiatric:        Mood and Affect: Mood normal.        Behavior: Behavior normal.        Thought Content: Thought content normal.    Assessment & Plan:  1. Urinary incontinence concurrent with and due to female genital prolapse - Cystocele diagnosed on examination today  - Urine Culture - Ambulatory referral to Physical Therapy  2. Type 3a perineal laceration during delivery - Healed. No tenderness or redness  - Increased white thin vaginal discharge with obtain swab to test for yeast and BV - Cervicovaginal ancillary only( [place])  3. Pelvic pain in female - Patient reports pelvic pain since delivery, slight abdominal diastasis noted on examination  - educated and discussed pelvic PT to help with diastasis and increase abdominal/pelvic strength to reduce pelvic pain, patient verbalizes understanding and agrees with plan of care  - Ambulatory referral to Physical Therapy  4. Constipation, acute - Patient taking Miralax daily for constipation,educated and discussed use of stool softener daily instead of miralax  - docusate sodium (COLACE) 100 MG capsule; Take 1 capsule (100 mg total) by mouth 2 (two) times daily as needed.  Dispense: 30 capsule; Refill: 2   Herby Abraham 05/09/2020 9:34 AM

## 2020-05-10 LAB — GLUCOSE TOLERANCE, 2 HOURS
Glucose, 2 hour: 138 mg/dL (ref 65–139)
Glucose, GTT - Fasting: 94 mg/dL (ref 65–99)

## 2020-05-10 LAB — CERVICOVAGINAL ANCILLARY ONLY
Bacterial Vaginitis (gardnerella): NEGATIVE
Candida Glabrata: NEGATIVE
Candida Vaginitis: NEGATIVE
Comment: NEGATIVE
Comment: NEGATIVE
Comment: NEGATIVE

## 2020-05-11 LAB — URINE CULTURE

## 2020-06-06 ENCOUNTER — Ambulatory Visit: Admitting: Family Medicine

## 2020-06-12 ENCOUNTER — Other Ambulatory Visit: Payer: Self-pay

## 2020-06-12 ENCOUNTER — Encounter: Payer: Self-pay | Admitting: Family Medicine

## 2020-06-12 ENCOUNTER — Ambulatory Visit (INDEPENDENT_AMBULATORY_CARE_PROVIDER_SITE_OTHER): Payer: No Typology Code available for payment source | Admitting: Family Medicine

## 2020-06-12 DIAGNOSIS — Z8632 Personal history of gestational diabetes: Secondary | ICD-10-CM | POA: Diagnosis not present

## 2020-06-12 DIAGNOSIS — M797 Fibromyalgia: Secondary | ICD-10-CM

## 2020-06-12 DIAGNOSIS — N393 Stress incontinence (female) (male): Secondary | ICD-10-CM

## 2020-06-12 DIAGNOSIS — R32 Unspecified urinary incontinence: Secondary | ICD-10-CM | POA: Insufficient documentation

## 2020-06-12 NOTE — Progress Notes (Signed)
   Subjective:    Patient ID: Melissa Medina is a 50 y.o. female presenting with Follow-up and urinary leaking  on 06/12/2020.  HPI: Melissa Medina is a 50 y.o. female presenting for follow-up s/p urinary incontinence. She had a forceps-assisted vaginal delivery on 02/21/2020 with a type 3 laceration. Since then, she has had urinary incontinence, which has required her to use pads or diapers daily. She reports loss of urine with valvsalva and movement. It does feel some better. She tried Kegel exercises, which did not help. She is uninterested in further child-bearing. In addition, she reports constant 10/10 generalized and pelvic pain. The pain is poorly controlled with ibuprofen and tylenol. She has a h/o fibromyalgia and depression. She is not on medications for this now. Reports pain is worse since giving birth and is generalized. She does have some pelvic pain that radiates to her back.   Review of Systems  Constitutional: Negative for chills and fever.  Respiratory: Negative for shortness of breath.   Cardiovascular: Negative for chest pain.  Gastrointestinal: Negative for abdominal pain, nausea and vomiting.  Genitourinary: Positive for pelvic pain. Negative for dysuria.  Skin: Negative for rash.      Objective:    BP (!) 130/91   Pulse 89   Ht 5\' 5"  (1.651 m)   Wt 211 lb (95.7 kg)   LMP 06/05/2020 (Within Days)   Breastfeeding Yes   BMI 35.11 kg/m  Physical Exam Vitals reviewed.  Constitutional:      General: She is not in acute distress.    Appearance: She is well-developed.  HENT:     Head: Normocephalic and atraumatic.  Eyes:     General: No scleral icterus. Cardiovascular:     Rate and Rhythm: Normal rate.  Pulmonary:     Effort: Pulmonary effort is normal.  Abdominal:     Palpations: Abdomen is soft.  Musculoskeletal:     Cervical back: Neck supple.  Skin:    General: Skin is warm and dry.  Neurological:     Mental Status: She is alert and oriented to  person, place, and time.  Psychiatric:        Mood and Affect: Mood normal.        Assessment & Plan:   Problem List Items Addressed This Visit      Unprioritized   Fibromyalgia    Take ibuprofen as needed.      History of gestational diabetes    Normal 2 hour pp. Will need annual screening.      Forceps delivery - Primary    Likely contributing to her pain and incontinence      Relevant Orders   Ambulatory referral to Urogynecology   Type 3a perineal laceration    Likely contributing to her incontinence.      Urinary incontinence     Referral to urogynecology.--needs formal urodynamics and possible surgical repair. Now 4 months since giving birth. Offered pelvic PT and she has [employer] so difficulty in getting this arranged.      Relevant Orders   Ambulatory referral to Urogynecology       Return if symptoms worsen or fail to improve, for referral.  Rennis Chris 06/12/2020 9:40 AM Patient seen in conjunction with the medical student.  I have taken the history and preformed the exam.  The above note has been edited as necessary.  Donnamae Jude, MD  06/12/2020 10:16 AM

## 2020-06-12 NOTE — Assessment & Plan Note (Signed)
Likely contributing to her pain and incontinence

## 2020-06-12 NOTE — Assessment & Plan Note (Signed)
Normal 2 hour pp. Will need annual screening.

## 2020-06-12 NOTE — Assessment & Plan Note (Signed)
Referral to urogynecology.--needs formal urodynamics and possible surgical repair. Now 4 months since giving birth. Offered pelvic PT and she has [employer] so difficulty in getting this arranged.

## 2020-06-12 NOTE — Patient Instructions (Signed)

## 2020-06-12 NOTE — Progress Notes (Signed)
Patient had elevated phq9 & gad7 today- reports seeing psychiatry 2 weeks ago and has a follow up appt with them next month. Declines seeing [name] today

## 2020-06-12 NOTE — Assessment & Plan Note (Signed)
Likely contributing to her incontinence.

## 2020-06-12 NOTE — Assessment & Plan Note (Signed)
Take ibuprofen as needed.

## 2020-06-28 ENCOUNTER — Ambulatory Visit: Admitting: Physical Therapy

## 2020-07-05 ENCOUNTER — Encounter: Admitting: Surgery

## 2020-07-06 ENCOUNTER — Encounter: Admitting: Surgery

## 2020-07-26 ENCOUNTER — Encounter: Admitting: Surgery

## 2020-08-09 ENCOUNTER — Encounter: Payer: Self-pay | Admitting: Surgery

## 2020-08-09 ENCOUNTER — Other Ambulatory Visit: Payer: Self-pay

## 2020-08-09 ENCOUNTER — Institutional Professional Consult (permissible substitution) (INDEPENDENT_AMBULATORY_CARE_PROVIDER_SITE_OTHER): Payer: No Typology Code available for payment source | Admitting: Surgery

## 2020-08-09 ENCOUNTER — Ambulatory Visit: Admitting: Obstetrics and Gynecology

## 2020-08-09 VITALS — BP 137/90 | HR 87 | Resp 20 | Ht 65.0 in | Wt 216.0 lb

## 2020-08-09 DIAGNOSIS — I712 Thoracic aortic aneurysm, without rupture, unspecified: Secondary | ICD-10-CM

## 2020-08-09 NOTE — Progress Notes (Signed)
Cardiothoracic Surgery Consultation  PCP is [facility] Referring Provider is [place], Gean Quint, [place]*  Chief Complaint  Patient presents with  . 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  The CT scan was done due to some left-sided chest pain.  It was felt to be new compared to a prior CT scan of the chest done on 11/05/2018.  She has no prior history of cardiac disease.  There is no family history of connective tissue disorder, aortic aneurysm, or aortic dissection.  Past Medical History:  Diagnosis Date  . Abnormal uterine bleeding 03/23/2019  . Anemia   . Anxiety   . Bilateral plantar fasciitis   . Chronic headaches   . Fibroid   . Fibromyalgia   . GERD (gastroesophageal reflux disease)   . Gestational diabetes   . Herniated disc, cervical   . History of reduction mammoplasty 03/23/2019  . Iron deficiency anemia due to chronic blood loss 03/23/2019  . Menometrorrhagia 03/23/2019  . Migraine   . Patellofemoral syndrome of left knee   . Patellofemoral syndrome of right knee   . Pregnancy induced hypertension   . PTSD (post-traumatic stress disorder)   . Pyelonephritis 03/23/2019  . Sleep apnea     Past Surgical History:  Procedure Laterality Date  . APPENDECTOMY     childhood  . BREAST REDUCTION SURGERY  2009  . CHOLECYSTECTOMY    . DILATION AND CURETTAGE OF UTERUS    . LIPOSUCTION MULTIPLE BODY PARTS    . TONSILLECTOMY     childhood    Family History  Problem Relation Age of Onset  . Hypertension Mother   . Diabetes Mother   . Kidney failure Mother   . Healthy Father   . Aneurysm Father        brain    Social History Social  History   Tobacco Use  . Smoking status: Never Smoker  . Smokeless tobacco: Never Used  Vaping Use  . Vaping Use: Never used  Substance Use Topics  . Alcohol use: No  . Drug use: No    Current Outpatient Medications  Medication Sig Dispense Refill  . acetaminophen (TYLENOL) 500 MG tablet Take 500 mg by mouth in the morning and at bedtime.    . Calcium Carbonate-Vit D-Min (CALCIUM 1200 PO) Take by mouth.    . cetirizine (ZYRTEC) 10 MG tablet Take 10 mg by mouth at bedtime.    . Cyanocobalamin (VITAMIN B-12 PO) Take by mouth.    . diclofenac Sodium (VOLTAREN) 1 % GEL Apply 4 g topically 4 (four) times daily as needed.    Marland Kitchen escitalopram (LEXAPRO) 10 MG tablet Take 10 mg by mouth daily.    Marland Kitchen estrogens, conjugated, (PREMARIN) 0.625 MG tablet Take 0.625 mg by mouth daily. Take daily for 21 days then do not take for 7 days.    . famotidine (PEPCID) 40 MG tablet Take 40 mg by mouth at bedtime.    . Ferrous Gluconate (IRON 27 PO) Take by mouth.    Marland Kitchen FLUTICASONE PROPIONATE, NASAL, NA Place 50 mcg into the nose.    . hydrocortisone 2.5 % ointment Apply topically 2 (  two) times daily.    Marland Kitchen ibuprofen (ADVIL) 600 MG tablet Take 600 mg by mouth 3 (three) times daily.    Marland Kitchen ipratropium (ATROVENT) 0.03 % nasal spray Place 2 sprays into both nostrils every 12 (twelve) hours.    . methocarbamol (ROBAXIN) 500 MG tablet Take 500 mg by mouth 3 (three) times daily.    . mirtazapine (REMERON) 15 MG tablet Take 15 mg by mouth at bedtime.    . montelukast (SINGULAIR) 10 MG tablet Take 10 mg by mouth at bedtime.    . pantoprazole (PROTONIX) 40 MG tablet Take 40 mg by mouth daily.    Marland Kitchen senna (SENOKOT) 8.6 MG TABS tablet Take 1 tablet (8.6 mg total) by mouth daily. 120 tablet 0  . vitamin C (ASCORBIC ACID) 250 MG tablet Take 500 mg by mouth 2 (two) times daily.    . Prenatal Vit-Fe Fumarate-FA (PRENATAL MULTIVITAMIN) TABS tablet Take 1 tablet by mouth daily at 12 noon.     No current facility-administered  medications for this visit.    No Known Allergies  Review of Systems  Constitutional: Positive for fatigue.       Weight gain  HENT:       Painful teeth  Eyes:       Blurry vision, floaters, wears glasses  Respiratory: Negative for shortness of breath.   Cardiovascular: Negative.   Gastrointestinal: Negative.   Endocrine: Negative.   Genitourinary: Negative.   Musculoskeletal: Positive for arthralgias, gait problem, joint swelling and myalgias.  Skin:       Itching  Neurological: Positive for headaches.       Chronic pain, memory problems, numbness in hands or feet  Hematological: Bruises/bleeds easily.  Psychiatric/Behavioral: The patient is nervous/anxious.        Depression    BP 137/90 (BP Location: Left Arm, Patient Position: Sitting)   Pulse 87   Resp 20   Ht 5\' 5"  (1.651 m)   Wt 216 lb (98 kg)   SpO2 96% Comment: RA  BMI 35.94 kg/m  Physical Exam Constitutional:      Appearance: Normal appearance.  HENT:     Head: Normocephalic and atraumatic.  Eyes:     Extraocular Movements: Extraocular movements intact.     Pupils: Pupils are equal, round, and reactive to light.  Neck:     Vascular: No carotid bruit.  Cardiovascular:     Rate and Rhythm: Normal rate and regular rhythm.     Heart sounds: Normal heart sounds. No murmur heard.   Pulmonary:     Effort: Pulmonary effort is normal.     Breath sounds: Normal breath sounds.  Musculoskeletal:        General: No swelling.  Skin:    General: Skin is warm and dry.  Neurological:     General: No focal deficit present.     Mental Status: She is alert and oriented to person, place, and time.  Psychiatric:        Mood and Affect: Mood normal.        Behavior: Behavior normal.      Diagnostic Tests:  CTA chest from 05/30/2020 and CT chest without contrast from 2020 reviewed on disc.  Impression:  This 50 year old woman has a 4.3 cm fusiform ascending aortic aneurysm.  The ascending aorta was measured at  3.7 cm in 2020 although that was a noncontrast study and not as accurate.  The aorta is still well below the surgical threshold of 5.5 cm.  I  reviewed the CTA images with her and her husband and answered their questions.  I stressed the importance of continued good blood pressure control in preventing further enlargement and acute aortic dissection.  I have recommended that she have a follow-up CTA of the chest 1 year after the last study.  That can be done in the [place] system and I will be happy to see her afterward to review the results with her.  Plan:  Follow-up CTA of the chest in 1 year in the [place] system and I will see her after that.  I spent 45 minutes performing this consultation and > 50% of this time was spent face to face counseling and coordinating the care of this patient's ascending aortic aneurysm.   Gaye Pollack, MD [facility] [phone]

## 2020-08-22 ENCOUNTER — Ambulatory Visit: Payer: No Typology Code available for payment source | Attending: Certified Nurse Midwife | Admitting: Physical Therapy

## 2020-08-22 ENCOUNTER — Other Ambulatory Visit: Payer: Self-pay

## 2020-08-22 DIAGNOSIS — R279 Unspecified lack of coordination: Secondary | ICD-10-CM | POA: Insufficient documentation

## 2020-08-22 DIAGNOSIS — M6281 Muscle weakness (generalized): Secondary | ICD-10-CM | POA: Diagnosis not present

## 2020-08-22 NOTE — Patient Instructions (Addendum)
Toileting Techniques for Bowel Movements (Defecation) Using your belly (abdomen) and pelvic floor muscles to have a bowel movement is usually instinctive.  Sometimes people can have problems with these muscles and have to relearn proper defecation (emptying) techniques.  If you have weakness in your muscles, organs that are falling out, decreased sensation in your pelvis, or ignore your urge to go, you may find yourself straining to have a bowel movement.  You are straining if you are: . holding your breath or taking in a huge gulp of air and holding it  . keeping your lips and jaw tensed and closed tightly . turning red in the face because of excessive pushing or forcing . developing or worsening your  hemorrhoids . getting faint while pushing . not emptying completely and have to defecate many times a day  If you are straining, you are actually making it harder for yourself to have a bowel movement.  Many people find they are pulling up with the pelvic floor muscles and closing off instead of opening the anus. Due to lack pelvic floor relaxation and coordination the abdominal muscles, one has to work harder to push the feces out.  Many people have never been taught how to defecate efficiently and effectively.  Notice what happens to your body when you are having a bowel movement.  While you are sitting on the toilet pay attention to the following areas: . Jaw and mouth position . Angle of your hips   . Whether your feet touch the ground or not . Arm placement  . Spine position . Waist . Belly tension . Anus (opening of the anal canal)  An Evacuation/Defecation Plan   Here are the 4 basic points:  1. Lean forward enough for your elbows to rest on your knees 2. Support your feet on the floor or use a low stool if your feet don't touch the floor  3. Push out your belly as if you have swallowed a beach ball--you should feel a widening of your waist 4. Open and relax your pelvic floor  muscles, rather than tightening around the anus       The following conditions my require modifications to your toileting posture:  . If you have had surgery in the past that limits your back, hip, pelvic, knee or ankle flexibility . Constipation   Your healthcare practitioner may make the following additional suggestions and adjustments:  1) Sit on the toilet  a) Make sure your feet are supported. b) Notice your hip angle and spine position--most people find it effective to lean forward or raise their knees, which can help the muscles around the anus to relax  c) When you lean forward, place your forearms on your thighs for support  2) Relax suggestions a) Breath deeply in through your nose and out slowly through your mouth as if you are smelling the flowers and blowing out the candles. b) To become aware of how to relax your muscles, contracting and releasing muscles can be helpful.  Pull your pelvic floor muscles in tightly by using the image of holding back gas, or closing around the anus (visualize making a circle smaller) and lifting the anus up and in.  Then release the muscles and your anus should drop down and feel open. Repeat 5 times ending with the feeling of relaxation. c) Keep your pelvic floor muscles relaxed; let your belly bulge out. d) The digestive tract starts at the mouth and ends at the anal opening, so  be sure to relax both ends of the tube.  Place your tongue on the roof of your mouth with your teeth separated.  This helps relax your mouth and will help to relax the anus at the same time.  3) Empty (defecation) a) Keep your pelvic floor and sphincter relaxed, then bulge your anal muscles.  Make the anal opening wide.  b) Stick your belly out as if you have swallowed a beach ball. c) Make your belly wall hard using your belly muscles while continuing to breathe. Doing this makes it easier to open your anus. d) Breath out and give a grunt (or try using other sounds  such as ahhhh, shhhhh, ohhhh or grrrrrrr).  4) Finish a) As you finish your bowel movement, pull the pelvic floor muscles up and in.  This will leave your anus in the proper place rather than remaining pushed out and down. If you leave your anus pushed out and down, it will start to feel as though that is normal and give you incorrect signals about needing to have a bowel movement.

## 2020-08-22 NOTE — Therapy (Signed)
[facility] [address], [place], [place], [address] Phone: [phone]   Fax:  [phone]  Physical Therapy Evaluation  Patient Details  Name: Melissa Medina MRN: [id number] Date of Birth: 04-25-1970 Referring Provider (PT): Lajean Manes, CNM   Encounter Date: 08/22/2020   PT End of Session - 08/22/20 0805    Visit Number 1    Date for PT Re-Evaluation 11/14/20    Authorization Type U[employer]    PT Start Time 0801    PT Stop Time 0840    PT Time Calculation (min) 39 min    Activity Tolerance Patient tolerated treatment well    Behavior During Therapy [facility] for tasks assessed/performed           Past Medical History:  Diagnosis Date  . Abnormal uterine bleeding 03/23/2019  . Anemia   . Anxiety   . Bilateral plantar fasciitis   . Chronic headaches   . Fibroid   . Fibromyalgia   . GERD (gastroesophageal reflux disease)   . Gestational diabetes   . Herniated disc, cervical   . History of reduction mammoplasty 03/23/2019  . Iron deficiency anemia due to chronic blood loss 03/23/2019  . Menometrorrhagia 03/23/2019  . Migraine   . Patellofemoral syndrome of left knee   . Patellofemoral syndrome of right knee   . Pregnancy induced hypertension   . PTSD (post-traumatic stress disorder)   . Pyelonephritis 03/23/2019  . Sleep apnea     Past Surgical History:  Procedure Laterality Date  . APPENDECTOMY     childhood  . BREAST REDUCTION SURGERY  2009  . CHOLECYSTECTOMY    . DILATION AND CURETTAGE OF UTERUS    . LIPOSUCTION MULTIPLE BODY PARTS    . TONSILLECTOMY     childhood    There were no vitals filed for this visit.    Subjective Assessment - 08/22/20 0805    Subjective Pt states she leaks all the time.  Pt feels discomfort early in morning or standing or sitting    Pertinent History forceps delivery 02/10/21;    Limitations Sitting;Standing;Lifting    How long can you sit comfortably? 20  minutes    How long can you stand comfortably? 10 minutes              [facility] PT Assessment - 08/22/20 0001      Assessment   Medical Diagnosis N81.9,R32 (ICD-10-CM) - Urinary incontinence concurrent with and due to female genital prolapse; R10.2 (ICD-10-CM) - Pelvic pain in female    Referring Provider (PT) Lajean Manes, CNM      Balance Screen   Has the patient fallen in the past 6 months No      [place] residence    Living Arrangements Spouse/significant other   baby -has 2 adult children not at home     Prior Function   Level of Independence Independent    Vocation Full time employment      Cognition   Overall Cognitive Status Within Functional Limits for tasks assessed      Functional Tests   Functional tests Single leg stance;Squat      Squat   Comments 25% before leaks      Single Leg Stance   Comments shaky bil      Posture/Postural Control   Posture/Postural Control No significant limitations      ROM / Strength   AROM / PROM / Strength AROM;PROM;Strength  AROM   Overall AROM Comments lumbar 30% due to feeling like she will leak      Flexibility   Soft Tissue Assessment /Muscle Length yes                      Objective measurements completed on examination: See above findings.     Pelvic Floor Special Questions - 08/22/20 0001    Prior Pelvic/Prostate Exam Yes    Prior Pregnancies Yes    Number of Pregnancies 3    Number of Vaginal Deliveries 3    Any difficulty with labor and deliveries Yes    Currently Sexually Active No    Marinoff Scale --   don't want to because worried about leakage   Urinary Leakage Yes    How often throughout the day    Pad use about 3/day    Activities that cause leaking Coughing;Sneezing;Bending;Lifting;With strong urge;Laughing   everything causes   Urinary urgency Yes    Fecal incontinence --   sometimes and straining   Fluid intake less because of  feeling full bladder cause leakage - 20-25oz    Falling out feeling (prolapse) Yes    Activities that cause feeling of prolapse early, standing, sitting                         PT Long Term Goals - 08/22/20 1000      PT LONG TERM GOAL #1   Title Pt will be ind with advanced HEP    Time 12    Period Weeks    Status New    Target Date 11/14/20      PT LONG TERM GOAL #2   Title Pt will be able to stand for at least 30 minute without having to sit to due to discomfort    Time 12    Period Weeks    Status New    Target Date 11/14/20      PT LONG TERM GOAL #3   Title Pt will be able to cough, laugh, sneeze without leakage due to improved coordination and strength    Time 12    Period Weeks    Status New    Target Date 11/14/20      PT LONG TERM GOAL #4   Title Pt will report she is able to drink the amount of water she did prior to delivery due to improved bladder control    Time 12    Period Weeks    Status New    Target Date 11/14/20      PT LONG TERM GOAL #5   Title Pt will be able to perform 2/5 MMT of pelvic floor and hold at least 4 seconds    Baseline 1/5 for <1 seconds    Time 12    Period Weeks    Status New    Target Date 11/14/20                  Plan - 08/22/20 0947    Clinical Impression Statement Pt presents to skilled PT after a traumatic vaginal delivery with forceps that caused significant perineal tear that appears to have gone through the perineal body.  Pt has 1/5 to 0/5 MMT with no sensation of transverse peroneus muscle engaging.  Pt has core weakness, decreased ribcage movement and flares ribcage when activating the pelvic floor.  Pt has tight lumbar, thoracic, hamstrings, and gluteals with Rt>Lt.  Pt has little AROM of lumbar spine mostly likely due to leakage occuring every time she bends fwd or side.  Squatting can only do 25% before discomfort.  Pt has low back pain as well that has been severe and constant since the delivery  of her infant.  Pt will benefit from skilled PT to address muscle and soft tissue impairments as well as neural muscle coordination and activation for improved function and reduced pain to improved quality of life.    Personal Factors and Comorbidities Comorbidity 3+    Comorbidities fibromyalgia, forceps delivery, tearing through peroneal body, chronic low back pain    Examination-Activity Limitations Lift;Squat;Stand;Toileting;Continence;Carry;Caring for Others;Bend;Sit;Transfers    Examination-Participation Restrictions Community Activity;Interpersonal Relationship    Stability/Clinical Decision Making Evolving/Moderate complexity    Clinical Decision Making Moderate    Rehab Potential Excellent    PT Frequency 2x / week    PT Duration 12 weeks    PT Treatment/Interventions ADLs/Self Care Home Management;Biofeedback;Cryotherapy;Electrical Stimulation;Therapeutic activities;Therapeutic exercise;Neuromuscular re-education;Patient/family education;Manual techniques;Passive range of motion;Dry needling;Taping    PT Next Visit Plan discuss estim ikegel; back, hip, h/s flexibility and movement, urge techniques    PT Home Exercise Plan toileting and TC with kegel (attempt)    Consulted and Agree with Plan of Care Patient           Patient will benefit from skilled therapeutic intervention in order to improve the following deficits and impairments:  Increased fascial restricitons,Decreased strength,Decreased coordination,Decreased range of motion,Pain,Postural dysfunction,Impaired flexibility,Increased muscle spasms  Visit Diagnosis: Muscle weakness (generalized)  Unspecified lack of coordination     Problem List Patient Active Problem List   Diagnosis Date Noted  . Urinary incontinence 06/12/2020  . Elevated ALT measurement 02/21/2020  . Forceps delivery 02/21/2020  . Type 3a perineal laceration 02/21/2020  . History of gestational diabetes 01/05/2020  . Obesity 08/25/2019  . Acute  nonintractable headache 08/17/2019  . Bilateral groin pain 08/17/2019  . Bilateral fibrocystic breast changes 03/23/2019  . Depression 03/23/2019  . Chronic low back pain 03/23/2019  . Gastroesophageal reflux disease 03/23/2019  . History of cervical dysplasia 03/23/2019  . Restless legs syndrome 03/23/2019  . Sleep apnea 03/23/2019  . Vitamin D deficiency 03/23/2019  . Posttraumatic stress disorder 03/23/2019  . Anxiety 11/21/2017  . Fibromyalgia 11/21/2017  . Pain syndrome, chronic 01/08/2017  . Uterine fibroid 03/25/2008    Jule Ser, PT 08/22/2020, 10:05 AM  [facility] [address] [place], [place], [address] Phone: [phone]   Fax:  [phone]  Name: Sebastiana Wuest MRN: [id number] Date of Birth: July 15, 1970

## 2020-09-01 ENCOUNTER — Ambulatory Visit: Payer: No Typology Code available for payment source | Attending: Certified Nurse Midwife | Admitting: Physical Therapy

## 2020-10-13 ENCOUNTER — Telehealth: Payer: Self-pay

## 2020-10-13 NOTE — Telephone Encounter (Signed)
Melissa Medina is a 50 y.o. female was called and contacted re: New pt Pre appt call to collect history information. Pt verified using 2 identifiers. Confirmation that I am speaking with the correct person.  Chart was updated: -Allergy -Medication -Confirm pharmacy -OB history  Pt was notified to arrive 15 min early and we will need a urine sample when she arrives. Pt verbalized understanding.

## 2020-10-17 ENCOUNTER — Encounter: Payer: Self-pay | Admitting: Obstetrics and Gynecology

## 2020-10-17 ENCOUNTER — Ambulatory Visit (INDEPENDENT_AMBULATORY_CARE_PROVIDER_SITE_OTHER): Payer: No Typology Code available for payment source | Admitting: Obstetrics and Gynecology

## 2020-10-17 ENCOUNTER — Other Ambulatory Visit: Payer: Self-pay

## 2020-10-17 VITALS — BP 138/88 | HR 89 | Ht 65.0 in | Wt 216.0 lb

## 2020-10-17 DIAGNOSIS — R350 Frequency of micturition: Secondary | ICD-10-CM | POA: Diagnosis not present

## 2020-10-17 DIAGNOSIS — N8184 Pelvic muscle wasting: Secondary | ICD-10-CM

## 2020-10-17 DIAGNOSIS — N3281 Overactive bladder: Secondary | ICD-10-CM

## 2020-10-17 DIAGNOSIS — R159 Full incontinence of feces: Secondary | ICD-10-CM

## 2020-10-17 DIAGNOSIS — N393 Stress incontinence (female) (male): Secondary | ICD-10-CM

## 2020-10-17 LAB — POCT URINALYSIS DIPSTICK
Appearance: NORMAL
Bilirubin, UA: NEGATIVE
Blood, UA: NEGATIVE
Glucose, UA: NEGATIVE
Ketones, UA: NEGATIVE
Leukocytes, UA: NEGATIVE
Nitrite, UA: NEGATIVE
Protein, UA: NEGATIVE
Spec Grav, UA: 1.025 (ref 1.010–1.025)
Urobilinogen, UA: 0.2 E.U./dL
pH, UA: 5.5 (ref 5.0–8.0)

## 2020-10-17 MED ORDER — MIRABEGRON ER 25 MG PO TB24: 25.0000 mg | ORAL_TABLET | Freq: Every day | ORAL | 5 refills | Status: DC

## 2020-10-17 NOTE — Progress Notes (Signed)
[facility] New Patient Evaluation and Consultation  Referring Provider: Donnamae Jude, MD PCP: [facility] Date of Service: 10/17/2020  SUBJECTIVE Chief Complaint: New Patient (Initial Visit) (Dr Kennon Rounds referral for incontinence)  History of Present Illness: Melissa Medina is a 50 y.o. White or Caucasian female seen in consultation at the request of Dr. Kennon Rounds for evaluation of incontinence.    Review of records from Dr Kennon Rounds significant for: S/p forceps assisted vaginal delivery on 02/21/20 with a 3rd degree laceration. Has had urinary incontinence since delivery. Has started pelvic floor physical therapy.  Urinary Symptoms: Leaks urine with cough/ sneeze, laughing, exercise, lifting, going from sitting to standing, with a full bladder, with movement to the bathroom, with urgency, without sensation, and continuously Feels like she is constantly leaking. Had some leakage with lifting and bending prior to delivery but it was rare. Did not previously need to use pads.  Pad use: 3  adult diapers per day.   She is bothered by her UI symptoms.  Day time voids 6-7.  Nocturia: 2 times per night to void. Voiding dysfunction: she does not empty her bladder well.  does not use a catheter to empty bladder.  When urinating, she feels the need to urinate multiple times in a row Drinks: mostly water, gatorade  UTIs:  0  UTI's in the last year.   Reports history of kidney or bladder stones and bladder cancer  Pelvic Organ Prolapse Symptoms:                  She Denies a feeling of a bulge the vaginal area. Sometimes feels pressure  Bowel Symptom: Bowel movements: 2 time(s) per day Stool consistency: hard or soft  Straining: no.  Splinting: no.  Incomplete evacuation: yes.  She Admits to accidental bowel leakage / fecal incontinence  Occurs: every day- will leak with urinating  Consistency with leakage: hard or soft  Bowel regimen: miralax Last colonoscopy: Date- a  few years ago, Results normal  Sexual Function Sexually active: yes.  Sexual orientation: Straight Pain with sex: Yes  Pelvic Pain Admits to pelvic pain Location: in the hips Pain occurs: all the time Prior pain treatment: started physical therapy   Past Medical History:  Past Medical History:  Diagnosis Date   Abnormal uterine bleeding 03/23/2019   Anemia    Anxiety    Bilateral plantar fasciitis    Chronic headaches    Fibroid    Fibromyalgia    GERD (gastroesophageal reflux disease)    Gestational diabetes    Herniated disc, cervical    History of reduction mammoplasty 03/23/2019   Iron deficiency anemia due to chronic blood loss 03/23/2019   Menometrorrhagia 03/23/2019   Migraine    Patellofemoral syndrome of left knee    Patellofemoral syndrome of right knee    Pregnancy induced hypertension    PTSD (post-traumatic stress disorder)    Pyelonephritis 03/23/2019   Sleep apnea      Past Surgical History:   Past Surgical History:  Procedure Laterality Date   APPENDECTOMY     childhood   BREAST REDUCTION SURGERY  2009   CHOLECYSTECTOMY     DILATION AND CURETTAGE OF UTERUS     LIPOSUCTION MULTIPLE BODY PARTS     TONSILLECTOMY     childhood     Past OB/GYN History: OB History  Gravida Para Term Preterm AB Living  '3 3 3     3  '$ SAB IAB Ectopic Multiple Live Births  3    # Outcome Date GA Lbr Len/2nd Weight Sex Delivery Anes PTL Lv  3 Term 02/21/20 [redacted]w[redacted]d 6 lb 8 oz (2.948 kg)  Vag-Forceps   LIV  2 Term 1993   7 lb 8 oz (3.402 kg)  Vag-Spont   LIV  1 Term 1990   7 lb 8 oz (3.402 kg)  Vag-Spont   LIV    Menopausal: No, LMP Patient's last menstrual period was 09/26/2020. Contraception: abstinence   Medications: She has a current medication list which includes the following prescription(s): acetaminophen, calcium carbonate-vit d-min, cyanocobalamin, diclofenac sodium, estradiol, ferrous gluconate, hydrocortisone, ibuprofen, ipratropium, mirabegron  er, prenatal multivitamin, and vitamin c.   Allergies: Patient has No Known Allergies.   Social History:  Social History   Tobacco Use   Smoking status: Never   Smokeless tobacco: Never  Vaping Use   Vaping Use: Never used  Substance Use Topics   Alcohol use: No   Drug use: No    Relationship status: married She lives with husband and son.   She is employed. Regular exercise: No History of abuse: No  Family History:   Family History  Problem Relation Age of Onset   Hypertension Mother    Diabetes Mother    Kidney failure Mother    Healthy Father    Aneurysm Father        brain     Review of Systems: Review of Systems  Constitutional:  Negative for fever, malaise/fatigue and weight loss.  Respiratory:  Negative for cough, shortness of breath and wheezing.   Cardiovascular:  Positive for chest pain and palpitations. Negative for leg swelling.  Gastrointestinal:  Negative for abdominal pain and blood in stool.  Genitourinary:  Negative for dysuria.  Musculoskeletal:  Positive for myalgias.  Skin:  Negative for rash.  Neurological:  Negative for dizziness and headaches.  Endo/Heme/Allergies:  Does not bruise/bleed easily.       + hot flashes  Psychiatric/Behavioral:  Positive for depression. The patient is nervous/anxious.     OBJECTIVE Physical Exam: Vitals:   10/17/20 1313  BP: 138/88  Pulse: 89  Weight: 216 lb (98 kg)  Height: '5\' 5"'$  (1.651 m)    Physical Exam Constitutional:      General: She is not in acute distress. Pulmonary:     Effort: Pulmonary effort is normal.  Abdominal:     General: There is no distension.     Palpations: Abdomen is soft.     Tenderness: There is no abdominal tenderness. There is no rebound.  Musculoskeletal:        General: No swelling. Normal range of motion.  Skin:    General: Skin is warm and dry.     Findings: No rash.  Neurological:     Mental Status: She is alert and oriented to person, place, and time.   Psychiatric:        Mood and Affect: Mood normal.        Behavior: Behavior normal.     GU / Detailed Urogynecologic Evaluation:  Pelvic Exam: Normal external female genitalia; Bartholin's and Skene's glands normal in appearance; urethral meatus normal in appearance, no urethral masses or discharge.   CST: negative  Speculum exam reveals normal vaginal mucosa without atrophy. Cervix normal appearance. Uterus normal single, nontender. Adnexa no mass, fullness, tenderness.     Pelvic floor strength I/V, puborectalis I/V external anal sphincter III/V  Pelvic floor musculature: Right levator non-tender, Right obturator tender, Left levator tender, Left  obturator tender  POP-Q:   POP-Q  -2                                            Aa   -2                                           Ba  -7.5                                              C   4                                            Gh  3                                            Pb  10.5                                            tvl   -2                                            Ap  -2                                            Bp  -9.5                                              D     Rectal Exam:  Normal sphincter tone, moderate distal rectocele, enterocoele not present, no rectal masses  Post-Void Residual (PVR) by Bladder Scan: In order to evaluate bladder emptying, we discussed obtaining a postvoid residual and she agreed to this procedure.  Procedure: The ultrasound unit was placed on the patient's abdomen in the suprapubic region after the patient had voided. A PVR of 18 ml was obtained by bladder scan.  Laboratory Results: POC urine: negative   ASSESSMENT AND PLAN Ms. Karan is a 50 y.o. with:  1. Overactive bladder   2. Urinary frequency   3. SUI (stress urinary incontinence, female)   4. Pelvic muscle wasting   5. Full incontinence of feces    OAB We discussed the symptoms of overactive  bladder (OAB), which include urinary urgency, urinary frequency, nocturia, with or without urge incontinence.  While we do not know the exact etiology of OAB, several treatment options exist. We discussed management including behavioral therapy (decreasing bladder irritants, urge suppression  strategies, timed voids, bladder retraining), physical therapy, medication; for refractory cases posterior tibial nerve stimulation, sacral neuromodulation, and intravesical botulinum toxin injection.  For Beta-3 agonist medication, we discussed the potential side effect of elevated blood pressure which is more likely to occur in individuals with uncontrolled hypertension. - We discussed that she would be a good candidate for sacral neuromodulation due to traumatic delivery and likely impact on pelvic nerves. This would also help with her FI. However, would wait until she has completed physical therapy to see if she really needs this procedure. Can still see improvements in pelvic floor strength and neuromuscular recovery at this point postpartum.  - In the meantime, will start medication. Prescribed Myrbetriq '25mg'$  daily.   2. SUI For treatment of stress urinary incontinence,  non-surgical options include expectant management, weight loss, physical therapy, as well as a pessary.  Surgical options are also available.  - Will avoid surgery at this time. She will return for a pessary fitting. Continue PT.   3. Fecal incontinence - Treatment options include anti-diarrhea medication (loperamide/ Imodium OTC or prescription lomotil), fiber supplements, physical therapy, and possible sacral neuromodulation or surgery.   - She will start with psyllium fiber supplementation and PT.If no improvement, would consider SNM.   4. Pelvic floor muscle weakness - Significant weakness noted on exam today. Discussed that neuromuscular recovery after delivery can take several months and possibly more than a year. Therefore, will work  on conservative measures and consider surgery at a later date once she has been through physical therapy.   Follow up for pessary fitting.    Jaquita Folds, MD   Medical Decision Making:  - Reviewed/ ordered a clinical laboratory test - Review and summation of prior records

## 2020-10-17 NOTE — Patient Instructions (Addendum)
Accidental Bowel Leakage: Our goal is to achieve formed bowel movements daily or every-other-day without leakage.  You may need to try different combinations of the following options to find what works best for you.  Some management options include: Dietary changes (more leafy greens, vegetables and fruits; less processed foods) Fiber supplementation (Metamucil or something with psyllium as active ingredient) Over-the-counter imodium (tablets or liquid) to help solidify the stool and prevent leakage of stool. If you get constipated you can use Miralax as needed to achieve bowel movements.  We discussed the symptoms of overactive bladder (OAB), which include urinary urgency, urinary frequency, night-time urination, with or without urge incontinence.  We discussed management including behavioral therapy (decreasing bladder irritants by following a bladder diet, urge suppression strategies, timed voids, bladder retraining), physical therapy, medication; and for refractory cases posterior tibial nerve stimulation, sacral neuromodulation, and intravesical botulinum toxin injection.   For treatment of stress urinary incontinence, which is leakage with physical activity/movement/strainging/coughing, we discussed expectant management versus nonsurgical options versus surgery. Nonsurgical options include weight loss, physical therapy, as well as a pessary.  Surgical options are also available.

## 2020-10-18 ENCOUNTER — Other Ambulatory Visit: Payer: Self-pay | Admitting: Obstetrics and Gynecology

## 2020-10-18 DIAGNOSIS — N3281 Overactive bladder: Secondary | ICD-10-CM

## 2020-10-18 MED ORDER — SOLIFENACIN SUCCINATE 5 MG PO TABS: 5.0000 mg | ORAL_TABLET | Freq: Every day | ORAL | 5 refills | Status: AC

## 2020-10-18 NOTE — Progress Notes (Unsigned)
LM on the VM for the patient to call me back re: med change to Vesicare '5mg'$ 

## 2020-10-18 NOTE — Progress Notes (Unsigned)
Myrbetriq not covered. Ordered Vesicare '5mg'$  daily as alternative.

## 2020-11-07 NOTE — Progress Notes (Deleted)
[facility]   Subjective:     Chief Complaint: No chief complaint on file.  History of Present Illness: Melissa Medina is a 50 y.o. female with {PFD symptoms:24771} who presents today for a pessary fitting.    Past Medical History: Patient  has a past medical history of Abnormal uterine bleeding (03/23/2019), Anemia, Anxiety, Bilateral plantar fasciitis, Chronic headaches, Fibroid, Fibromyalgia, GERD (gastroesophageal reflux disease), Gestational diabetes, Herniated disc, cervical, History of reduction mammoplasty (03/23/2019), Iron deficiency anemia due to chronic blood loss (03/23/2019), Menometrorrhagia (03/23/2019), Migraine, Patellofemoral syndrome of left knee, Patellofemoral syndrome of right knee, Pregnancy induced hypertension, PTSD (post-traumatic stress disorder), Pyelonephritis (03/23/2019), and Sleep apnea.   Past Surgical History: She  has a past surgical history that includes Cholecystectomy; Appendectomy; Tonsillectomy; Breast reduction surgery (2009); Liposuction multiple body parts; and Dilation and curettage of uterus.   Medications: She has a current medication list which includes the following prescription(s): acetaminophen, calcium carbonate-vit d-min, cyanocobalamin, diclofenac sodium, estradiol, ferrous gluconate, hydrocortisone, ibuprofen, ipratropium, prenatal multivitamin, solifenacin, and vitamin c.   Allergies: Patient has No Known Allergies.   Social History: Patient  reports that she has never smoked. She has never used smokeless tobacco. She reports that she does not drink alcohol and does not use drugs.      Objective:    There were no vitals taken for this visit. Gen: No apparent distress, A&O x 3. Pelvic Exam: Normal external female genitalia; Bartholin's and Skene's glands normal in appearance; urethral meatus {urethra:24773}, no urethral masses or discharge.   A size *** {pessary type:24772} pessary was fitted. It was comfortable,  stayed in place with valsalva and was an appropriate size on examination, with one finger fitting between the pessary and the vaginal walls. We tied a string to it and the patient demonstrated proper removal and replacement.  No flowsheet data found.   Assessment/Plan:    Assessment: Melissa Medina is a 50 y.o. with {PFD symptoms:24771} who presents for a pessary fitting. Plan: She was fitted with a *** {pessary type:24772} pessary. She will {pessary plan:24776}. She will {use:24778} {lubricant:24777}.   Follow-up in *** {days/wks/mos/yrs:310907} for a pessary check or sooner as needed.  All questions were answered.    Jaquita Folds, MD

## 2020-11-08 ENCOUNTER — Ambulatory Visit: Payer: No Typology Code available for payment source | Admitting: Obstetrics and Gynecology

## 2020-11-29 ENCOUNTER — Ambulatory Visit: Payer: No Typology Code available for payment source | Admitting: Obstetrics and Gynecology

## 2020-12-25 NOTE — Progress Notes (Deleted)
[facility]   Subjective:     Chief Complaint: No chief complaint on file.  History of Present Illness: Bellatrix Devonshire is a 50 y.o. female with stress incontinence and OAB who presents today for a pessary fitting.   Myrbetriq was not covered by insurance- switched to vesicare. Would like to try a pessary for her SUI.   Past Medical History: Patient  has a past medical history of Abnormal uterine bleeding (03/23/2019), Anemia, Anxiety, Bilateral plantar fasciitis, Chronic headaches, Fibroid, Fibromyalgia, GERD (gastroesophageal reflux disease), Gestational diabetes, Herniated disc, cervical, History of reduction mammoplasty (03/23/2019), Iron deficiency anemia due to chronic blood loss (03/23/2019), Menometrorrhagia (03/23/2019), Migraine, Patellofemoral syndrome of left knee, Patellofemoral syndrome of right knee, Pregnancy induced hypertension, PTSD (post-traumatic stress disorder), Pyelonephritis (03/23/2019), and Sleep apnea.   Past Surgical History: She  has a past surgical history that includes Cholecystectomy; Appendectomy; Tonsillectomy; Breast reduction surgery (2009); Liposuction multiple body parts; and Dilation and curettage of uterus.   Medications: She has a current medication list which includes the following prescription(s): acetaminophen, calcium carbonate-vit d-min, cyanocobalamin, diclofenac sodium, estradiol, ferrous gluconate, hydrocortisone, ibuprofen, ipratropium, prenatal multivitamin, solifenacin, and vitamin c.   Allergies: Patient has No Known Allergies.   Social History: Patient  reports that she has never smoked. She has never used smokeless tobacco. She reports that she does not drink alcohol and does not use drugs.      Objective:    There were no vitals taken for this visit. Gen: No apparent distress, A&O x 3. Pelvic Exam: Normal external female genitalia; Bartholin's and Skene's glands normal in appearance; urethral meatus  {urethra:24773}, no urethral masses or discharge.   A size *** {pessary type:24772} pessary was fitted. It was comfortable, stayed in place with valsalva and was an appropriate size on examination, with one finger fitting between the pessary and the vaginal walls. We tied a string to it and the patient demonstrated proper removal and replacement.  No flowsheet data found. POP-Q (10/17/20):    POP-Q   -2                                            Aa   -2                                           Ba   -7.5                                              C    4                                            Gh   3                                            Pb   10.5  tvl    -2                                            Ap   -2                                            Bp   -9.5                                              D       Assessment/Plan:    Assessment: Ms. Schelling is a 50 y.o. with {PFD symptoms:24771} who presents for a pessary fitting. Plan: She was fitted with a *** {pessary type:24772} pessary. She will {pessary plan:24776}. She will {use:24778} {lubricant:24777}.   Follow-up in *** {days/wks/mos/yrs:310907} for a pessary check or sooner as needed.  All questions were answered.    Jaquita Folds, MD

## 2020-12-26 ENCOUNTER — Ambulatory Visit: Payer: No Typology Code available for payment source | Admitting: Obstetrics and Gynecology

## 2021-02-05 ENCOUNTER — Ambulatory Visit: Payer: No Typology Code available for payment source | Admitting: Obstetrics and Gynecology

## 2021-09-03 IMAGING — US US OB TRANSVAGINAL
2 series · 15 of 28 positions shown · non-contrast
Comparison: Ultrasound 07/18/2019.

CLINICAL DATA: Pregnancy.  Vaginal bleeding.

EXAM:
OBSTETRIC <14 WK US AND TRANSVAGINAL OB US
TECHNIQUE: Both transabdominal and transvaginal ultrasound examinations were
performed for complete evaluation of the gestation as well as the
maternal uterus, adnexal regions, and pelvic cul-de-sac.
Transvaginal technique was performed to assess early pregnancy.

[Series 1: us ob transvaginal · 14 of 56 slices shown (1 of 2)]
[im 1/56]
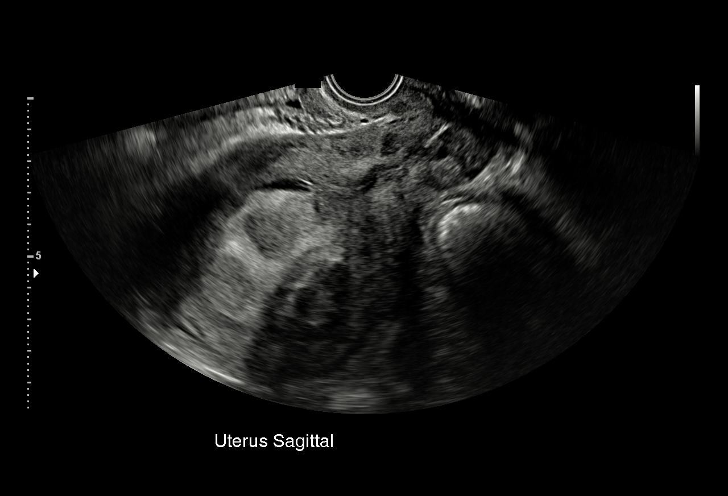
[im 5/56]
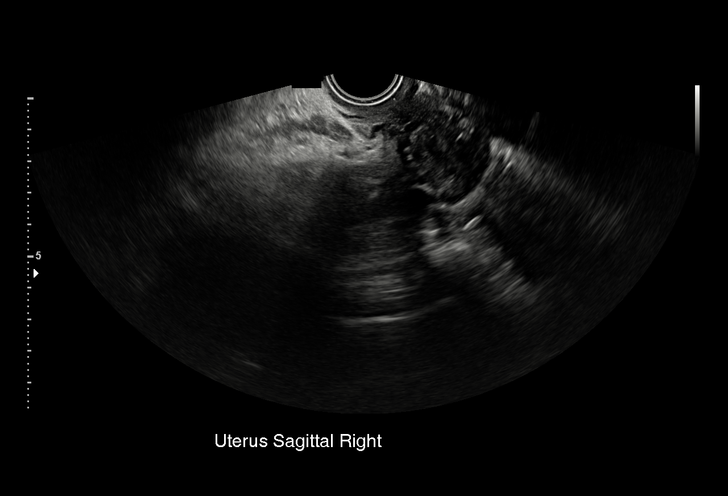
[im 9/56]
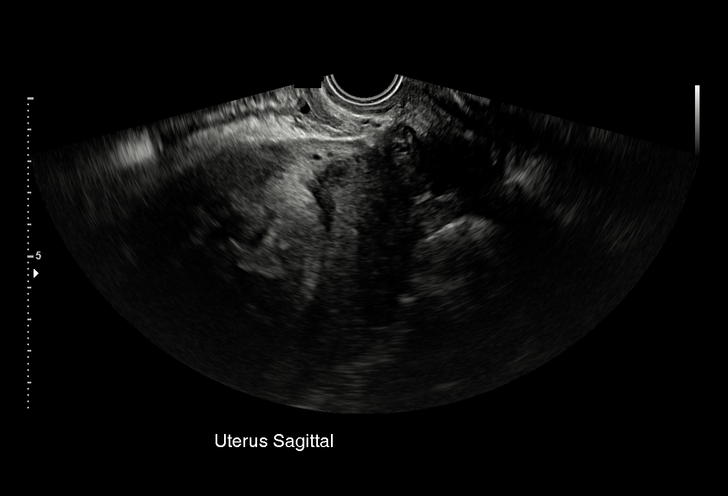
[im 13/56]
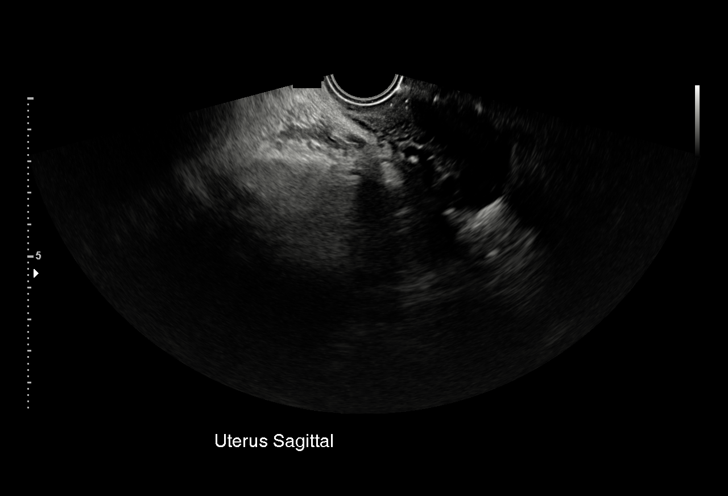
[im 17/56]
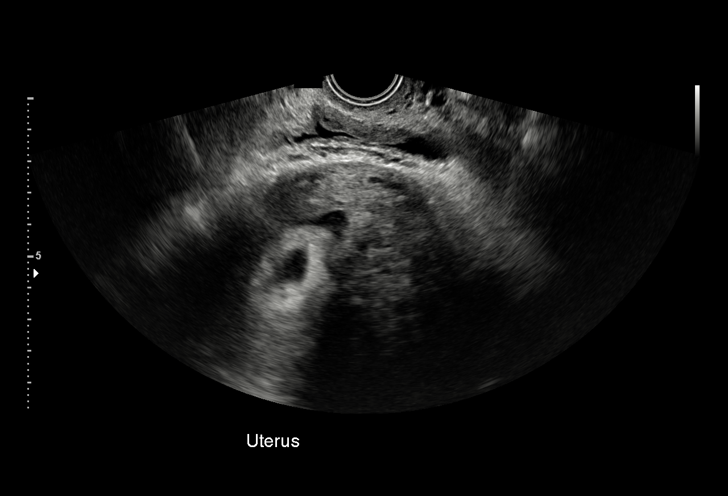
[im 22/56]
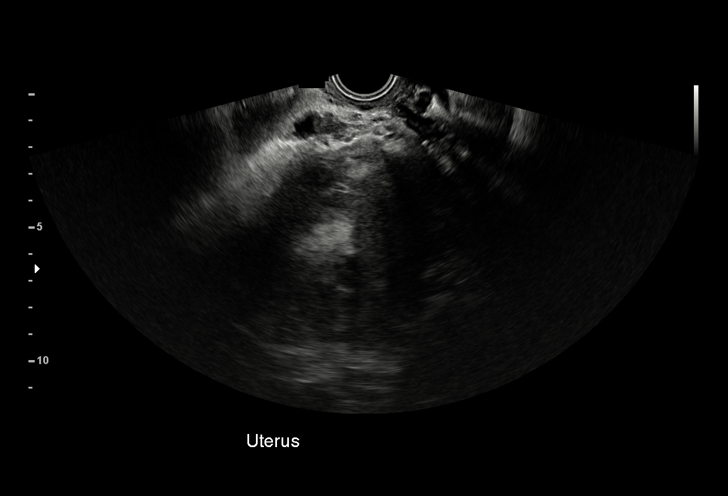
[im 26/56]
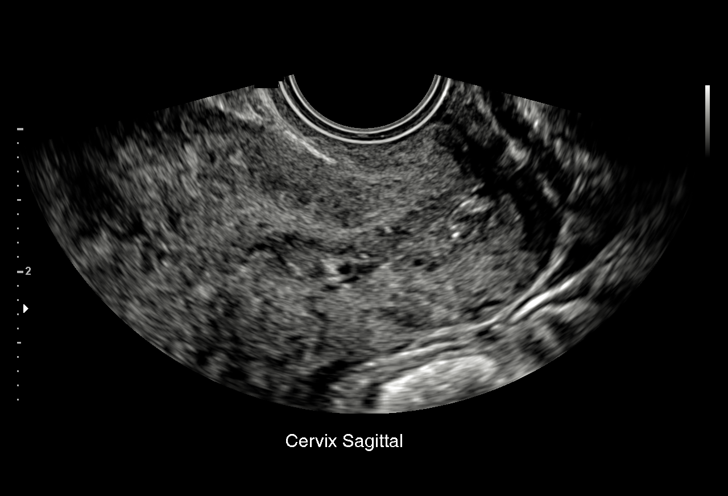
[im 30/56]
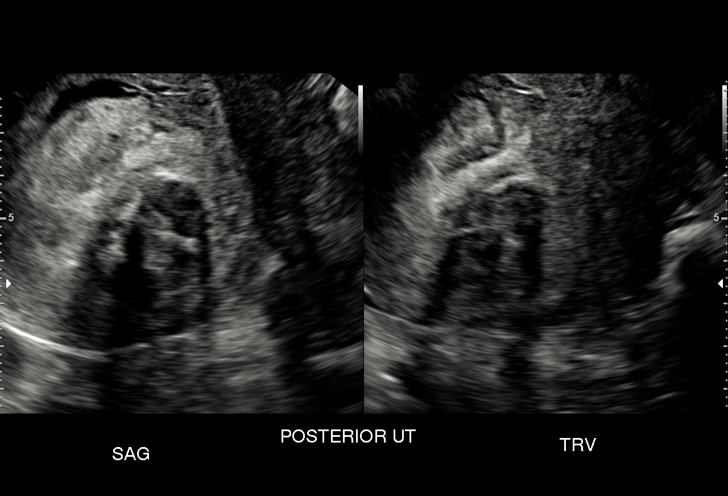
[im 32/56]
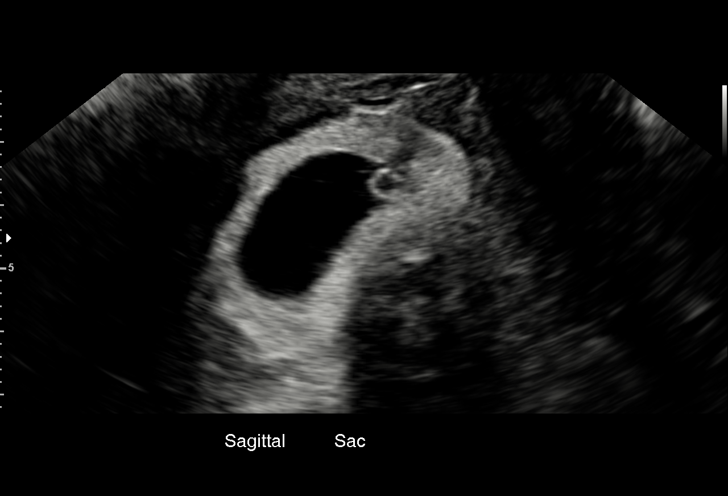
[im 36/56]
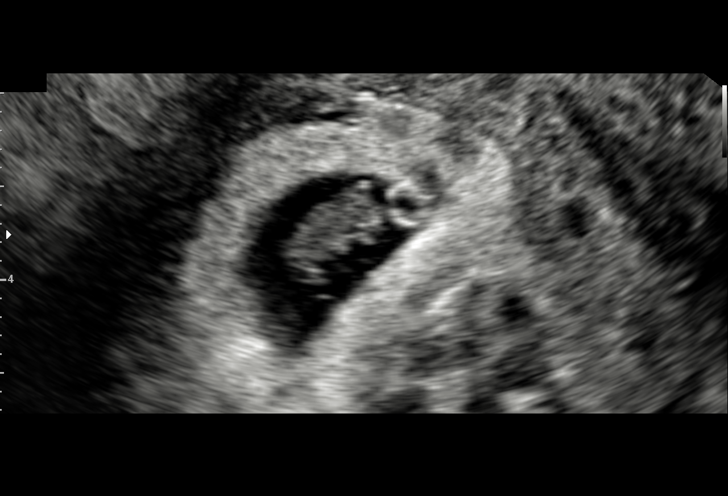
[im 41/56]
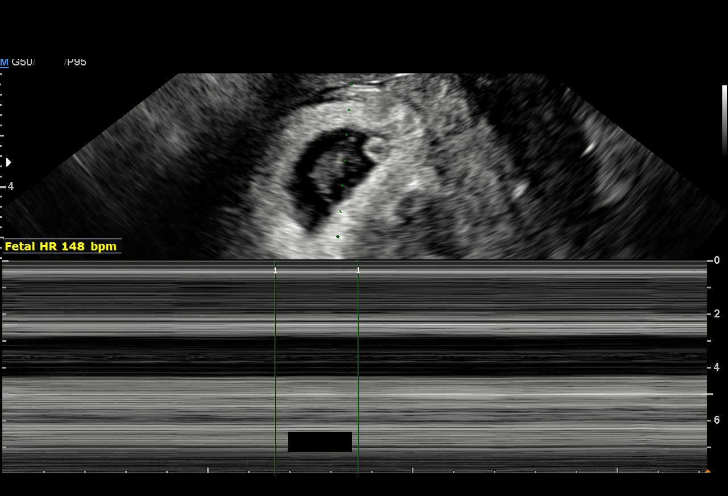
[im 45/56]
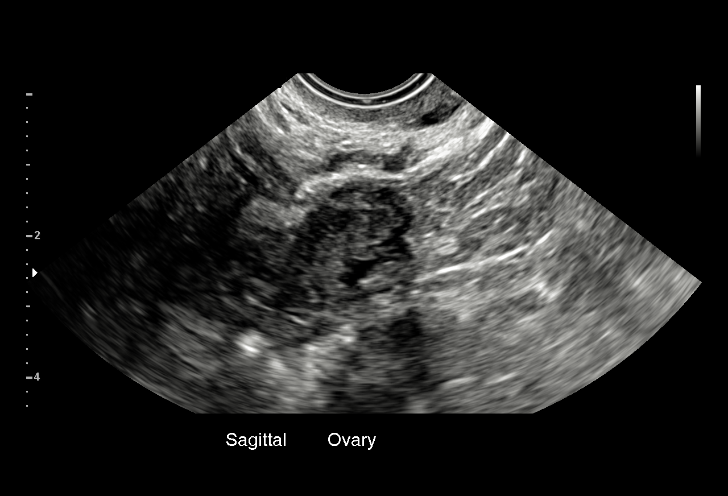
[im 49/56]
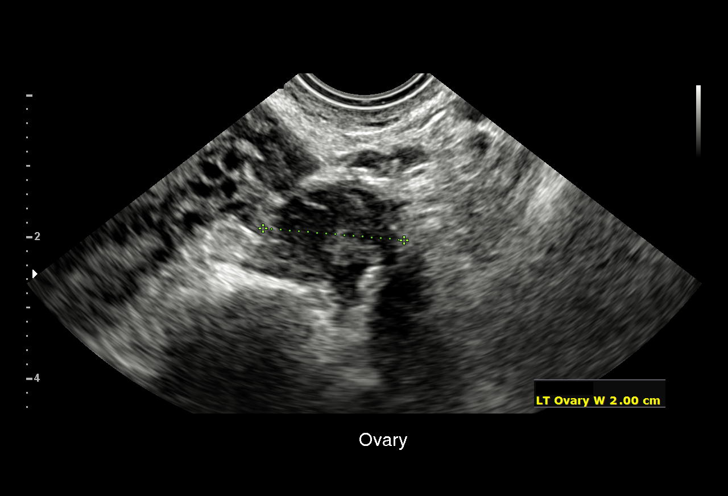
[im 53/56]
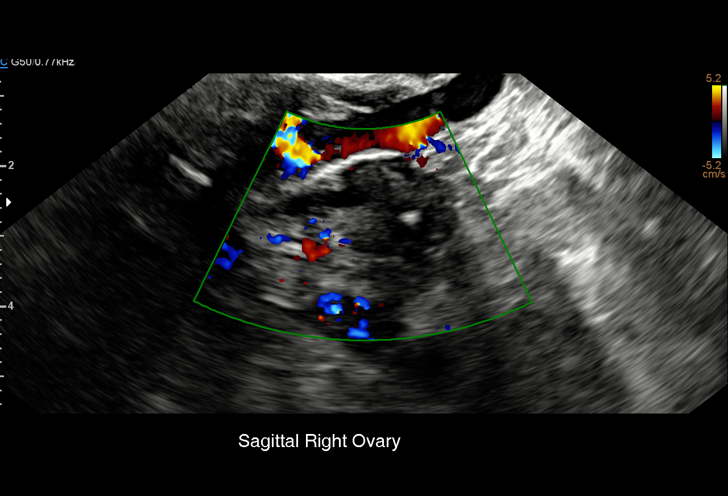

[Series 2: us ob transvaginal · 1 of 1 slices shown (2 of 2)]
[im 1/1]
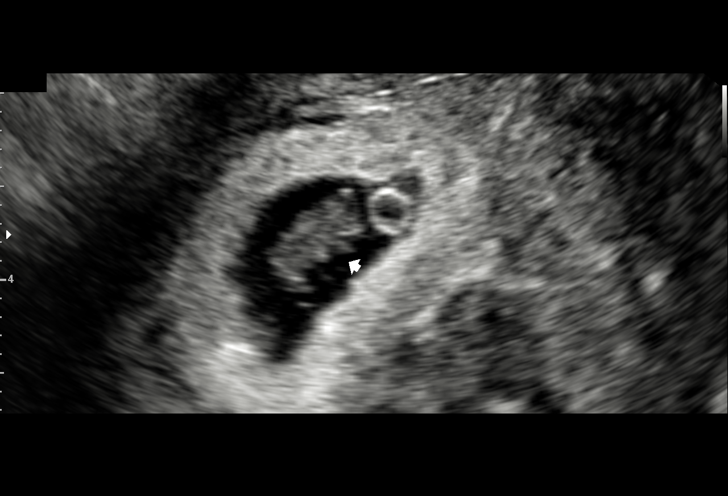

[15 of 28 positions shown; findings below may reference images not displayed]

FINDINGS: Intrauterine gestational sac: Single

Yolk sac:  Present

Embryo:  Present

Cardiac Activity: Present

Heart Rate: 148 bpm

CRL: 12 4 mm   7 w   3 d                  US EDC: 03/11/2020

Subchorionic hemorrhage: Small subchorionic hemorrhage again noted.
Similar findings noted on prior exam

Maternal uterus/adnexae: A 4.8 and 3.2 cm uterine fibroid again
noted. Ovaries appears stable. No free pelvic fluid.
IMPRESSION: 1.  Single viable intrauterine pregnancy at 7 weeks 3 days.

2. Small subchorionic hemorrhage again noted. Similar findings noted
on prior exam.

2.  Two large uterine fibroids again noted.

## 2022-03-04 IMAGING — US US MFM FETAL BPP W/ NON-STRESS
1 series · 15 of 25 positions shown · non-contrast
Comparison: none

[Series 1: us mfm fetal bpp w/ non-stress · 25 acquisitions, 15 frames shown]
[im 1/25]
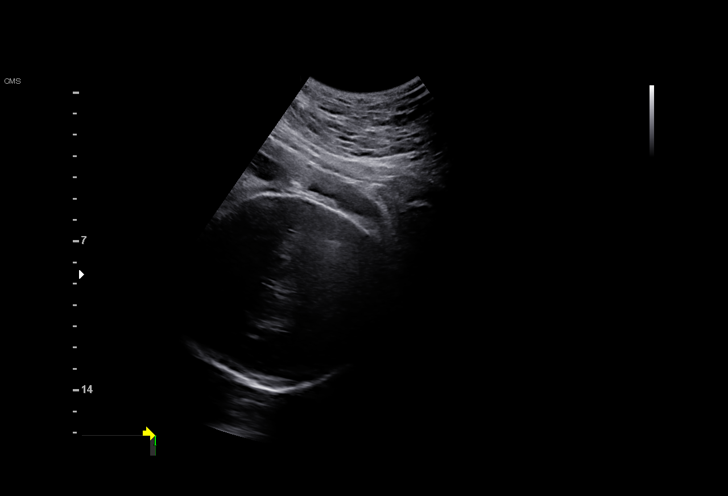
[im 3/25]
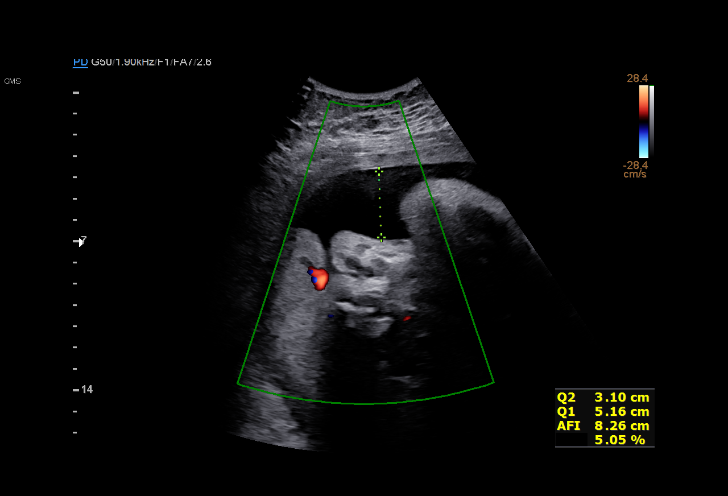
[im 5/25]
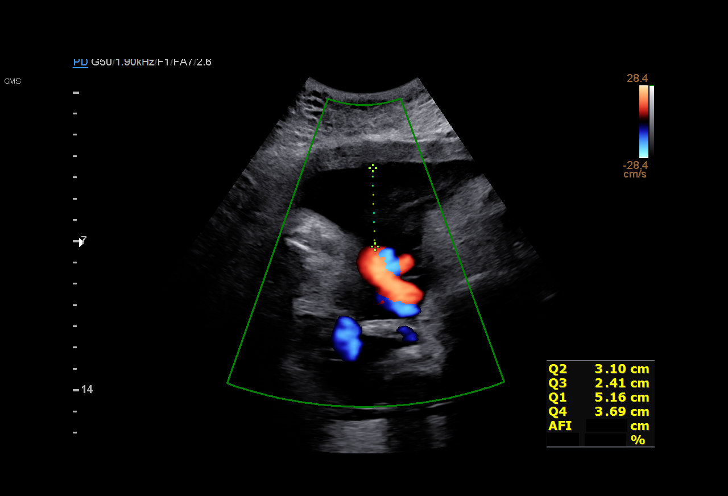
[im 6/25]
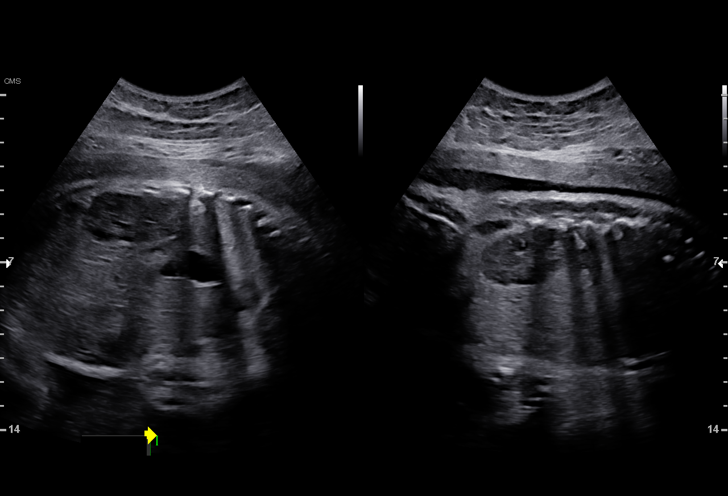
[im 8/25]
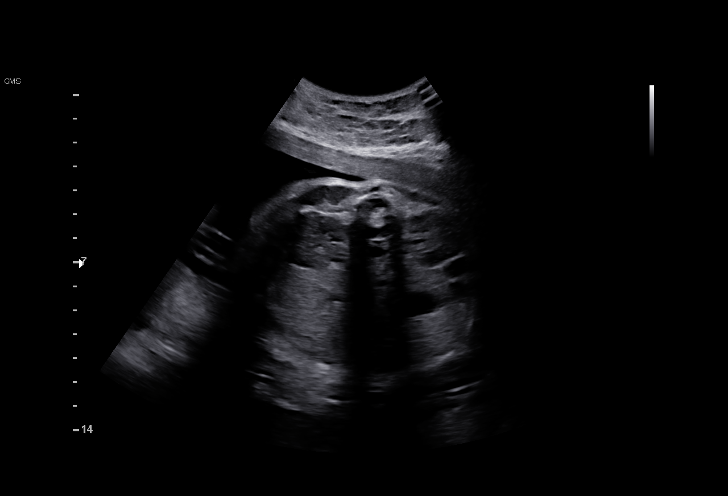
[im 10/25]
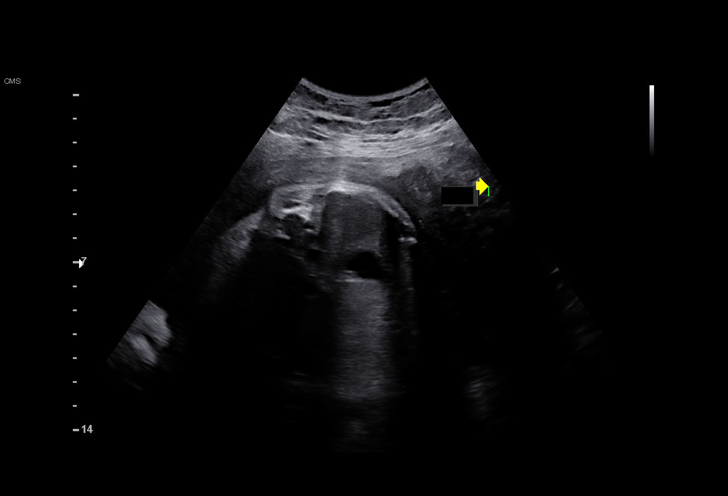
[im 11/25]
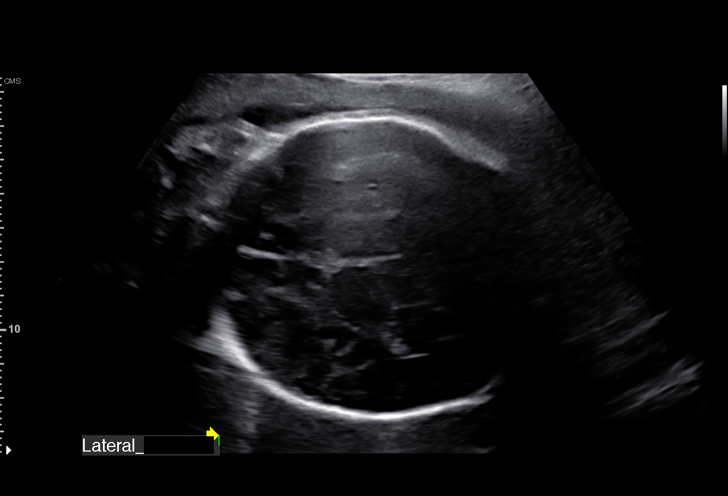
[im 13/25]
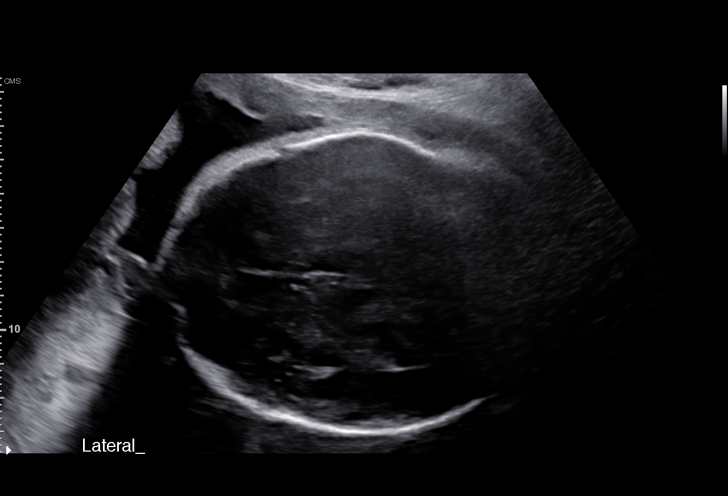
[im 15/25]
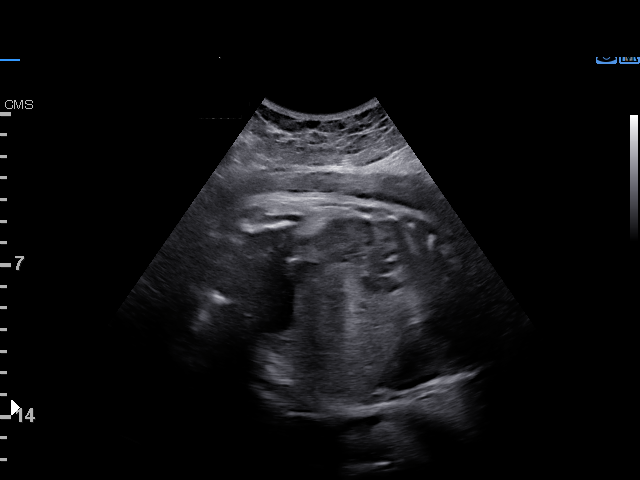
[im 16/25]
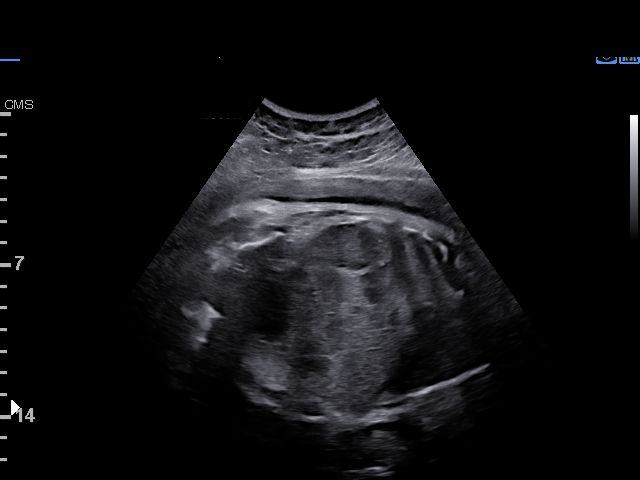
[im 18/25]
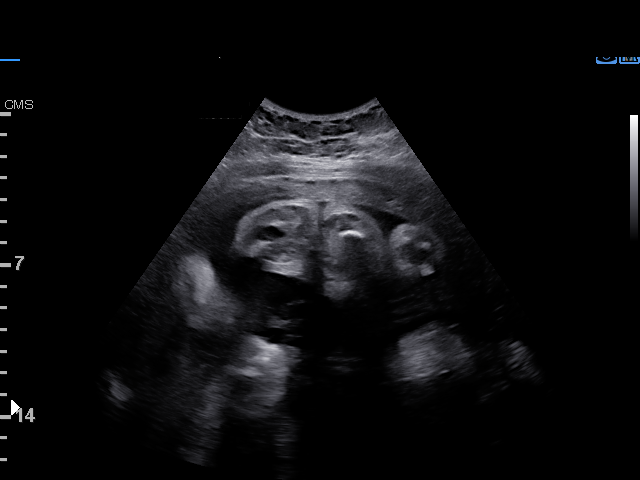
[im 20/25]
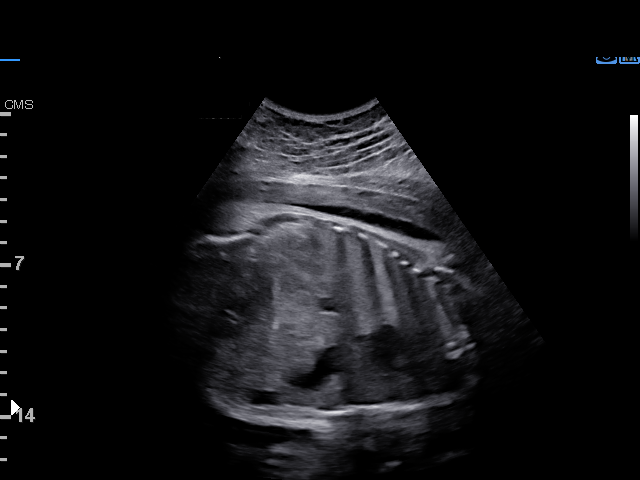
[im 21/25]
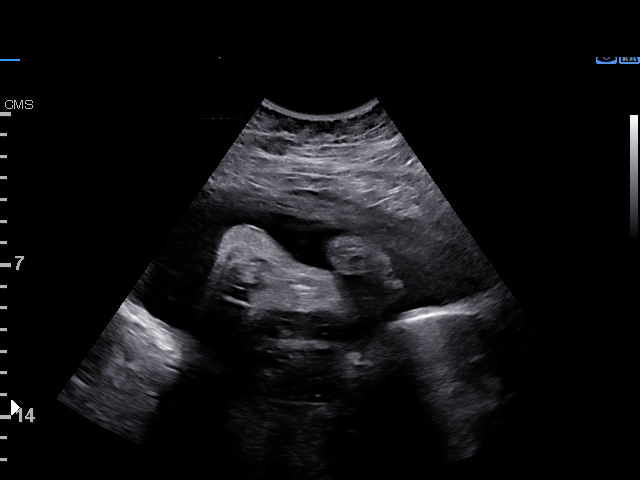
[im 23/25]
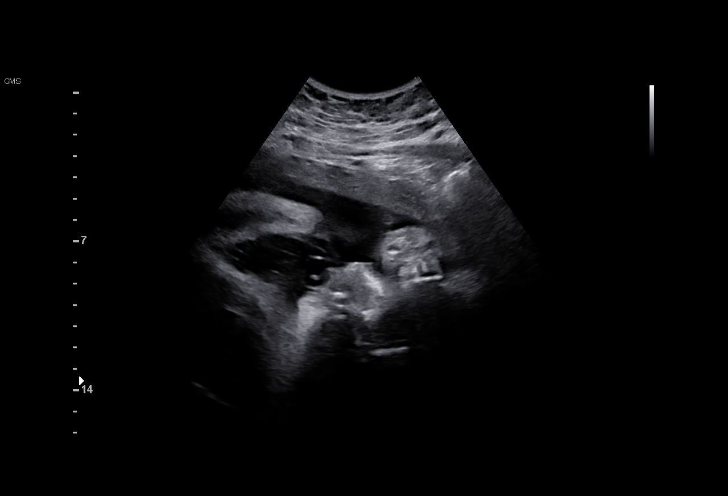
[im 25/25]
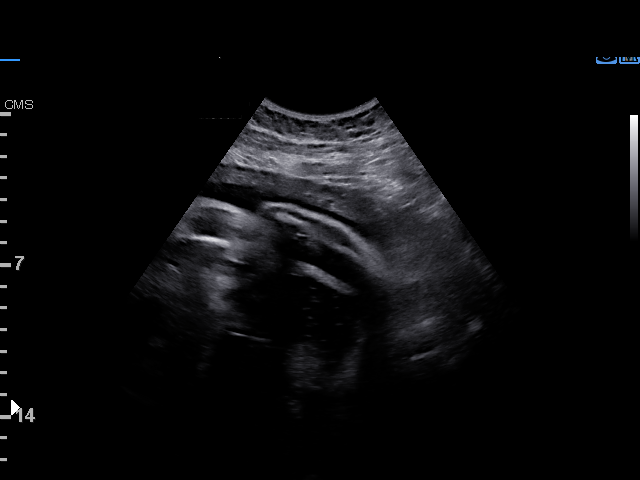

[15 of 25 positions shown; findings below may reference images not displayed]

W/NONSTRESS

Indications

 Advanced maternal age multigravida 35+,
 third trimester
 Encounter for other antenatal screening
 follow-up
 Gestational hypertension without significant
 proteinuria, third trimester
 Pregnancy resulting from assisted
 reproductive technology ( IVF donor egg,
 embryo transfer 06/24/19)
 Obesity complicating pregnancy, third
 trimester (pregravid BMI 33)
 Uterine fibroids
 Low Risk NIPS, Neg AFP, NEG Horizon
 33 weeks gestation of pregnancy
Fetal Evaluation

 Num Of Fetuses:         1
 Fetal Heart Rate(bpm):  152
 Cardiac Activity:       Observed
 Presentation:           Cephalic

 Amniotic Fluid
 AFI FV:      Within normal limits

 AFI Sum(cm)     %Tile       Largest Pocket(cm)
 14.4            51

 RUQ(cm)       RLQ(cm)       LUQ(cm)        LLQ(cm)

Biophysical Evaluation

 Amniotic F.V:   Within normal limits       F. Tone:        Observed
 F. Movement:    Observed                   N.S.T:          Reactive
 F. Breathing:   Observed                   Score:          [DATE]
Biometry

 LV:        8.4  mm
OB History

 Gravidity:    3         Term:   2
 Living:       2
Gestational Age

 Best:          33w 2d     Det. By:  Embryo Transfer          EDD:   03/12/20
                                     (06/24/19)
Cervix Uterus Adnexa

 Cervix
 Not visualized (advanced GA >83wks)
Comments

 This patient was seen for fetal testing due to very advanced
 maternal age (49 years old).  The patient's blood pressures
 today were 142/87 and 144/84.  She reports that she has
 been monitoring her blood pressures at home and some of
 her blood pressures have been in the 150s over 90s range.
 She denies any signs or symptoms of severe preeclampsia.
 A biophysical profile performed today was [DATE].  She
 also had a reactive nonstress test today making her total
 biophysical profile score [DATE].
 There was normal amniotic fluid noted on today's ultrasound
 exam.
 Preeclampsia precautions were reviewed with the patient
 today.  She was advised to continue to monitor her blood
 pressures at home and to maintain a log of her blood
 pressures.  She will bring the log in during her next visit in our
 office.

 Should her blood pressures continue to be elevated, she may
 have to undergo a preeclampsia work-up and may require
 hospitalization and should receive a complete course of
 antenatal corticosteroids.  She was given a note for work
 today to start working remotely from home.

 She was advised that the goal for her delivery should be at
 around 37 weeks.  Should her blood pressures be in the
 severe range, delivery may be considered at 34 weeks or
 greater.

 Another biophysical profile scheduled in 1 week.

## 2022-03-20 IMAGING — US US MFM FETAL BPP W/O NON-STRESS
1 series · 13 of 14 positions shown · non-contrast
Comparison: none

[Series 1: us mfm fetal bpp w/o non-stress · 14 acquisitions, 13 frames shown]
[im 1/14]
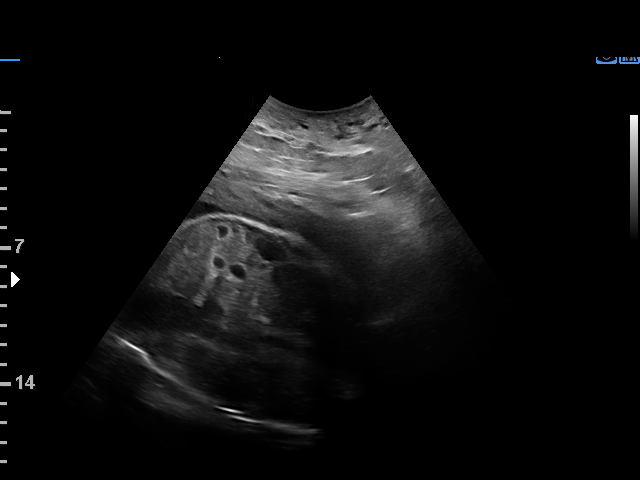
[im 2/14]
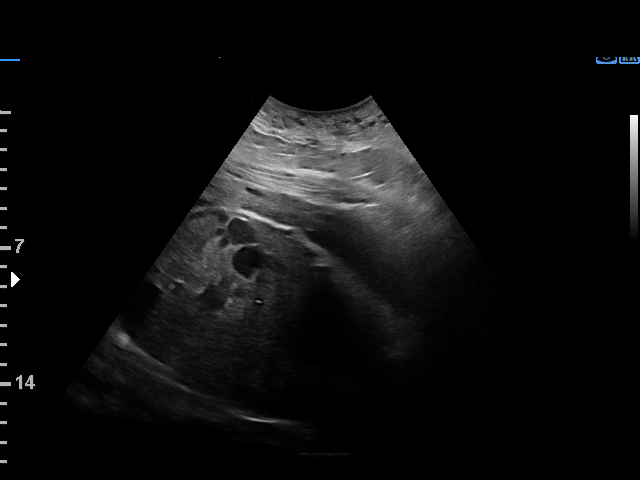
[im 3/14]
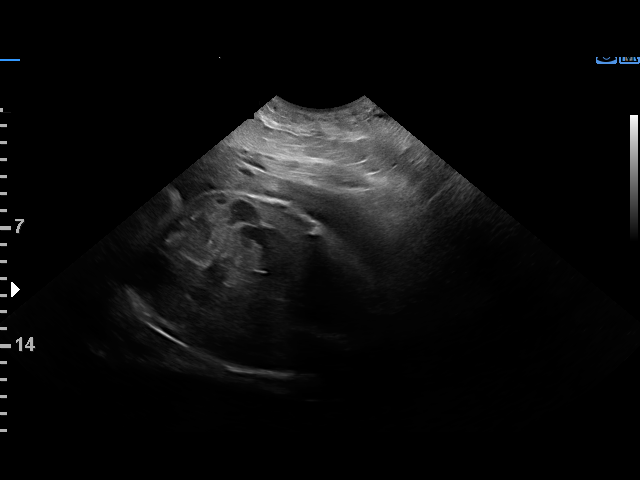
[im 4/14]
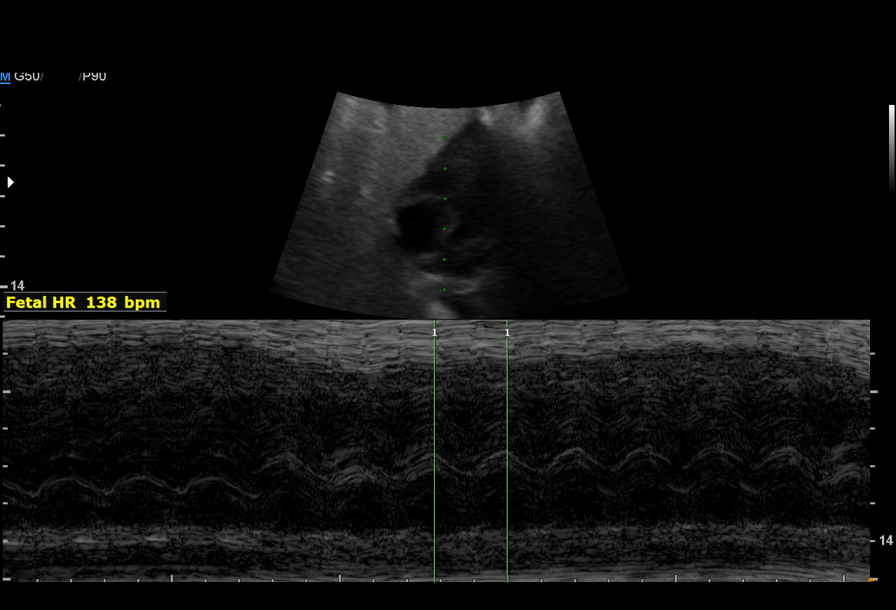
[im 5/14]
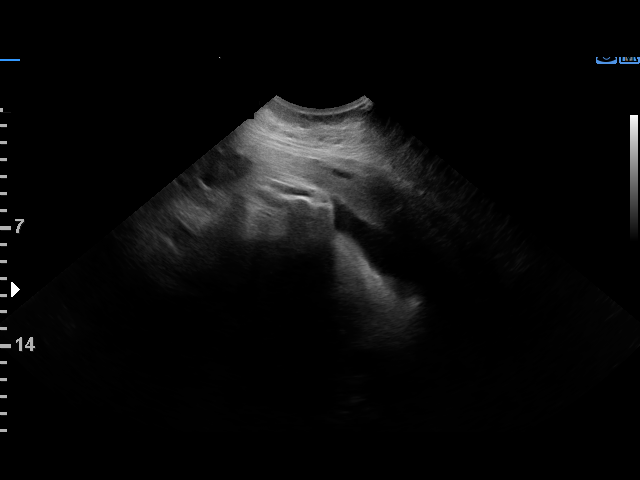
[im 6/14]
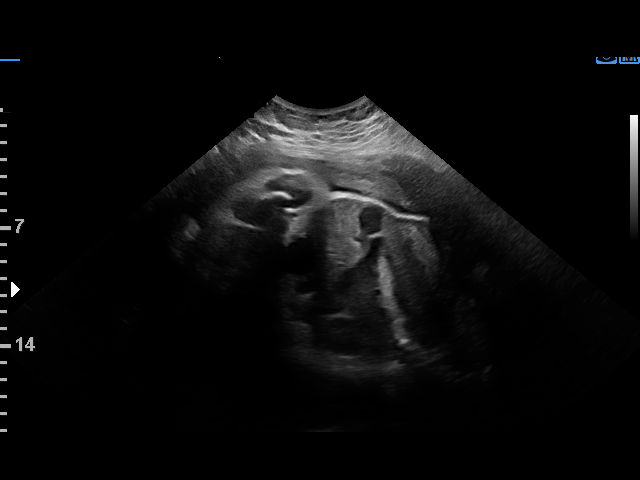
[im 8/14]
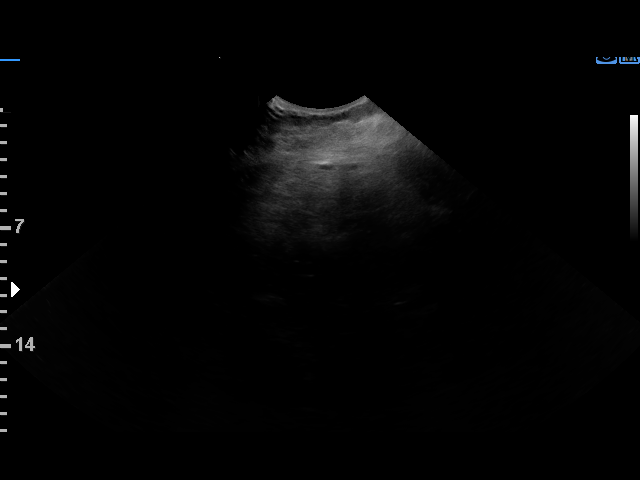
[im 9/14]
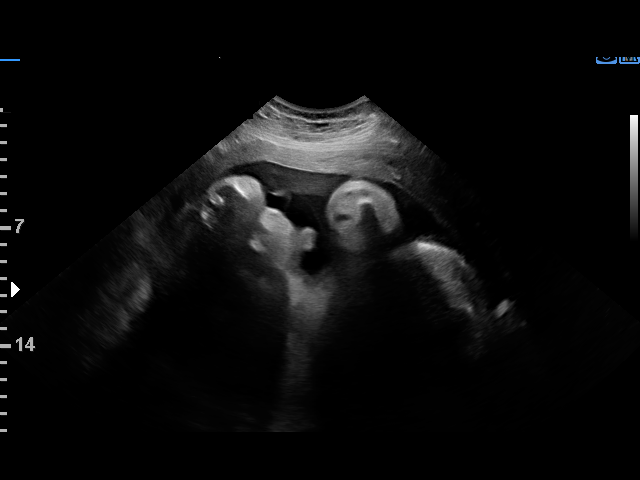
[im 10/14]
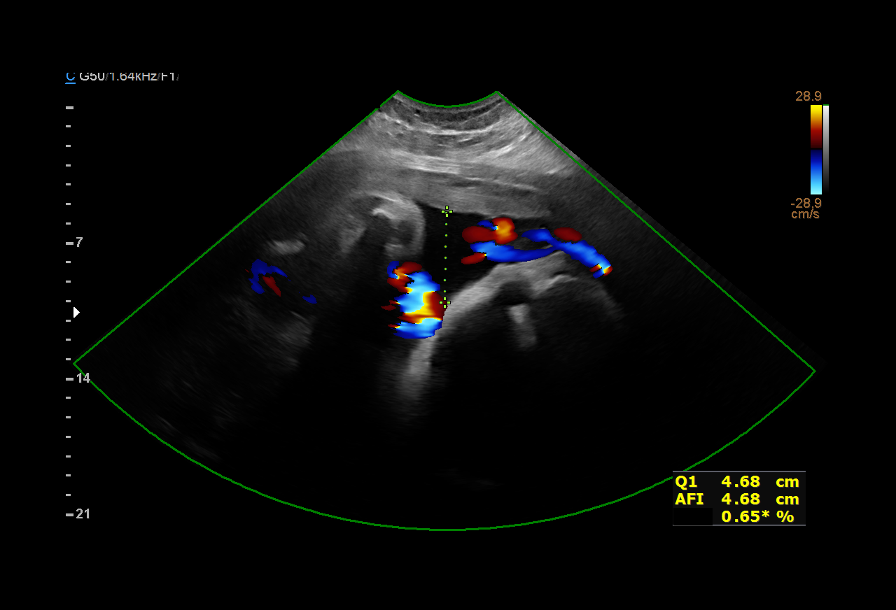
[im 11/14]
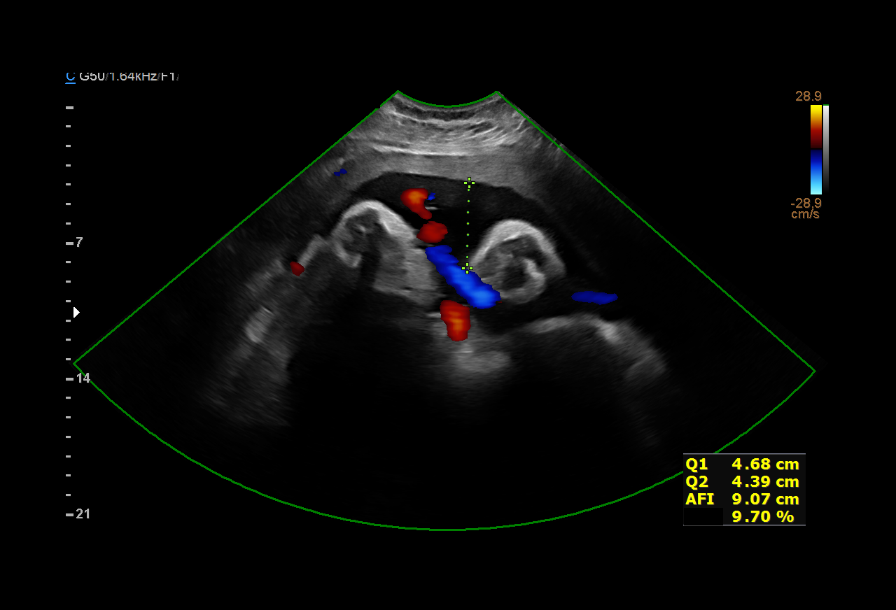
[im 12/14]
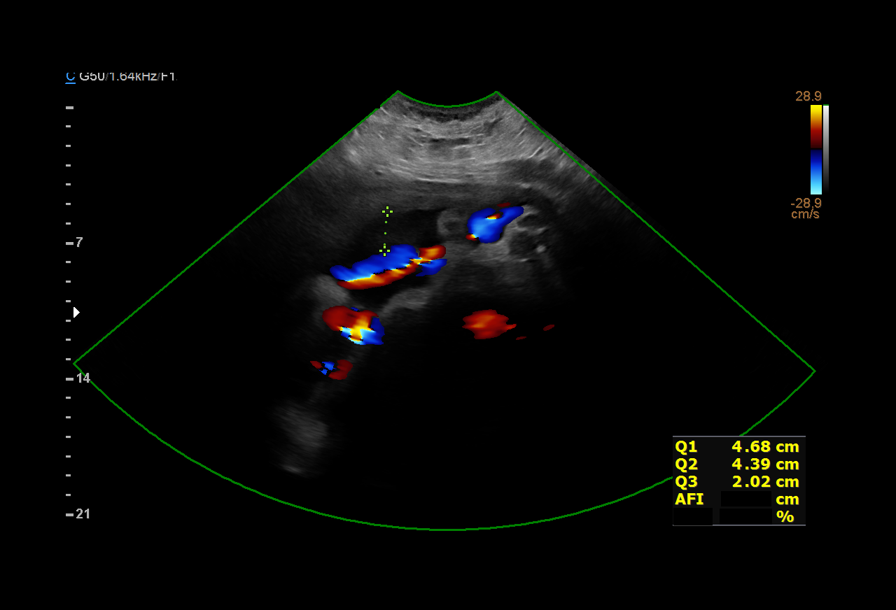
[im 13/14]
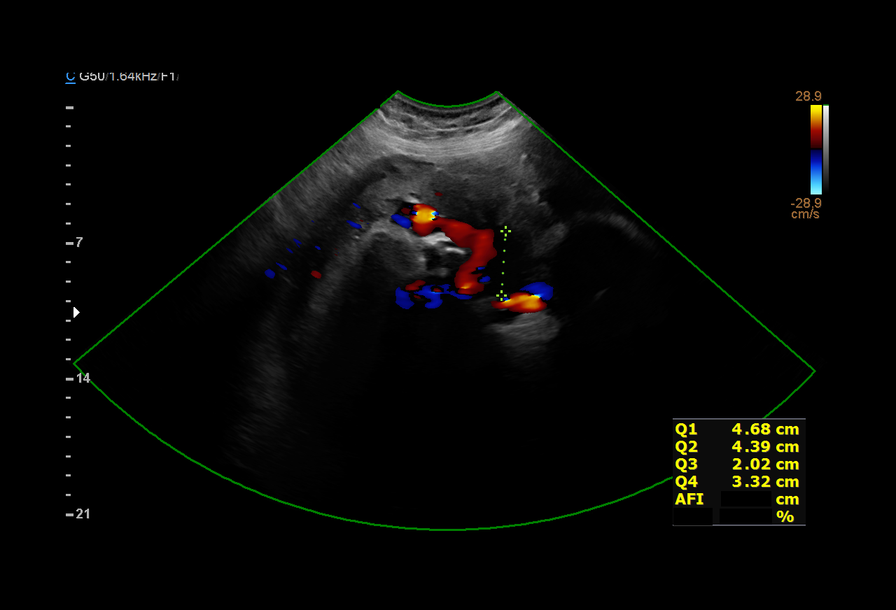
[im 14/14]
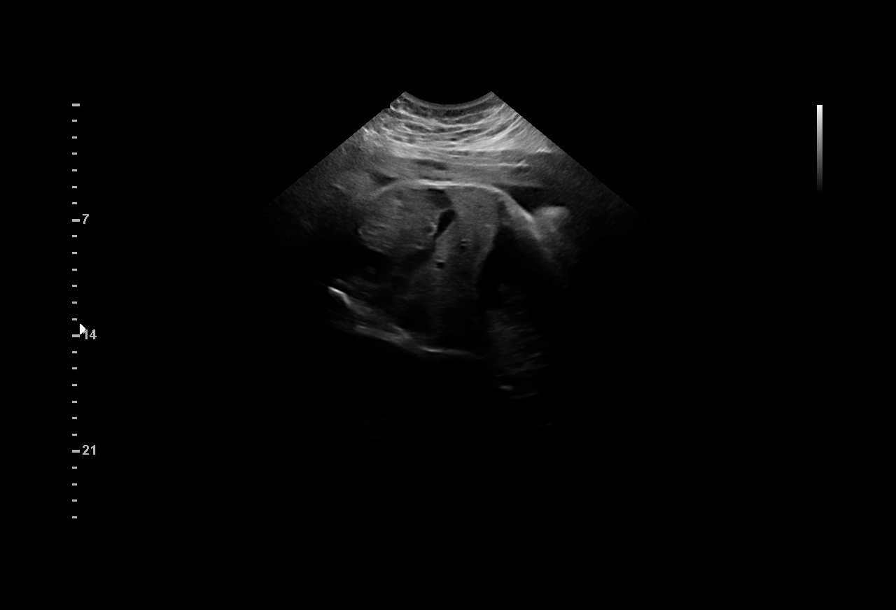

[13 of 14 positions shown; findings below may reference images not displayed]

Indications

 Decreased fetal movement
 Gestational hypertension without significant
 proteinuria, third trimester
 Advanced maternal age multigravida 35+,
 third trimester
 Pregnancy resulting from assisted
 reproductive technology ( IVF donor egg,
 embryo transfer 06/24/19)
 Obesity complicating pregnancy, third
 trimester (pregravid BMI 33)
 Uterine fibroids
 Low Risk NIPS, Neg AFP, NEG [facility]
 Encounter for other antenatal screening
 follow-up
 35 weeks gestation of pregnancy
Fetal Evaluation

 Num Of Fetuses:         1
 Fetal Heart Rate(bpm):  138
 Cardiac Activity:       Observed
 Presentation:           Cephalic
 Placenta:               Posterior
 P. Cord Insertion:      Previously Visualized

 Amniotic Fluid
 AFI FV:      Within normal limits

 AFI Sum(cm)     %Tile       Largest Pocket(cm)
 14.41           52

 RUQ(cm)       RLQ(cm)       LUQ(cm)        LLQ(cm)

Biophysical Evaluation

 Amniotic F.V:   Pocket => 2 cm             F. Tone:        Observed
 F. Movement:    Observed                   Score:          [DATE]
 F. Breathing:   Observed
OB History

 Gravidity:    3         Term:   2
 Living:       2
Gestational Age

 Best:          35w 4d     Det. By:  Embryo Transfer          EDD:   03/12/20
                                     (06/24/19)
Comments

 This patient presented to the [facility] due to gestational
 hypertension and decreased fetal movements.  She was
 observed in the hospital overnight and her fetal heart rate
 tracing was reactive.
 A biophysical profile performed today was [DATE].
 There was normal amniotic fluid noted on today's ultrasound
 exam.
 The patient will return to the [HOSPITAL] next week for
 continued fetal testing.

## 2022-12-03 DIAGNOSIS — N812 Incomplete uterovaginal prolapse: Secondary | ICD-10-CM | POA: Insufficient documentation

## 2023-02-27 DIAGNOSIS — G8918 Other acute postprocedural pain: Secondary | ICD-10-CM | POA: Insufficient documentation

## 2023-03-17 ENCOUNTER — Emergency Department (HOSPITAL_COMMUNITY)
Admission: EM | Admit: 2023-03-17 | Discharge: 2023-03-18 | Disposition: A | Discharge status: Home / Self Care | Payer: No Typology Code available for payment source | Attending: Emergency Medicine | Admitting: Emergency Medicine

## 2023-03-17 ENCOUNTER — Encounter (HOSPITAL_COMMUNITY): Payer: Self-pay | Admitting: Emergency Medicine

## 2023-03-17 ENCOUNTER — Other Ambulatory Visit: Payer: Self-pay

## 2023-03-17 ENCOUNTER — Emergency Department (HOSPITAL_COMMUNITY): Payer: No Typology Code available for payment source

## 2023-03-17 DIAGNOSIS — R059 Cough, unspecified: Secondary | ICD-10-CM | POA: Diagnosis not present

## 2023-03-17 DIAGNOSIS — R079 Chest pain, unspecified: Secondary | ICD-10-CM | POA: Insufficient documentation

## 2023-03-17 DIAGNOSIS — R0789 Other chest pain: Secondary | ICD-10-CM

## 2023-03-17 DIAGNOSIS — R0602 Shortness of breath: Secondary | ICD-10-CM | POA: Insufficient documentation

## 2023-03-17 DIAGNOSIS — R000 Tachycardia, unspecified: Secondary | ICD-10-CM | POA: Diagnosis not present

## 2023-03-17 DIAGNOSIS — R509 Fever, unspecified: Secondary | ICD-10-CM | POA: Insufficient documentation

## 2023-03-17 DIAGNOSIS — I7121 Aneurysm of the ascending aorta, without rupture: Secondary | ICD-10-CM

## 2023-03-17 DIAGNOSIS — D72829 Elevated white blood cell count, unspecified: Secondary | ICD-10-CM | POA: Insufficient documentation

## 2023-03-17 DIAGNOSIS — R1030 Lower abdominal pain, unspecified: Secondary | ICD-10-CM | POA: Insufficient documentation

## 2023-03-17 DIAGNOSIS — N3000 Acute cystitis without hematuria: Secondary | ICD-10-CM

## 2023-03-17 DIAGNOSIS — Z1152 Encounter for screening for COVID-19: Secondary | ICD-10-CM | POA: Insufficient documentation

## 2023-03-17 LAB — CBC
HCT: 34.6 % — ABNORMAL LOW (ref 36.0–46.0)
Hemoglobin: 11.3 g/dL — ABNORMAL LOW (ref 12.0–15.0)
MCH: 27.4 pg (ref 26.0–34.0)
MCHC: 32.7 g/dL (ref 30.0–36.0)
MCV: 83.8 fL (ref 80.0–100.0)
Platelets: 331 10*3/uL (ref 150–400)
RBC: 4.13 MIL/uL (ref 3.87–5.11)
RDW: 14.7 % (ref 11.5–15.5)
WBC: 12 10*3/uL — ABNORMAL HIGH (ref 4.0–10.5)
nRBC: 0 % (ref 0.0–0.2)

## 2023-03-17 LAB — BASIC METABOLIC PANEL
Anion gap: 11 (ref 5–15)
BUN: 7 mg/dL (ref 6–20)
CO2: 23 mmol/L (ref 22–32)
Calcium: 9.3 mg/dL (ref 8.9–10.3)
Chloride: 101 mmol/L (ref 98–111)
Creatinine, Ser: 0.79 mg/dL (ref 0.44–1.00)
GFR, Estimated: >60 mL/min (ref >60)
Glucose, Bld: 123 mg/dL — ABNORMAL HIGH (ref 70–99)
Potassium: 3.8 mmol/L (ref 3.5–5.1)
Sodium: 135 mmol/L (ref 135–145)

## 2023-03-17 LAB — TROPONIN I (HIGH SENSITIVITY)
Troponin I (High Sensitivity): 3 ng/L (ref <18)
Troponin I (High Sensitivity): 3 ng/L (ref <18)

## 2023-03-17 LAB — RESP PANEL BY RT-PCR (RSV, FLU A&B, COVID)  RVPGX2
Influenza A by PCR: NEGATIVE
Influenza B by PCR: NEGATIVE
Resp Syncytial Virus by PCR: NEGATIVE
SARS Coronavirus 2 by RT PCR: NEGATIVE

## 2023-03-17 MED ORDER — LACTATED RINGERS IV BOLUS
1000.0000 mL | Freq: Once | INTRAVENOUS | Status: AC
  Administered 2023-03-17: 1000 mL via INTRAVENOUS

## 2023-03-17 MED ORDER — IOHEXOL 350 MG/ML SOLN
75.0000 mL | Freq: Once | INTRAVENOUS | Status: AC | PRN
  Administered 2023-03-17: 75 mL via INTRAVENOUS

## 2023-03-17 NOTE — ED Provider Triage Note (Signed)
Emergency Medicine Provider Triage Evaluation Note  Melissa Medina , a 52 y.o. female  was evaluated in triage.  Pt complains of chest pain and shortness of breath.  Pt had a hysterectomy on 12/5  Review of Systems  Positive: Short of breath Negative: fever  Physical Exam  BP 122/74 (BP Location: Left Arm)   Pulse (!) 119   Temp 99.1 F (37.3 C)   Resp 20   Ht 5\' 5"  (1.651 m)   Wt 91.6 kg   LMP 09/26/2020   SpO2 100%   BMI 33.61 kg/m  Gen:   Awake, no distress   Resp:  Normal effort  MSK:   Moves extremities without difficulty  Other:    Medical Decision Making  Medically screening exam initiated at 7:17 PM.  Appropriate orders placed.  Sharnel Padovano was informed that the remainder of the evaluation will be completed by another provider, this initial triage assessment does not replace that evaluation, and the importance of remaining in the ED until their evaluation is complete.     Elson Areas, [place] 03/17/23 1919

## 2023-03-17 NOTE — ED Provider Notes (Signed)
[place] EMERGENCY DEPARTMENT AT [facility] Provider Note   CSN: [id number] Arrival date & time: 03/17/23  1324     History  Chief Complaint  Patient presents with   Chest Pain    Melissa Medina is a 52 y.o. female.   Chest Pain Patient presents with cough.  URI symptoms.  Chest pain and tachycardia.  Did have hysterectomy around 18 days ago.  Sounds if his hysterectomy and potentially a rectocele repair.  Patient does not know exactly what was done.  States does have some lower abdominal pain.  No dysuria.  States she has had fevers at home.  Went to urgent care and reportedly diagnosed with ear infection.     Home Medications Prior to Admission medications   Medication Sig Start Date End Date Taking? Authorizing Provider  acetaminophen (TYLENOL) 500 MG tablet Take 500 mg by mouth in the morning and at bedtime.    [provider]  Calcium Carbonate-Vit D-Min (CALCIUM 1200 PO) Take by mouth.    [provider]  Cyanocobalamin (VITAMIN B-12 PO) Take by mouth.    [provider]  diclofenac Sodium (VOLTAREN) 1 % GEL Apply 4 g topically 4 (four) times daily as needed.    [provider]  estradiol (VIVELLE-DOT) 0.05 MG/24HR patch APPLY 1 PATCH TO SKIN EVERY 3 DAYS 09/19/20   [provider]  Ferrous Gluconate (IRON 27 PO) Take by mouth.    [provider]  hydrocortisone 2.5 % ointment Apply topically 2 (two) times daily.    [provider]  ibuprofen (ADVIL) 600 MG tablet Take 600 mg by mouth 3 (three) times daily.    [provider]  ipratropium (ATROVENT) 0.03 % nasal spray Place 2 sprays into both nostrils every 12 (twelve) hours.    [provider]  Prenatal Vit-Fe Fumarate-FA (PRENATAL MULTIVITAMIN) TABS tablet Take 1 tablet by mouth daily at 12 noon.    [provider]  solifenacin (VESICARE) 5 MG tablet Take 1 tablet (5 mg total) by mouth daily. 10/18/20   Marguerita Beards, MD  vitamin C (ASCORBIC ACID) 250 MG tablet Take 500 mg by mouth 2 (two) times daily.    [provider]      Allergies    Patient has no known allergies.    Review of Systems   Review of Systems  Cardiovascular:  Positive for chest pain.    Physical Exam Updated Vital Signs BP 122/83   Pulse (!) 116   Temp 99.1 F (37.3 C)   Resp 17   Ht 5\' 5"  (1.651 m)   Wt 91.6 kg   LMP 09/26/2020   SpO2 96%   BMI 33.61 kg/m  Physical Exam Vitals and nursing note reviewed.  Cardiovascular:     Rate and Rhythm: Tachycardia present.  Pulmonary:     Breath sounds: No decreased breath sounds or wheezing.  Chest:     Chest wall: No tenderness.  Abdominal:     Tenderness: There is abdominal tenderness.     Comments: Suprapubic tenderness without mass or hernia.  Musculoskeletal:     Cervical back: Neck supple.  Skin:    General: Skin is warm.  Neurological:     Mental Status: She is alert and oriented to person, place, and time.   Bilateral TMs normal.  ED Results / Procedures / Treatments   Labs (all labs ordered are listed, but only abnormal results are displayed) Labs Reviewed  BASIC METABOLIC PANEL -  Abnormal; Notable for the following components:      Result Value   Glucose, Bld 123 (*)    All other components within normal limits  CBC - Abnormal; Notable for the following components:   WBC 12.0 (*)    Hemoglobin 11.3 (*)    HCT 34.6 (*)    All other components within normal limits  RESP PANEL BY RT-PCR (RSV, FLU A&B, COVID)  RVPGX2  TROPONIN I (HIGH SENSITIVITY)  TROPONIN I (HIGH SENSITIVITY)    EKG EKG Interpretation Date/Time:  Monday March 17 2023 18:55:37 EST Ventricular Rate:  117 PR Interval:  168 QRS Duration:  76 QT Interval:  324 QTC Calculation: 451 R Axis:   -43  Text Interpretation: Sinus tachycardia Left axis deviation Abnormal ECG When compared with ECG of 17-Oct-2016 16:42, HEART RATE INCREASED SINCE Confirmed by Benjiman Core [id number]) on 03/17/2023 8:39:17 PM  Radiology DG Chest 2 View Result Date: 03/17/2023 CLINICAL DATA:  chest pain EXAM: CHEST - 2 VIEW COMPARISON:  02/10/2020. FINDINGS: Cardiac silhouette is unremarkable. No pneumothorax or pleural effusion. The lungs are clear. The visualized skeletal structures are unremarkable. IMPRESSION: No acute cardiopulmonary process. Electronically Signed   By: Layla Maw M.D.   On: 03/17/2023 22:21    Procedures Procedures    Medications Ordered in ED Medications  iohexol (OMNIPAQUE) 350 MG/ML injection 75 mL (75 mLs Intravenous Contrast Given 03/17/23 2117)    ED Course/ Medical Decision Making/ A&P                                 Medical Decision Making Amount and/or Complexity of Data Reviewed Labs: ordered. Radiology: ordered.  Risk Prescription drug management.   Patient with cough.  Some shortness of breath.  However recent surgery.  CT scan done to evaluate for causes such as pulmonary embolism and was normal.  However also had lower abdominal pain.  Denies dysuria but did have recent surgery.  CT scan done to evaluate for potential abscess with the fever.  Does show potentially cystitis.  Will get urinalysis to evaluate.  Blood work reassuring.  White count mildly elevated.  If infection on urinalysis should be able to get antibiotics for discharge home.  If not still should be able to discharge home.  Discussed with Dr. Manus Gunning.        Final Clinical Impression(s) / ED Diagnoses Final diagnoses:  None    Rx / DC Orders ED Discharge Orders     None         Benjiman Core, MD 03/17/23 2333

## 2023-03-17 NOTE — ED Triage Notes (Signed)
Pt BIB EMS from home for complaints of chest pain, lower abd pain and nausea. Pt states 3 days ago started to get cold symptoms. Went to walk in clinic today and was dx with ear infection. Pt was prescribed antibiotic and cough medication. Pt states cough has gotten worse since being seen today and wants further evaluation for chest pain.

## 2023-03-18 ENCOUNTER — Telehealth: Payer: Self-pay

## 2023-03-18 DIAGNOSIS — R079 Chest pain, unspecified: Secondary | ICD-10-CM | POA: Diagnosis not present

## 2023-03-18 LAB — URINALYSIS, ROUTINE W REFLEX MICROSCOPIC
Bacteria, UA: NONE SEEN
Bilirubin Urine: NEGATIVE
Glucose, UA: NEGATIVE mg/dL
Ketones, ur: NEGATIVE mg/dL
Nitrite: NEGATIVE
Protein, ur: NEGATIVE mg/dL
Specific Gravity, Urine: 1.025 (ref 1.005–1.030)
pH: 6 (ref 5.0–8.0)

## 2023-03-18 MED ORDER — ACETAMINOPHEN 325 MG PO TABS
650.0000 mg | ORAL_TABLET | Freq: Once | ORAL | Status: AC
  Administered 2023-03-18: 650 mg via ORAL
  Filled 2023-03-18: qty 2

## 2023-03-18 MED ORDER — CEFADROXIL 500 MG PO CAPS: 500.0000 mg | ORAL_CAPSULE | Freq: Two times a day (BID) | ORAL | 0 refills | Status: AC

## 2023-03-18 MED ORDER — CEFTRIAXONE SODIUM 1 G IJ SOLR
1.0000 g | Freq: Once | INTRAMUSCULAR | Status: AC
  Administered 2023-03-18: 1 g via INTRAVENOUS
  Filled 2023-03-18: qty 10

## 2023-03-18 MED ORDER — LACTATED RINGERS IV BOLUS
1000.0000 mL | Freq: Once | INTRAVENOUS | Status: AC
  Administered 2023-03-18: 1000 mL via INTRAVENOUS

## 2023-03-18 NOTE — ED Notes (Signed)
Pt ambulatory to bathroom

## 2023-03-18 NOTE — Discharge Instructions (Addendum)
Take the antibiotics for urinary tract infection as prescribed.  No evidence of a heart attack or blood clot in the lung.  Follow-up with your doctor for further evaluation of aortic aneurysm which should be checked every 12 months to ensure it is not enlarging.  Return to the ED with exertional chest pain, pain associate with shortness of breath, nausea, vomiting, sweating or other concerns.

## 2023-03-18 NOTE — ED Notes (Signed)
Lab called to add on urine culture.  ?

## 2023-03-18 NOTE — Telephone Encounter (Signed)
This RNCM spoke with pharmacist at [facility] on [address] who reports [facility] has Cefadroxil in stock. This RNCM provided verbal for: cefadroxil (Duricef) 500mg  capsule, take 1 capsule po, 2 times daily for 7 days.    No additional TOC needs.

## 2023-03-18 NOTE — ED Provider Notes (Signed)
Care assumed from Dr. Rubin Payor.  Patient here with chest pain and as well as URI symptoms.  Workup negative for ACS or PE.  Waiting for urinalysis.  Patient is aware of her aortic aneurysm and need for follow-up.  UA is positive for infection.  Will treat with antibiotics.  Heart rate is improved to the 90s.  She is tolerating p.o. and ambulatory.  Denies any further chest pain or abdominal pain  Return to the ED with exertional chest pain, pain associated shortness of breath, nausea, vomiting, sweating or any concerns peer  BP (!) 107/58   Pulse 92   Temp 98.9 F (37.2 C) (Oral)   Resp 18   Ht 5\' 5"  (1.651 m)   Wt 91.6 kg   LMP 09/26/2020   SpO2 100%   BMI 33.61 kg/m     Melissa Octave, MD 03/18/23 [phone]

## 2023-03-18 NOTE — Telephone Encounter (Signed)
Received call from patient reporting she is unable to get her prescription from [facility] due to they are closed until 03/20/23. Patient requesting meds be sent to [facility] on [address]. This RNCM will call to coordinate

## 2023-03-19 LAB — URINE CULTURE: Culture: NO GROWTH

## 2023-05-26 ENCOUNTER — Ambulatory Visit: Attending: Cardiology | Admitting: Cardiology

## 2023-05-26 ENCOUNTER — Ambulatory Visit: Admitting: Cardiology

## 2023-05-26 DIAGNOSIS — R072 Precordial pain: Secondary | ICD-10-CM | POA: Insufficient documentation

## 2023-08-11 ENCOUNTER — Ambulatory Visit: Admitting: Cardiology

## 2024-01-23 ENCOUNTER — Ambulatory Visit

## 2024-01-23 ENCOUNTER — Ambulatory Visit
Admission: RE | Admit: 2024-01-23 | Discharge: 2024-01-23 | Disposition: A | Discharge status: Home / Self Care | Source: Ambulatory Visit

## 2024-01-23 VITALS — BP 131/86 | HR 107 | Temp 98.4°F | Resp 20 | Wt 203.0 lb

## 2024-01-23 DIAGNOSIS — M5090 Cervical disc disorder, unspecified, unspecified cervical region: Secondary | ICD-10-CM | POA: Insufficient documentation

## 2024-01-23 DIAGNOSIS — J069 Acute upper respiratory infection, unspecified: Secondary | ICD-10-CM

## 2024-01-23 DIAGNOSIS — R051 Acute cough: Secondary | ICD-10-CM

## 2024-01-23 LAB — POC COVID19/FLU A&B COMBO
Covid Antigen, POC: NEGATIVE
Influenza A Antigen, POC: NEGATIVE
Influenza B Antigen, POC: NEGATIVE

## 2024-01-23 MED ORDER — BENZONATATE 100 MG PO CAPS: 100.0000 mg | ORAL_CAPSULE | Freq: Three times a day (TID) | ORAL | 0 refills | Status: AC

## 2024-01-23 MED ORDER — ALBUTEROL SULFATE (2.5 MG/3ML) 0.083% IN NEBU
2.5000 mg | INHALATION_SOLUTION | Freq: Once | RESPIRATORY_TRACT | Status: AC
  Administered 2024-01-23: 2.5 mg via RESPIRATORY_TRACT

## 2024-01-23 MED ORDER — DEXAMETHASONE SOD PHOSPHATE PF 10 MG/ML IJ SOLN
10.0000 mg | Freq: Once | INTRAMUSCULAR | Status: AC
  Administered 2024-01-23: 10 mg via INTRAMUSCULAR

## 2024-01-23 NOTE — ED Triage Notes (Signed)
 Pt presents c/o URI x 3 days. Pt states,  Wednesday I started coughing a lot, chest congestion. When I cough it feels like really tight. I have no appetite.  Pt denies emesis and diarrhea.

## 2024-01-23 NOTE — ED Provider Notes (Signed)
 [facility]    CSN: [id number] Arrival date & time: 01/23/24  1444      History   Chief Complaint Chief Complaint  Patient presents with   Fever    I did not find an option for what I need to be swing I have caiging a lot of congestions - Entered by patient   URI    HPI Melissa Medina is a 53 y.o. female.   Patient presents today due to persistent cough since Wednesday.  Patient states that on Tuesday she had a procedure with a GI doctor where they put a scope down her throat to retrieve a pH monitor.  Patient states that after that procedure she has been experiencing persistent coughing and she has difficulty with eating and drinking because it causes her to cough.  Patient denies nausea or vomiting.  Patient is sipping water but denies use of any medication for symptoms.   Fever URI Presenting symptoms: fever     Past Medical History:  Diagnosis Date   Abnormal uterine bleeding 03/23/2019   Anemia    Anxiety    Bilateral plantar fasciitis    Chronic headaches    Fibroid    Fibromyalgia    GERD (gastroesophageal reflux disease)    Gestational diabetes    Herniated disc, cervical    History of reduction mammoplasty 03/23/2019   Iron deficiency anemia due to chronic blood loss 03/23/2019   Menometrorrhagia 03/23/2019   Migraine    Patellofemoral syndrome of left knee    Patellofemoral syndrome of right knee    Pregnancy induced hypertension    PTSD (post-traumatic stress disorder)    Pyelonephritis 03/23/2019   Sleep apnea     Patient Active Problem List   Diagnosis Date Noted   Cervical disc disorder 01/23/2024   Precordial pain 05/26/2023   Post-op pain 02/27/2023   Uterovaginal prolapse, incomplete 12/03/2022   Urinary incontinence 06/12/2020   Elevated ALT measurement 02/21/2020   Forceps delivery 02/21/2020   Type 3a perineal laceration 02/21/2020   History of gestational diabetes 01/05/2020   Obesity 08/25/2019   Acute nonintractable  headache 08/17/2019   Bilateral groin pain 08/17/2019   Bilateral fibrocystic breast changes 03/23/2019   Depression 03/23/2019   Chronic low back pain 03/23/2019   Gastroesophageal reflux disease 03/23/2019   History of cervical dysplasia 03/23/2019   Restless legs syndrome 03/23/2019   Sleep apnea 03/23/2019   Vitamin D deficiency 03/23/2019   Posttraumatic stress disorder 03/23/2019   Adjustment disorder with mixed emotional features 03/23/2019   Ankle joint pain 03/23/2019   Chest pain 03/23/2019   Corneal abrasion 03/23/2019   Disorder of refraction 03/23/2019   Dyspnea 03/23/2019   Fibrocystic breast 03/23/2019   Fibromyositis 03/23/2019   Hypersomnia 03/23/2019   Hypertrophy of breast 03/23/2019   Intracranial injury without loss of consciousness (HCC) 03/23/2019   Knee pain 03/23/2019   Mastodynia of left breast 03/23/2019   Migraine 03/23/2019   History of posttraumatic stress disorder (PTSD) 11/28/2017   Anxiety 11/21/2017   Fibromyalgia 11/21/2017   Pain syndrome, chronic 01/08/2017   Uterine fibroid 03/25/2008    Past Surgical History:  Procedure Laterality Date   APPENDECTOMY     childhood   BREAST REDUCTION SURGERY  2009   CHOLECYSTECTOMY     DILATION AND CURETTAGE OF UTERUS     LIPOSUCTION MULTIPLE BODY PARTS     TONSILLECTOMY     childhood    OB History     Gravida  3   Para  3   Term  3   Preterm      AB      Living  3      SAB      IAB      Ectopic      Multiple      Live Births  3            Home Medications    Prior to Admission medications   Medication Sig Start Date End Date Taking? Authorizing Provider  albuterol (VENTOLIN HFA) 108 (90 Base) MCG/ACT inhaler Inhale 1-2 puffs into the lungs. 02/22/22  Yes [provider]  benzonatate (TESSALON) 100 MG capsule Take 1 capsule (100 mg total) by mouth every 8 (eight) hours. 01/23/24  Yes Andra Krabbe C, PA-C  busPIRone (BUSPAR) 10 MG tablet Take 5 mg by  mouth. 12/04/21  Yes [provider]  Calcium  Carb-Cholecalciferol (OYSTER SHELL CALCIUM  W/D) 500-5 MG-MCG TABS TAKE 1 TABLET BY MOUTH TWICE A DAY WITH FOOD. GET REST OF CALCIUM  FROM FOOD - DAIRY PRODUCTS ETC. TOTAL  1200MG /D. WITH FOOD. GET REST OF CALCIUM  FROM FOOD - DAIRY PRODUCTS ETC. TOTAL   1200MG /D. 02/21/19  Yes [provider]  cephALEXin  (KEFLEX ) 250 MG capsule Take 500 mg by mouth. 06/04/23  Yes [provider]  cholecalciferol (VITAMIN D3) 25 MCG (1000 UNIT) tablet Take 4,000 Units by mouth. 07/15/17  Yes [provider]  dexamethasone  (DECADRON ) 1 MG tablet Take by mouth. 12/05/21  Yes [provider]  famotidine (PEPCID) 20 MG tablet Take 20 mg by mouth. 02/22/22  Yes [provider]  lidocaine  (LIDODERM ) 5 % Place 1 patch onto the skin. 07/15/17  Yes [provider]  lisinopril (ZESTRIL) 5 MG tablet Take 1 tablet by mouth daily. 08/23/21  Yes [provider]  methocarbamol (ROBAXIN) 750 MG tablet Take 1,500 mg by mouth. 10/22/22  Yes [provider]  pantoprazole  (PROTONIX ) 40 MG tablet 40 mg. 01/01/22  Yes [provider]  rosuvastatin (CRESTOR) 20 MG tablet 20 mg. 08/24/21  Yes [provider]  acetaminophen  (TYLENOL ) 500 MG tablet Take 500 mg by mouth in the morning and at bedtime.    [provider]  Biotin 1000 MCG CHEW Chew 1 capsule by mouth.    [provider]  botulinum toxin Type A (BOTOX) 100 units SOLR injection Inject 100 Units into the muscle.    [provider]  Calcium  Carbonate-Vit D-Min (CALCIUM  1200 PO) Take by mouth.    [provider]  Cyanocobalamin (VITAMIN B-12 PO) Take by mouth.    [provider]  diclofenac Sodium (VOLTAREN) 1 % GEL Apply 4 g topically 4 (four) times daily as needed.    [provider]  estradiol (VIVELLE-DOT) 0.05 MG/24HR patch APPLY 1 PATCH TO SKIN EVERY 3 DAYS 09/19/20   [provider]   Ferrous Gluconate (IRON 27 PO) Take by mouth.    [provider]  hydrocortisone 2.5 % ointment Apply topically 2 (two) times daily.    [provider]  ibuprofen  (ADVIL ) 600 MG tablet Take 600 mg by mouth 3 (three) times daily.    [provider]  ipratropium (ATROVENT) 0.03 % nasal spray Place 2 sprays into both nostrils every 12 (twelve) hours.    [provider]  ketorolac  (TORADOL ) 10 MG tablet Take 10 mg by mouth every 6 (six) hours as needed.    [provider]  Multiple Vitamins-Minerals (CENTRUM MINIS WOMEN  50+) TABS Take 1 tablet by mouth daily.    [provider]  Multiple Vitamins-Minerals (ESTROVEN MENOPAUSE SUPPLEMENT) TABS Take 1 tablet by mouth daily.    [provider]  omeprazole (PRILOSEC) 20 MG capsule Take 1 capsule by mouth daily.    [provider]  polyethylene glycol (MIRALAX  / GLYCOLAX ) 17 g packet Take 17 g by mouth.    [provider]  Prenatal Vit-Fe Fumarate-FA (PRENATAL MULTIVITAMIN) TABS tablet Take 1 tablet by mouth daily at 12 noon.    [provider]  rOPINIRole (REQUIP) 0.25 MG tablet Take 1 mg by mouth.    [provider]  semaglutide-weight management (WEGOVY) 0.25 MG/0.5ML SOAJ SQ injection Inject 0.25 mg into the skin.    [provider]  solifenacin  (VESICARE ) 5 MG tablet Take 1 tablet (5 mg total) by mouth daily. 10/18/20   Marilynne Rosaline SAILOR, MD  vitamin C (ASCORBIC ACID) 250 MG tablet Take 500 mg by mouth 2 (two) times daily.    [provider]    Family History Family History  Problem Relation Age of Onset   Hypertension Mother    Diabetes Mother    Kidney failure Mother    Healthy Father    Aneurysm Father        brain    Social History Social History   Tobacco Use   Smoking status: Never    Passive exposure: Never   Smokeless tobacco: Never  Vaping Use   Vaping status: Never Used  Substance Use Topics   Alcohol  use: No   Drug use: No     Allergies   Patient has no known allergies.   Review of Systems Review of Systems  Constitutional:  Positive for fever.     Physical Exam Triage Vital Signs ED Triage Vitals  Encounter Vitals Group     BP 01/23/24 1500 131/86     Girls Systolic BP Percentile --      Girls Diastolic BP Percentile --      Boys Systolic BP Percentile --      Boys Diastolic BP Percentile --      Pulse Rate 01/23/24 1500 (!) 108     Resp 01/23/24 1500 20     Temp 01/23/24 1500 98.4 F (36.9 C)     Temp Source 01/23/24 1500 Oral     SpO2 01/23/24 1500 96 %     Weight 01/23/24 1507 203 lb (92.1 kg)     Height --      Head Circumference --      Peak Flow --      Pain Score 01/23/24 1505 8     Pain Loc --      Pain Education --      Exclude from Growth Chart --    No data found.  Updated Vital Signs BP 131/86 (BP Location: Left Arm)   Pulse (!) 107   Temp 98.4 F (36.9 C) (Oral)   Resp 20   Wt 203 lb (92.1 kg)   LMP  (LMP Unknown)   SpO2 96%   BMI 33.78 kg/m   Visual Acuity Right Eye Distance:   Left Eye Distance:   Bilateral Distance:    Right Eye Near:   Left Eye Near:    Bilateral Near:     Physical Exam Vitals and nursing note reviewed.  Constitutional:      General: She is not in acute distress.    Appearance: Normal appearance. She is not  ill-appearing, toxic-appearing or diaphoretic.  Eyes:     General: No scleral icterus. Cardiovascular:     Rate and Rhythm: Normal rate and regular rhythm.     Heart sounds: Normal heart sounds.  Pulmonary:     Effort: Pulmonary effort is normal. No respiratory distress.     Breath sounds: Normal breath sounds. No wheezing or rhonchi.  Skin:    General: Skin is warm.  Neurological:     Mental Status: She is alert and oriented to person, place, and time.  Psychiatric:        Mood and Affect: Mood normal.        Behavior: Behavior normal.      UC Treatments / Results  Labs (all labs ordered  are listed, but only abnormal results are displayed) Labs Reviewed  POC COVID19/FLU A&B COMBO - Normal    EKG   Radiology DG Chest 2 View Result Date: 01/23/2024 CLINICAL DATA:  Cough EXAM: CHEST - 2 VIEW COMPARISON:  03/17/2023 FINDINGS: The heart size and mediastinal contours are within normal limits. Both lungs are clear. The visualized skeletal structures are unremarkable. IMPRESSION: No active cardiopulmonary disease. Electronically Signed   By: Luke Bun M.D.   On: 01/23/2024 16:25    Procedures Procedures (including critical care time)  Medications Ordered in UC Medications  albuterol (PROVENTIL) (2.5 MG/3ML) 0.083% nebulizer solution 2.5 mg (2.5 mg Nebulization Given 01/23/24 1532)  dexamethasone  (DECADRON ) injection 10 mg (10 mg Intramuscular Given 01/23/24 1604)    Initial Impression / Assessment and Plan / UC Course  I have reviewed the triage vital signs and the nursing notes.  Pertinent labs & imaging results that were available during my care of the patient were reviewed by me and considered in my medical decision making (see chart for details).     Patient experienced relief with in office treatments.  Patient diagnosed with viral URI, chest x-ray looks normal, patient given Tessalon for cough and instructions on supportive treatment for viral illness. Final Clinical Impressions(s) / UC Diagnoses   Final diagnoses:  Acute cough  Viral URI     Discharge Instructions      You been diagnosed with a viral illness today. -Viruses have to run their course and medicines that are prescribed are meant to help with symptoms. - With viruses usually feel poorly from 3 to 7 days with cough being the last symptoms to resolve.  -Cough can linger from days to weeks.  Antibiotics are not effective for viruses. -If your cough lasts more than 2 weeks and you are coughing so hard that you are vomiting or feel like you could pass out we need to follow-up with PCP for  further testing and evaluation. -Rest, increase water intake, may use pseudoephedrine for nasal congestion, Delsym (dextromethorphan) or honey as needed for cough, and ibuprofen  and/or Tylenol  as directed on packaging for pain and fever. -If you have hypertension you should take Coricidin or other OTC meds approved for people with high blood pressure. -You may use a spoonful of honey every 4-6 hours as needed for throat pain and cough. -Warm tea with honey and lemon are helpful for soothe throat as well.  Chloraseptic and Cepacol make a throat lozenge with numbing medication, can be purchased over-the-counter. -May also use Flonase or sinus rinse for sinus pressure or nasal congestion.  Be sure to use distilled bottled water for sinus rinses. -May use coolmist humidifier to open up nasal passages -May elevate head to assist with postnasal  drainage. -If you feel poorly (fever, fatigue, shortness of breath, nausea, etc.) for more than 10 days to be sure to follow-up with PCP or in clinic for further evaluation and additional treatments. If you experience chest pain with shortness of breath or pulse oxygen less than 95% you should report to the ER.     ED Prescriptions     Medication Sig Dispense Auth. Provider   benzonatate (TESSALON) 100 MG capsule Take 1 capsule (100 mg total) by mouth every 8 (eight) hours. 30 capsule Andra Corean BROCKS, PA-C      PDMP not reviewed this encounter.   Andra Corean BROCKS, PA-C 01/23/24 1639

## 2024-01-23 NOTE — Discharge Instructions (Signed)

## 2024-04-09 ENCOUNTER — Other Ambulatory Visit (HOSPITAL_COMMUNITY): Payer: Self-pay | Admitting: General Surgery

## 2024-04-09 DIAGNOSIS — E66812 Obesity, class 2: Secondary | ICD-10-CM

## 2024-04-16 ENCOUNTER — Encounter: Payer: Self-pay | Admitting: Dietician

## 2024-04-16 ENCOUNTER — Encounter: Attending: General Surgery | Admitting: Dietician

## 2024-04-16 VITALS — Ht 65.0 in | Wt 221.0 lb

## 2024-04-16 DIAGNOSIS — E669 Obesity, unspecified: Secondary | ICD-10-CM | POA: Insufficient documentation

## 2024-04-16 DIAGNOSIS — M797 Fibromyalgia: Secondary | ICD-10-CM | POA: Diagnosis not present

## 2024-04-16 DIAGNOSIS — Z713 Dietary counseling and surveillance: Secondary | ICD-10-CM | POA: Insufficient documentation

## 2024-04-16 NOTE — Progress Notes (Signed)
 Nutrition Assessment for Bariatric Surgery: Pre-Surgery Behavioral and Nutrition Intervention Program   Medical Nutrition Therapy  Appt Start Time: 0945    End Time: 1055  Patient was seen on 04/16/2024 for Pre-Operative Nutrition Assessment. Purpose of todays visit  enhance perioperative outcomes along with a healthy weight maintenance   Referral stated Supervised Weight Loss (SWL) visits needed: 0  Planned surgery: Gastric By-Pass Pt expectation of surgery: relief from pain from fibra myalgia, to be more active  NUTRITION ASSESSMENT   Anthropometrics  Start weight at [facility]: 221.0 lbs (date: 04/16/2024)  Height: 63 in BMI: 36.78 kg/m2     Clinical   Pharmacotherapy: History of weight loss medication used: Tzhncb,   Medical hx: fibra myalgia, reflux, obesity Medications: see EMR Labs: more labs pending Notable signs/symptoms: none noted Any previous deficiencies? No  Evaluation of Nutritional Deficiencies: Micronutrient Nutrition Focused Physical Exam: Hair: No issues observed Eyes: No issues observed Mouth: No issues observed Neck: No issues observed Nails: No issues observed Skin: No issues observed  Lifestyle & Dietary Hx Pt states if she works out, she will be in bed for a week, stating that is how bad the pain is. Pt states she lives in a two story house, stating once she comes downstairs for the day, she does not go back up, stating the pain is too much.  Pt states she works from home, stating she goes into the office once a week. Pt state she gets mental health treatment from the [place], and out side sources as well. Pt states she cuts back on high acid foods. Pt is motivated to work on multiple goals at a time.  Current Physical Activity Recommendations state 150 minutes per week of moderate to vigorous movement including Cardio and 1-2 days of resistance activities as well as flexibility/balance activities:  Pts current physical activity: ADL's, try walking but  painful, with 0% recommendation reached   Sleep Hygiene: duration and quality: medication, trouble falling asleep  Current Patient Perceived Stress Level as stated by pt on a scale of 1-10:  8-9       Stress Management Techniques: medication, crafts to keep mind busy  According to the Dietary Guidelines for Americans Recommendation: equivalent 1.5-2 cups fruits per day, equivalent 2-3 cups vegetables per day and at least half all grains whole  Fruit servings per day (on average): 2, meeting 100% recommendation  Non-starchy vegetable servings per day (on average): 2, meeting 66% recommendation  Whole Grains per day (on average): 0-1  Number of meals missed/skipped per week out of 21: 7  24-Hr Dietary Recall First Meal: skip/coffee Snack:  Second Meal: sandwich (turkey) Snack: jack fruit chips Third Meal: white rice, chicken, spinach or Caesar salad Snack: banana bread or peanut butter with crackers/jelly sometimes Beverages: water with flavoring, plain water, coffee  Alcoholic beverages per week: 0   Estimated Energy Needs Calories: 1500  NUTRITION DIAGNOSIS  Overweight/obesity ([place]-3.3) related to past poor dietary habits and physical inactivity as evidenced by patient w/ planned gastric by-pass surgery following dietary guidelines for continued weight loss.  NUTRITION INTERVENTION  Nutrition counseling (C-1) and education (E-2) to facilitate bariatric surgery goals.  Educated pt on micronutrient deficiencies post-surgery and behavioral/dietary strategies to start in order to mitigate that risk   Behavioral and Dietary Interventions Pre-Op Goals Reviewed with the Patient Nutrition: Healthy Eating Behaviors Switch to non-caloric, non-carbonated and non-caffeinated beverages such as  water, unsweetened tea, Crystal Light and zero calorie beverages (aim for 64 oz. per day) Cut  out grazing between meals or at night  Find a protein shake you like Eat every 3-5 hours         Eliminate distractions while eating (TV, computer, reading, driving, texting) Take 79-69 minutes to eat a meal  Decrease high sugar foods/decrease high fat/fried foods Eliminate alcoholic beverages Increase protein intake (eggs, fish, chicken, yogurt) before surgery Eat non starchy vegetables 2 times a day 7 days a week Eat complex carbohydrates such as whole grains and fruits Practice not drinking with meals   Behavioral Modification: Physical Activity Increase my usual daily activity (use stairs, park farther, etc.) Engage in _______________________  activity  _______ minutes ______ times per week  Other:    *Goals that are bolded indicate the pt would like to start working towards these  Handouts Provided Include  Bariatric Surgery handouts (Nutrition Visits, Pre Surgery Behavioral Change Goals, Protein Shakes Brands to Choose From, Vitamins & Mineral Supplementation)  Learning Style & Readiness for Change Teaching method utilized: Visual, Auditory, and hands on  Demonstrated degree of understanding via: Teach Back  Readiness Level: preparation Barriers to learning/adherence to lifestyle change: fibra myalgia (pain)  RD's Notes for Next Visit    MONITORING & EVALUATION Dietary intake, weekly physical activity, body weight, and preoperative behavioral change goals   Next Steps  Patient is to follow up at [facility] for pre-op class >2 weeks prior to scheduled surgery.

## 2024-05-07 ENCOUNTER — Other Ambulatory Visit (HOSPITAL_COMMUNITY)

## 2024-05-07 ENCOUNTER — Encounter (HOSPITAL_COMMUNITY)
